# Patient Record
Sex: Female | Born: 1945
Health system: Southern US, Community
[De-identification: ages and names within clinical notes are randomized; demographics above are authoritative.]

## PROBLEM LIST (undated history)

## (undated) DIAGNOSIS — I447 Left bundle-branch block, unspecified: Secondary | ICD-10-CM

## (undated) DIAGNOSIS — I10 Essential (primary) hypertension: Secondary | ICD-10-CM

## (undated) DIAGNOSIS — K219 Gastro-esophageal reflux disease without esophagitis: Secondary | ICD-10-CM

## (undated) DIAGNOSIS — M1990 Unspecified osteoarthritis, unspecified site: Secondary | ICD-10-CM

## (undated) DIAGNOSIS — F419 Anxiety disorder, unspecified: Secondary | ICD-10-CM

## (undated) DIAGNOSIS — E119 Type 2 diabetes mellitus without complications: Secondary | ICD-10-CM

## (undated) DIAGNOSIS — M419 Scoliosis, unspecified: Secondary | ICD-10-CM

## (undated) DIAGNOSIS — G2581 Restless legs syndrome: Secondary | ICD-10-CM

## (undated) DIAGNOSIS — F329 Major depressive disorder, single episode, unspecified: Secondary | ICD-10-CM

## (undated) DIAGNOSIS — Z9889 Other specified postprocedural states: Secondary | ICD-10-CM

## (undated) DIAGNOSIS — Z780 Asymptomatic menopausal state: Secondary | ICD-10-CM

## (undated) DIAGNOSIS — E039 Hypothyroidism, unspecified: Secondary | ICD-10-CM

## (undated) DIAGNOSIS — I509 Heart failure, unspecified: Secondary | ICD-10-CM

## (undated) DIAGNOSIS — R112 Nausea with vomiting, unspecified: Secondary | ICD-10-CM

## (undated) DIAGNOSIS — R0600 Dyspnea, unspecified: Secondary | ICD-10-CM

## (undated) DIAGNOSIS — J189 Pneumonia, unspecified organism: Secondary | ICD-10-CM

## (undated) DIAGNOSIS — N6019 Diffuse cystic mastopathy of unspecified breast: Secondary | ICD-10-CM

## (undated) DIAGNOSIS — C801 Malignant (primary) neoplasm, unspecified: Secondary | ICD-10-CM

## (undated) DIAGNOSIS — F32A Depression, unspecified: Secondary | ICD-10-CM

## (undated) HISTORY — DX: Major depressive disorder, single episode, unspecified: F32.9

## (undated) HISTORY — DX: Gastro-esophageal reflux disease without esophagitis: K21.9

## (undated) HISTORY — DX: Essential (primary) hypertension: I10

## (undated) HISTORY — DX: Anxiety disorder, unspecified: F41.9

## (undated) HISTORY — DX: Type 2 diabetes mellitus without complications: E11.9

## (undated) HISTORY — DX: Left bundle-branch block, unspecified: I44.7

## (undated) HISTORY — PX: OTHER SURGICAL HISTORY: SHX169

## (undated) HISTORY — DX: Asymptomatic menopausal state: Z78.0

## (undated) HISTORY — DX: Depression, unspecified: F32.A

## (undated) HISTORY — DX: Unspecified osteoarthritis, unspecified site: M19.90

## (undated) HISTORY — DX: Scoliosis, unspecified: M41.9

## (undated) HISTORY — PX: CATARACT EXTRACTION: SUR2

## (undated) HISTORY — PX: EYE SURGERY: SHX253

## (undated) HISTORY — PX: BREAST BIOPSY: SHX20

## (undated) HISTORY — PX: TUBAL LIGATION: SHX77

## (undated) HISTORY — DX: Hypothyroidism, unspecified: E03.9

## (undated) HISTORY — PX: TONSILLECTOMY: SUR1361

## (undated) HISTORY — DX: Diffuse cystic mastopathy of unspecified breast: N60.19

## (undated) HISTORY — PX: ROTATOR CUFF REPAIR: SHX139

## (undated) HISTORY — PX: CHOLECYSTECTOMY: SHX55

## (undated) SURGERY — COLONOSCOPY
Anesthesia: Monitor Anesthesia Care

---

## 1978-10-21 HISTORY — PX: THYROIDECTOMY: SHX17

## 1999-10-22 HISTORY — PX: KNEE ARTHROPLASTY: SHX992

## 1999-11-01 ENCOUNTER — Encounter: Payer: Self-pay | Admitting: Specialist

## 1999-11-03 ENCOUNTER — Encounter: Payer: Self-pay | Admitting: Specialist

## 1999-11-03 ENCOUNTER — Ambulatory Visit (HOSPITAL_COMMUNITY): Admission: RE | Admit: 1999-11-03 | Discharge: 1999-11-03 | Payer: Self-pay | Admitting: Specialist

## 1999-11-09 ENCOUNTER — Inpatient Hospital Stay (HOSPITAL_COMMUNITY): Admission: RE | Admit: 1999-11-09 | Discharge: 1999-11-13 | Payer: Self-pay | Admitting: Specialist

## 2000-10-21 HISTORY — PX: KNEE ARTHROPLASTY: SHX992

## 2000-11-24 ENCOUNTER — Encounter: Payer: Self-pay | Admitting: Specialist

## 2000-11-28 ENCOUNTER — Inpatient Hospital Stay (HOSPITAL_COMMUNITY): Admission: RE | Admit: 2000-11-28 | Discharge: 2000-12-02 | Payer: Self-pay | Admitting: Specialist

## 2002-07-20 ENCOUNTER — Ambulatory Visit (HOSPITAL_BASED_OUTPATIENT_CLINIC_OR_DEPARTMENT_OTHER): Admission: RE | Admit: 2002-07-20 | Discharge: 2002-07-20 | Payer: Self-pay | Admitting: Family Medicine

## 2002-09-13 ENCOUNTER — Ambulatory Visit (HOSPITAL_BASED_OUTPATIENT_CLINIC_OR_DEPARTMENT_OTHER): Admission: RE | Admit: 2002-09-13 | Discharge: 2002-09-13 | Payer: Self-pay | Admitting: Family Medicine

## 2003-08-08 ENCOUNTER — Encounter: Payer: Self-pay | Admitting: Surgery

## 2003-08-08 ENCOUNTER — Ambulatory Visit (HOSPITAL_COMMUNITY): Admission: RE | Admit: 2003-08-08 | Discharge: 2003-08-08 | Payer: Self-pay | Admitting: Surgery

## 2007-03-11 ENCOUNTER — Ambulatory Visit: Payer: Self-pay | Admitting: Internal Medicine

## 2007-03-26 ENCOUNTER — Ambulatory Visit (HOSPITAL_COMMUNITY): Admission: RE | Admit: 2007-03-26 | Discharge: 2007-03-26 | Payer: Self-pay | Admitting: Internal Medicine

## 2007-03-26 ENCOUNTER — Encounter: Payer: Self-pay | Admitting: Internal Medicine

## 2007-03-26 ENCOUNTER — Ambulatory Visit: Payer: Self-pay | Admitting: Internal Medicine

## 2007-03-26 HISTORY — PX: COLONOSCOPY: SHX174

## 2007-04-29 ENCOUNTER — Ambulatory Visit: Payer: Self-pay | Admitting: Internal Medicine

## 2007-08-04 ENCOUNTER — Ambulatory Visit: Payer: Self-pay | Admitting: Internal Medicine

## 2009-10-17 ENCOUNTER — Ambulatory Visit: Payer: Self-pay | Admitting: Cardiology

## 2011-03-05 NOTE — Assessment & Plan Note (Signed)
NAME:  Grace Delacruz, SUPAN                CHART#:  [id number]   DATE:  04/29/2007                       DOB:  September 25, 1946   Follow up pan proctocolitis on recent colonoscopy.  A 19-month history of  diarrhea with some blood per rectum.  On March 26, 2007 she underwent  colonoscopy, she had pan proctocolitis with a normal appearing terminal  ileum.  She had several biopsies revealing a chronic nonspecific  colitis.  No granulomas were seen.  She did have a couple of polyps  removed.  They turned out to be adenomatous.  We started her on 2.4 gm  of Lialda daily on April 02, 2007.  She tells me the rectal bleeding has  subsided, but she continues to have diarrhea.  As she describes, she may  have as little as 1 bowel movement on a given day, which is somewhat  soft and poorly formed, and on a bad day she may have 5 stools after  lunch.  She has gained 4 pounds since she was originally seen here.  She  is not having any nausea or vomiting.  She has vague right-sided  abdominal pain intermittently in association with bowel dysfunction.  She has not had fever or chills.  MET 7 since she was last seen  indicated normal renal function.  Blood sugar was 115 on April 06, 2007.   CURRENT MEDICATIONS:  See updated listed.   ALLERGIES:  KEFLEX.   EXAMINATION:  She appears in no acute distress.  Weight 274.5.  Height 5 feet 6 inches.  Temperature 98.  BP 130/80.  Pulse 64.  SKIN:  Warm and dry.  ABDOMEN:  Obese.  Positive bowel sounds.  Soft.  Very minimal diffuse  tenderness to palpation.  No appreciable mass or organomegaly.   ASSESSMENT:  Pan proctocolitis seen on recent colonoscopy.  She has had  meager response to low-dose Lialda.  It is notable that this lady  apparently has not any stool studies along the way although 7 months in  to this process, and given endoscopic findings, I suspect she has  idiopathic inflammatory bowel disease, i.e., ulcerative colitis.  I  think we need to go ahead and do a  set of stool studies for completion  at this time.  Not mentioned above, she was taking Advil intermittently  when I saw her initially in consultation.  I have asked her to stop all  NSAIDS, and she has complied.  We will go ahead an ask her to bump her  Lialda up to full dose therapy, i.e., four 1.2-gm capsules daily, and  ask her to take an Imodium AD 2 mg just before she eats lunch to deal  with the symptoms of diarrhea.  We will follow up on her stool studies,  and make further recommendations in the very near future.  It certainly  is possible that underlying irritable bowel  syndrome or an element of diabetic visceral enteropathy could be  complicating the picture.  Further recommendations in the very near  future.       Jonathon Bellows, M.D.  Electronically Signed     RMR/MEDQ  D:  04/29/2007  T:  04/29/2007  Job:  [id number]   cc:   [name]

## 2011-03-05 NOTE — Op Note (Signed)
NAME:  Grace Delacruz, Grace Delacruz               ACCOUNT NO.:  [id number]   MEDICAL RECORD NO.:  [id number]          PATIENT TYPE:  AMB   LOCATION:  DAY                           FACILITY:  [facility]   PHYSICIAN:  R. Roetta Sessions, M.D. DATE OF BIRTH:  [birth year]   DATE OF PROCEDURE:  03/26/2007  DATE OF DISCHARGE:                               OPERATIVE REPORT   PROCEDURE:  Colonoscopy and segmental biopsy, snare polypectomy.   INDICATIONS FOR PROCEDURE:  65 year old Caucasian female with a 34-month  history of intermittent blood per rectum.  She states her stools have  flattened out become smaller in caliber and she has some vague bilateral  lower quadrant abdominal pain.  Last colonoscopy was reportedly negative  by Dr. Marcell Anger some 6 to 7 years ago.  There is no family history  colorectal neoplasia.  Colonoscopy is now being done.  This approach has  discussed the patient at length.  Potential risks, benefits and  alternatives have been reviewed, questions answered.  She is agreeable.  Please see the medical record.  O2 saturation, blood pressure, pulse and  respirations monitored through entirety  procedure.  Conscious sedation  of Versed 4 mg IV Demerol 100 mg IV in divided doses.   INSTRUMENT:  Pentax video chip system.   FINDINGS:  Digital rectal exam revealed no abnormalities.   ENDOSCOPIC FINDINGS:  The prep was marginal, thin coating of stool  throughout the colon made the exam more difficult.  Colon:  Colonic mucosa was surveyed from the rectosigmoid junction  through the left, transverse, right colon to area of appendiceal  orifice, ileocecal valve, cecum. These structures well seen photographed  for the record.  Terminal ileum was intubated to 10 cm.  From this level  scope was slowly withdrawn.  All previously mentioned  mucosal surfaces  were again seen.  The patient has scattered left-sided diverticula.  There was 7 mL polyp at splenic flexure.  It was removed with hot snare  cautery recovered through the scope.  There was also the elliptical  ulceration on the ileocecal valve which was biopsied separately.  The  terminal ileum mucosa appeared normal.  The entire colonic mucosa  appeared abnormal with patchy loss of the normal vascular pattern,  hyperemia and mucosal edema.  These changes extended all the way down to  the rectum.  Segmental biopsies of the transverse and descending  segments and the rectal mucosa were taken for histologic study.  There  was 2 mm diminutive polyp in the rectum which was cold biopsy/removed.  Thorough examination  rectal mucosa including retroflex view anal verge  demonstrated diffusely abnormal appearing rectal mucosa similar that  seen up in the colon.  Biopsies the rectal mucosa taken for histologic  study.  The patient tolerated the procedure well was reacted in  endoscopy.   IMPRESSION:  Diffuse changes of proctocolitis as described above,  diminutive rectal polyp, cold biopsy/removed, pedunculated polyp of the  splenic flexure hot snare removed.  Segmental biopsies the colon taken  of the ileocecal valve ulcer biopsied normal terminal ileum.   The patient has proctocolitis etiology  not well-defined at this time.  All this may be the onset inflammatory bowel disease.   RECOMMENDATIONS:  1. Will follow up on path and will then make further recommendations      in the very near future.  2. No aspirin or arthritis medication for the next 10 days.      Jonathon Bellows, M.D.  Electronically Signed     RMR/MEDQ  D:  03/26/2007  T:  03/26/2007  Job:  [id number]   cc:   [name]  Fax: [phone]

## 2011-03-05 NOTE — Consult Note (Signed)
NAME:  Grace Delacruz, Grace Delacruz               ACCOUNT NO.:  [id number]   MEDICAL RECORD NO.:  [id number]           PATIENT TYPE:  AMB   LOCATION:                                FACILITY:  [facility]   PHYSICIAN:  R. Roetta Sessions, M.D. DATE OF BIRTH:  Mar 15, 1946   DATE OF CONSULTATION:  03/11/2007  DATE OF DISCHARGE:                                 CONSULTATION   REFERRING PHYSICIAN:  [name], [place], [place].   REASON FOR CONSULTATION:  Hematochezia, change in bowel function.  Ms.  Grace Delacruz is a pleasant 65 year old Caucasian female followed  primarily by Dr. Dimas Aguas who comes over to see me complaining of a six  month history of intermittent blood per rectum.  She states she has gone  from what she would call a regular bowel movement to more flatter stools  intermixed with blood periodically.  She denies constipation or  diarrhea.  She has had some vague bilateral lower quadrant abdominal  pain not associated with stooling or eating.  It is good to know she had  a colonoscopy by Dr. Cleotis Nipper 6-7 years ago without significant  findings.  No family history of colorectal neoplasia.   PAST MEDICAL HISTORY:  1. Osteoarthritis.  2. Type 2 diabetes mellitus.  3. Hypertension.  4. Hyperlipidemia.  5. History of thyroid disease.  6. Hypothyroidism on supplementation.   PAST SURGICAL HISTORY:  1. Bilateral knee replacements, last 7 years ago.  2. Three breast biopsies for benign disease.  3. Cholecystectomy.  4. C-section.  5. Left leg vein removal.  6. Joint fusion foot.  7. Rotator cuff repair.   MEDICATIONS:  1. Metformin 500 mg daily.  2. Cozaar 100 mg daily.  3. Lasix 40 mg 2 tablets daily.  4. Effexor 75 mg daily.  5. Simvastatin 80 mg daily.  6. Metoprolol 100 mg daily.  7. Etodolac 400 mg daily.  8. Levoxyl 0.125 mg daily.  9. Multivitamin daily.  10.Calcium.  11.Vitamin D daily.  12.Osteomyelitis daily.  13.Fish oil daily.  14.Vitamin E daily.   ALLERGIES:  NO  KNOWN DRUG ALLERGIES   FAMILY HISTORY:  Mother is alive with hypertension.  Father died with  congestive heart failure and pneumonia at age 31.  There is no history  of chronic GI or liver illness.  No history GI neoplasia.   SOCIAL HISTORY:  Patient married, has one 16 year old son in good  health.  She works for [employer] in [place].  [place].  No tobacco, no  alcohol.  No illicit drugs.   REVIEW OF SYSTEMS:  No recent chest pain, dyspnea on exertion.  No  change in weight.  No fever or chills.  Occasional reflux symptoms but  no odynophagia, dysphagia, early satiety, nausea or vomiting.   PHYSICAL EXAMINATION:  GENERAL APPEARANCE:  A pleasant 65 year old lady  resting comfortably.  VITAL SIGNS:  Weight 270, height 5 feet 6, temperature 97.6, BP 126/64.  SKIN:  Warm and dry.  There is no jaundice or cutaneous stigmata of  chronic liver disease.  HEENT:  No scleral icterus.  NECK:  JVD is not prominent.  CHEST:  Lungs are clear to auscultation.  CARDIAC:  Regular rate and rhythm without murmur, gallop or rub.  BREASTS:  Examination is deferred.  ABDOMEN:  Obese, positive bowel sounds, soft, entirely nontender without  obvious mass or organomegaly.  EXTREMITIES:  She was 1 to 2+ ankle edema.  RECTAL:  Exam deferred to time of colonoscopy.   IMPRESSION:  Ms. Grace Delacruz is a pleasant 65 year old lady with a six  month history of change in bowel habits, change in stool shape and  hematochezia.  It is good to know she had a colonoscopy 7 years ago but  some time has elapsed since the last examination.   RECOMMENDATIONS:  I agree with Dr. Dimas Aguas that a colonoscopy needs to be  done now.  Potential risks, benefits and alternatives have been  reviewed, questions answered.  She is agreeable.   PLAN:  Perform colonoscopy at [facility] in the very near  future.  Further recommendations to follow.   I would  like to thank Dr. [name] for letting me see this nice lady  today.      Jonathon Bellows, M.D.  Electronically Signed     RMR/MEDQ  D:  03/11/2007  T:  03/11/2007  Job:  [id number]   cc:   [name]  Fax: [phone]

## 2011-03-05 NOTE — Assessment & Plan Note (Signed)
NAME:  Grace Delacruz, Grace Delacruz                CHART#:  [id number]   DATE:  08/04/2007                       DOB:  August 09, 1946   Followup pan proctocolitis.  Since last being seen, stool studies came  back all negative for infection.  Lactoferrin was positive.  We bumped  her Lialda up to 4.8 grams daily.  Overall, her GI symptoms have waned.  She has 1 to 2 nearly formed bowel movements daily and blood per rectum  has tapered off, tenesmus has improved.  When she does get the urge to  have a bowel movement though she has to get to the toilet quickly.  She  is really not using Imodium as she thinks she does not really need it  anymore.  She remains morbidly obese.  She tells me she has discussed  with Dr. [name] bariatric surgery and has asked my opinion about  that approach as she feels she has failed to achieve significant weight  loss by all other modalities.   She is not taking any further nonsteroidals, at my request.  She did  have a couple adenomas in her colon for which surveillance colonoscopy  has been recommended 5 years from now.   CURRENT MEDICATIONS:  See updated list.   ALLERGIES:  KEFLEX.   EXAM:  She looks well.  Weight 277, up 2.5 pounds, height 5 feet 6  inches, temp 98.1, BP 120/94, pulse 60.  ABDOMEN:  Obese, positive bowel sounds, soft, nontender, no obvious mass  or organomegaly.  She has 1+ pitting lower extremity edema.   ASSESSMENT:  Pan proctocolitis, gaining remission on full-dose Lialda.  Renal function was normal based on BMET four months ago.   RECOMMENDATIONS:  1. Will check a CBC, sed. rate and BMET today.  2. Continue Lialda 4.8 grams daily.  3. Avoid nonsteroidal agents.  Hopefully will continue to see clinical      additional improvement with chronic Lialda therapy.  We talked      about the slightly increased risk of colorectal neoplasia after 12      to 15 years of UC and the need for relatively close interval      colonoscopies.  4. As far as  bariatric surgery is concerned, I told Ms. Spargo that      if she is interested in pursuing this further (which I think is not      really a bad idea overall), I would favor a referral      to Dr. Lily Peer at [facility].  She would      like to think about this approach and we will discuss further when      she returns to see [place] in 3 months.       Jonathon Bellows, M.D.  Electronically Signed     RMR/MEDQ  D:  08/04/2007  T:  08/04/2007  Job:  [id number]   cc:   [name]

## 2011-03-08 NOTE — Op Note (Signed)
[facility]  Patient:    Grace Delacruz, Grace Delacruz                        MRN: [id number] Proc. Date: 11/28/00 Adm. Date:  [id number] Attending:  Erasmo Leventhal                           Operative Report  PREOPERATIVE DIAGNOSIS:  Left knee end-stage osteoarthritis.  POSTOPERATIVE DIAGNOSIS:  Left knee end-stage osteoarthritis.  OPERATION:  Left total knee arthroplasty.  SURGEON:  R. Valma Cava, M.D.  ASSISTANT:   Ralene Bathe, P.A.  ANESTHESIA:  Spinal followed by epidural.  ESTIMATED BLOOD LOSS:  Less than 100 cc.  DRAINS:  2 Hemovacs.  COMPLICATIONS:  None.  TOURNIQUET TIME:  83 minutes at 375 mmHg.  DISPOSITION:  PACU, stable.  OPERATIVE IMPLANTS:  Osteonics components.  Posterior stabilized.  Size 7 femur, size 7 tibia, 15 mm tibial insert, and a 26 mm polyethylene patella. All components cemented.  OPERATIVE DETAILS:   The patient was counselled in the holding area, correctly identified, IV then started and antibiotics given.  Chart was reviewed. The patient was taken to OR where spinal epidural anesthesia was administered. Foley catheter was placed utilizing sterile technique by OR circulating nurse. Left knee examined, 7 degree flexion contracture, flexed to 110 degrees.  The left lower extremity was elevated.  Prepped with DuraPrep, draped in sterile fashion, exsanguinated with Esmarch, tourniquet inflated to 375 mmHg.  Anterior midline incision was made through the skin and subcutaneous tissue. The patient has a history of varicose veins, and several large veins were identified and coagulated.  Medial and lateral soft tissue flaps were developed.  Skin flaps were sutured posteriorly.  Medial parapatellar arthrotomy was performed.  Medial soft tissue released on the proximal tibia. The knee was then flexed.  End-stage arthritic change, tricompartmental, bone-on-bone contact, and large osteophytes.  Cruciate ligaments  were resected.  Starting hole made to distal femur.  Intramedullary rod was gently placed. Based upon preoperative x-rays and calculations, we chose a 5 degree valgus cut and took a a 10 mm cut off the distal femur.  The distal femur was found to be a size #7.  Rotational marks were made.  Distal femur was cut to fit a size #7.  Proximal tibial was inspected.   Medial and lateral meniscal remnants were removed.  Osteophytes were removed.  Tibial eminence was resected.   Proximal tibia was found to be a size #7.  Intramedullary starting hole was made.  Stab wound was utilized.  Canal was irrigated until effluent was clear.  Intramedullary rod was gently placed.  We chose a 5 degree posterior slope and took a 10 mm cut based upon the lateral side off the proximal tibia.  Posteromedial and posterolateral femoral osteophytes were removed.  Geniculates were coagulated on the medial and lateral aspects of the tibia.   Femoral trochlea was prepared in standard fashion.  At this point in time with a size #7 femur and size #7 tibia, with a 12 mm polyethylene insert, we had excellent range of motion and soft tissue balance.  Tracking was excellent as was alignment.  Rotational marks were made on proximal tibia.  A delta keel was performed in standard fashion.  Patella was found to be over 25 mm in thickness.  It was a size 26.  It was reamed to a depth of 10 mm.  Locking  holes were made, and excess bone was removed.  At this point in time, all components were cemented into place using modern cement technique, size #7 tibia, size #7 femur, with a 26 mm patella. We then did tibial trials with 12 and 15 mm, and with a 15 mm tibial trial, we had excellent range of motion and soft tissue balance on flexion and extension.  Trial was removed.  Knee was irrigated, and the 15 mm insert was then placed.  She had excellent flexion and extension and varus valgus stability.  There was some lateral tracking of  the patella.  A lateral release was performed with electrocautery, giving anatomic patellofemoral tracking.  All joint areas irrigated during closure several times.  Two Hemovac drains were placed intra-articularly.  At this time, sequential layered closure was done with Vicryl, synovium Vicryl, arthrotomy Vicryl.  Sponge was then placed. Ace wrap was wrapped around the knee, and the tourniquet was deflated.  We wanted to make sure we got meticulous hemostasis in the subcutaneous area secondary to her varicose vein history.  After hemostasis obtained, subcutaneous tissue closed with Vicryl, skin closed with staples.  Sterile dressing was applied to the knee.  Ace wrap, ice pack, knee immobilizer applied to lower extremity.   There were no complications.  Sponge and needle counts were correct.  She was awakened and taken from the operating room to PACU in stable condition.  Also note that another gram of Ancef was given intravenously as the tourniquet deflated.  The decreased surgical time helped with refraction and etc.  Mrs. Shuford assisted throughout this case. DD:  11/28/00 TD:  11/29/00 Job: [id number]

## 2011-03-08 NOTE — H&P (Signed)
[facility]  Patient:    Grace Delacruz, Grace Delacruz                        MRN: [id number] Adm. Date:  11/28/00 Attending:  R. Valma Cava, M.D. Dictator:   Alexzandrew L. Julien Girt, P.A.-C. CC:         [name], M.D., [facility],  [address], [place], [place]. [address]   History and Physical  DATE OF BIRTH:  19-Aug-1946  CHIEF COMPLAINT:  Left knee pain.  HISTORY OF PRESENT ILLNESS:  The patient is a 65 year old female well known to the [facility] and Dr. Elvera Lennox. Valma Cava having previously undergone a right total knee replacement arthroplasty approximately a year ago. She has been doing quite well with her right total knee; however, she has had continued follow-up concerning her left knee pain. She is seen to have a left knee varus malalignment with severe end-stage bone on bone tricompartmental arthritis. She states the pain is unbearable especially on weightbearing and walking. She is very uncomfortable and also has night pain. It is felt she has reached a point that she would like to have something done. Due to the fact that she has benefited from undergoing the previous knee, it is felt she would be an excellent candidate for the left total knee replacement arthroplasty. The risks and benefits of the procedure have been discussed with the patient and she has elected to proceed with surgery.  ALLERGIES:  No known drug allergies.  CURRENT MEDICATIONS:  Synthroid 0.125 mg daily, prempro 0.625 mg daily, Prozac 10 mg daily, Prilosec 20 mg daily, atenolol 50 mg daily, Hyzaar 25 mg daily, Vioxx 25 mg daily, glucosamine, chondroitin, vitamin E 1000 IU daily. Caltrate 600 mg plus D daily and vitamin D.  PAST MEDICAL HISTORY:  Hypertension, hypothyroidism, reflux disease, affective disorder with depression and anxiety. Fibrocystic breast disease, scoliosis, degenerative joint disease, menopausal.  PAST SURGICAL HISTORY:   Right total knee replacement arthroplasty, January 2001. Cholecystectomy. Recent colonoscopy, December 2001. Varicose vein stripping. Foot fusion x 2. Cesarean section. Right knee arthroscopy, breast biopsy, thyroidectomy, tonsillectomy.  SOCIAL HISTORY:  The patient is married, currently employed at [employer]. She lives in a [place] home with eight steps entering but denies the use of tobacco products or alcohol products.  FAMILY HISTORY:  Mother living age 53 in good health with a recent hospitalization for a shoulder hemiarthroplasty. Father deceased age 62 with a history of heart failure, hypertension, arthritis, and diabetes.  REVIEW OF SYSTEMS:  GENERAL:  No fever, chills, night sweats. NEURO:  No seizure, syncope, paralysis. RESPIRATORY:  No shortness of breath, productive cough, hemoptysis.  CARDIOVASCULAR:  No chest pain, angina, or orthopnea. GI: No nausea, vomiting, diarrhea or constipation. GU: No dysuria, hematuria, or discharge. MUSCULOSKELETAL:  Pertinent to the left knee found in the history of present illness.  PHYSICAL EXAMINATION:  VITAL SIGNS:  Pulse 68, respirations 16, blood pressure 148/78.  GENERAL:  The patient is a 65 year old white female, well-nourished, well-developed, who appears to be slightly overweight in no acute distress. She is a large framed female. She is alert, oriented, cooperative, and appears to be her stated age in no acute distress.  HEENT:  Normocephalic, atraumatic. Pupils are round and reactive. Oropharynx is clear. EOMs are intact.  NECK:  Supple, no carotid bruits.  CHEST:  Clear to auscultation and percussion. No rhonchi or rales.  HEART:  Regular rate and rhythm  without murmur. S1, S2 noted. No rubs, thrills, or palpitations.  ABDOMEN:  Soft, round, slightly protuberant abdomen. Bowel sounds are present. No rebound or guarding. Nontender.  RECTAL/BREASTS/GENITALIA:  Not done not pertinent to present  illness.  EXTREMITIES:  Significant to that of the left lower extremity. She has passive range of motion of the left knee with positive crepitus, tenderness, palpation of the medial and lateral joint line. Motor function is intact throughout the left lower extremity.  IMPRESSION:  1. Left knee end-stage tricompartmental osteoarthritis.  2. Status post right total knee replacement arthroplasty.  3. Hypertension.  4. Hypothyroidism.  5. Reflux disease.  6. Effective disorder with depression and anxiety.  7. Fibrocystic breast disease.  8. History of scoliosis.  9. Degenerative joint disease. 10. Menopausal.  PLAN:  The patient will be admitted to [facility] to undergo a left total knee replacement arthroplasty. The surgery will be performed by Dr. Elvera Lennox. Valma Cava. The patient has been seen by Dr. [name] preoperatively and cleared for the stated procedure. Dr. Dimas Aguas will be notified of the admission. He will be consulted if we need any medical information or medical assistance with this patient throughout the hospital course. DD:  11/25/00 TD:  11/26/00 Job: [id number]

## 2011-03-08 NOTE — Discharge Summary (Signed)
[facility]  Patient:    Grace Delacruz, Grace Delacruz                        MRN: [id number] Adm. Date:  [id number] Disc. Date: [id number] Attending:  Erasmo Leventhal Dictator:   Druscilla Brownie. Shela Nevin, P.A. CC:         [name], M.D., [facility],  [place], [place]   Discharge Summary  ADMISSION DIAGNOSES:  1. Left knee end-stage tricompartmental osteoarthritis.  2. Status post right total knee replacement arthroplasty.  3. Hypertension.  4. Hypothyroidism.  5. Reflux disease.  6. Affective disorder with depression and anxiety.  7. Fibrocystic breast disease.  8. History of scoliosis.  9. Degenerative joint disease. 10. Menopausal.  DISCHARGE DIAGNOSES:  1. Left knee end-stage tricompartmental osteoarthritis.  2. Status post right total knee replacement arthroplasty.  3. Hypertension.  4. Hypothyroidism.  5. Reflux disease.  6. Affective disorder with depression and anxiety.  7. Fibrocystic breast disease.  8. History of scoliosis.  9. Degenerative joint disease. 10. Menopausal. 11. Mild postoperative anemia.  CONSULTATIONS:  None.  OPERATIONS:  On November 28, 2000, the patient underwent left total knee replacement arthroplasty, all three components cemented.  Ralene Bathe, P.A.C., assisted.  HISTORY OF PRESENT ILLNESS:  This 65 year old who successfully underwent right total knee replacement arthroplasty has had progressive deteriorating problems concerning her left knee.  She has done very well with the right and highly desires correction of the left knee.  She has developed a various malalignment and severe end-stage bone-on-bone disease with tricompartmental arthritis, both by examination and x-ray.  It was felt the patient would benefit with surgical intervention and was admitted for the above procedure.  HOSPITAL COURSE:  The patient tolerated the surgical procedure quite well. Hemovac drain was pulled on the first  postoperative day without any difficulty.  She was placed on Coumadin protocol postoperatively for prevention of DVT.  The patient had an epidural postoperatively.  This was removed on the second postoperative day, and she began using p.o. analgesics. Physical therapy worked with her in ambulating, full weightbearing.  It was felt the patient could be maintained in her home environment, and arrangements were made for discharge.  Throughout her hospitalization and on the day of discharge, the patient was afebrile, vital signs were stable.  Neurovascular was intact to the operative extremity.  The patient was anxious to get home and pick up on her total knee therapy, so we discharged her.  LABORATORY DATA:  In the hospital hematologically showed a CBC with differential on admission completely within normal limits.  Final hemoglobin was 10.4, hematocrit was 30.5.  No transfusions.  Blood chemistries essentially within normal limits.  Urinalysis was negative for urinary tract infection.  No electrocardiogram seen on this chart.  No chest x-ray seen on this chart.  CONDITION ON DISCHARGE:  Improved and stable.  PLAN:  The patient is discharged to her home.  Her Coumadin protocol will be handled by her family physician, Dr. [name].  He is at the [facility] in [place], [place].  She will have outpatient physical therapy at [facility] in [place], [place].  FOLLOW-UP:  She will return to the center two weeks after the date of surgery.  DISCHARGE MEDICATIONS: 1. She is to continue with home medications and diet. 2. Coumadin per Dr. Dimas Aguas. 3. Percocet 5 mg #50 1-2 q.4-6h. p.r.n. pain. 4. Tylenol p.r.n. 5. Robaxin #30 with a refill  1 q.6h. p.r.n. muscle spasm.  DISCHARGE INSTRUCTIONS:  Call if any problems. DD:  12/10/00 TD:  12/11/00 Job: [id number]

## 2011-04-10 ENCOUNTER — Encounter: Payer: Self-pay | Admitting: Physical Therapy

## 2012-03-18 ENCOUNTER — Encounter: Payer: Self-pay | Admitting: Internal Medicine

## 2012-06-02 DIAGNOSIS — M25579 Pain in unspecified ankle and joints of unspecified foot: Secondary | ICD-10-CM | POA: Diagnosis not present

## 2012-06-03 ENCOUNTER — Encounter: Payer: Self-pay | Admitting: Cardiology

## 2012-06-03 DIAGNOSIS — I10 Essential (primary) hypertension: Secondary | ICD-10-CM | POA: Diagnosis not present

## 2012-06-03 DIAGNOSIS — Z01818 Encounter for other preprocedural examination: Secondary | ICD-10-CM | POA: Diagnosis not present

## 2012-06-03 DIAGNOSIS — E119 Type 2 diabetes mellitus without complications: Secondary | ICD-10-CM | POA: Diagnosis not present

## 2012-06-03 DIAGNOSIS — M2040 Other hammer toe(s) (acquired), unspecified foot: Secondary | ICD-10-CM | POA: Diagnosis not present

## 2012-06-08 SURGERY — Surgical Case
Anesthesia: *Unknown

## 2012-06-10 ENCOUNTER — Other Ambulatory Visit: Payer: Self-pay | Admitting: *Deleted

## 2012-06-10 DIAGNOSIS — M629 Disorder of muscle, unspecified: Secondary | ICD-10-CM | POA: Diagnosis not present

## 2012-06-10 DIAGNOSIS — M6240 Contracture of muscle, unspecified site: Secondary | ICD-10-CM | POA: Diagnosis not present

## 2012-06-10 DIAGNOSIS — I447 Left bundle-branch block, unspecified: Secondary | ICD-10-CM

## 2012-06-12 ENCOUNTER — Ambulatory Visit (HOSPITAL_COMMUNITY): Payer: Medicare Other | Attending: Cardiology

## 2012-06-12 DIAGNOSIS — I447 Left bundle-branch block, unspecified: Secondary | ICD-10-CM | POA: Diagnosis not present

## 2012-06-12 DIAGNOSIS — I079 Rheumatic tricuspid valve disease, unspecified: Secondary | ICD-10-CM | POA: Insufficient documentation

## 2012-06-12 DIAGNOSIS — R9431 Abnormal electrocardiogram [ECG] [EKG]: Secondary | ICD-10-CM

## 2012-06-12 DIAGNOSIS — I059 Rheumatic mitral valve disease, unspecified: Secondary | ICD-10-CM | POA: Diagnosis not present

## 2012-06-12 NOTE — Progress Notes (Signed)
Echocardiogram performed.  

## 2012-06-15 ENCOUNTER — Ambulatory Visit (HOSPITAL_COMMUNITY): Payer: Medicare Other | Attending: Internal Medicine | Admitting: Radiology

## 2012-06-15 VITALS — BP 151/99 | Ht 66.0 in | Wt 235.0 lb

## 2012-06-15 DIAGNOSIS — R9431 Abnormal electrocardiogram [ECG] [EKG]: Secondary | ICD-10-CM

## 2012-06-15 DIAGNOSIS — R0989 Other specified symptoms and signs involving the circulatory and respiratory systems: Secondary | ICD-10-CM | POA: Diagnosis not present

## 2012-06-15 DIAGNOSIS — R0609 Other forms of dyspnea: Secondary | ICD-10-CM

## 2012-06-15 DIAGNOSIS — R002 Palpitations: Secondary | ICD-10-CM | POA: Diagnosis not present

## 2012-06-15 DIAGNOSIS — E119 Type 2 diabetes mellitus without complications: Secondary | ICD-10-CM | POA: Diagnosis not present

## 2012-06-15 DIAGNOSIS — I447 Left bundle-branch block, unspecified: Secondary | ICD-10-CM

## 2012-06-15 DIAGNOSIS — I10 Essential (primary) hypertension: Secondary | ICD-10-CM | POA: Diagnosis not present

## 2012-06-15 MED ORDER — TECHNETIUM TC 99M TETROFOSMIN IV KIT
10.0000 | PACK | Freq: Once | INTRAVENOUS | Status: AC | PRN
  Administered 2012-06-15: 10 via INTRAVENOUS

## 2012-06-15 MED ORDER — REGADENOSON 0.4 MG/5ML IV SOLN
0.4000 mg | Freq: Once | INTRAVENOUS | Status: AC
  Administered 2012-06-15: 0.4 mg via INTRAVENOUS

## 2012-06-15 MED ORDER — TECHNETIUM TC 99M TETROFOSMIN IV KIT
30.0000 | PACK | Freq: Once | INTRAVENOUS | Status: AC | PRN
  Administered 2012-06-15: 30 via INTRAVENOUS

## 2012-06-15 NOTE — Progress Notes (Signed)
[facility] SITE 3 NUCLEAR MED [address] [place] [address] [phone]  Cardiology Nuclear Med Study  Grace Delacruz is a 66 y.o. female     MRN : [id number]     DOB: [birth year]  Procedure Date: 06/15/2012  Nuclear Med Background Indication for Stress Test:  Evaluation for Ischemia and Pending Surgical Clearance: Left foot surgery- Dr. Leonides Grills History:  06/12/12 ECHO Cardiac Risk Factors: Hypertension and NIDDM  Symptoms:  DOE and Palpitations   Nuclear Pre-Procedure Caffeine/Decaff Intake:  None NPO After: 7:30am   Lungs:  clear O2 Sat: 97% on room air. IV 0.9% NS with Angio Cath:  20g  IV Site: R Antecubital  IV Started by:  Stanton Kidney, EMT-P  Chest Size (in):  4 Cup Size: C  Height: 5\' 6"  (1.676 m)  Weight:  235 lb (106.595 kg)  BMI:  Body mass index is 37.93 kg/(m^2). Tech Comments:  Metoprolol held > 24 hours, per patient.    Nuclear Med Study 1 or 2 day study: 1 day  Stress Test Type:  Eugenie Birks  Reading MD: Dietrich Pates, MD  Order Authorizing Provider:  S.McDowell  Resting Radionuclide: Technetium 3m Tetrofosmin  Resting Radionuclide Dose: 11.0 mCi   Stress Radionuclide:  Technetium 86m Tetrofosmin  Stress Radionuclide Dose: 33.0 mCi           Stress Protocol Rest HR: 62 Stress HR: 82  Rest BP: 151/99 Stress BP: 191/100  Exercise Time (min): n/a METS: n/a   Predicted Max HR: 155 bpm % Max HR: 52.9 bpm Rate Pressure Product: 21308   Dose of Adenosine (mg):  n/a Dose of Lexiscan: 0.4 mg  Dose of Atropine (mg): n/a Dose of Dobutamine: n/a mcg/kg/min (at max HR)  Stress Test Technologist: Milana Na, EMT-P  Nuclear Technologist:  Domenic Polite, CNMT     Rest Procedure:  Myocardial perfusion imaging was performed at rest 45 minutes following the intravenous administration of Technetium 81m Tetrofosmin. Rest ECG: NSR - Normal EKG  Stress Procedure:  The patient received IV Lexiscan 0.4 mg over 15-seconds.  Technetium 37m  Tetrofosmin injected at 30-seconds.  There were no significant changes, sob, chest tightness, headache, and lt. headed with Lexiscan.  Quantitative spect images were obtained after a 45 minute delay. Stress ECG: No significant change from baseline ECG  QPS Raw Data Images:  Soft tissue (diaphragm, breast) surround heart. Stress Images:  Normal homogeneous uptake in all areas of the myocardium. Rest Images:  Normal homogeneous uptake in all areas of the myocardium. Subtraction (SDS):  No evidence of ischemia. Transient Ischemic Dilatation (Normal <1.22):  1.03 Lung/Heart Ratio (Normal <0.45):  0.40  Quantitative Gated Spect Images QGS EDV:  112 ml QGS ESV:  41 ml  Impression Exercise Capacity:  Lexiscan with no exercise. BP Response:  Normal blood pressure response. Clinical Symptoms:  Mild chest pain/dyspnea. ECG Impression:  No significant ST segment change suggestive of ischemia. Comparison with Prior Nuclear Study: No prior study.  Overall Impression:  Normal stress nuclear study.  LV Ejection Fraction: 63%.  LV Wall Motion:  NL LV Function; NL Wall Motion

## 2012-06-16 ENCOUNTER — Telehealth: Payer: Self-pay | Admitting: *Deleted

## 2012-06-16 NOTE — Telephone Encounter (Signed)
Message copied by Tarri Fuller on Tue Jun 16, 2012 11:35 AM ------      Message from: Jonelle Sidle      Created: Tue Jun 16, 2012 10:24 AM       Stress test result was located elsewhere, results normal indicating no ischemia with normal LV function. Does not appear that I have seen this patient in the office. She has a visit scheduled with me in [place] later this week for preoperative clearance.

## 2012-06-16 NOTE — Telephone Encounter (Signed)
pt notified of stress test results and gave me verbal understanding and aslo aware of echo results as well

## 2012-06-19 ENCOUNTER — Encounter: Payer: Self-pay | Admitting: Cardiology

## 2012-06-19 ENCOUNTER — Ambulatory Visit (INDEPENDENT_AMBULATORY_CARE_PROVIDER_SITE_OTHER): Payer: Medicare Other | Admitting: Cardiology

## 2012-06-19 VITALS — BP 133/69 | HR 65 | Ht 66.0 in | Wt 242.0 lb

## 2012-06-19 DIAGNOSIS — E119 Type 2 diabetes mellitus without complications: Secondary | ICD-10-CM | POA: Insufficient documentation

## 2012-06-19 DIAGNOSIS — I10 Essential (primary) hypertension: Secondary | ICD-10-CM | POA: Diagnosis not present

## 2012-06-19 DIAGNOSIS — Z01818 Encounter for other preprocedural examination: Secondary | ICD-10-CM | POA: Diagnosis not present

## 2012-06-19 NOTE — Assessment & Plan Note (Signed)
Followed by Dr. Howard. 

## 2012-06-19 NOTE — Assessment & Plan Note (Signed)
Her blood pressure is reasonable today. No changes made. Keep followup with Dr. Dimas Aguas.

## 2012-06-19 NOTE — Patient Instructions (Addendum)
Your physician recommends that you schedule a follow-up appointment in: As needed  

## 2012-06-19 NOTE — Progress Notes (Signed)
Clinical Summary Grace Delacruz is a 66 y.o.female referred for preoperative evaluation by Dr. Lestine Box. Her history was reviewed. She underwent foot surgery under anesthesia following ankle block back on August 21 at the [facility]. I reviewed the hand written operative note indicating presence of a left bundle branch block pattern by telemetry and reportedly ECG, neither available for review. Surgery was canceled due to this, although it sounds as if the patient was otherwise stable. The case was discussed with Dr. Elease Hashimoto by phone who recommended subsequent outpatient evaluation.  Ms. Obie denies any personal history of CAD or myocardial nfarction. I do have an ECG from 8/14 done at her primary care physician's office showing sinus rhythm with leftward axis, decreased R wave progression, normal QRS. She reports no history of arrhythmia or syncope. No history of exertional chest pain or unusual shortness of breath.  She was referred for an echocardiogram done on 8/23 indicating moderate LVH with LVEF of 55-65%, grade 1 diastolic dysfunction, paradoxical septal motion, mild left atrial enlargement. No major abnormalities. Lexiscan Myoview done on 8/26 reports no significant ST segment changes and overall normal perfusion, no clear evidence of ischemia. I reviewed these results with the patient her husband today.   No Known Allergies  Current Outpatient Prescriptions  Medication Sig Dispense Refill  . Ascorbic Acid (VITAMIN C PO) Take by mouth daily.      Marland Kitchen aspirin 325 MG tablet Take 650 mg by mouth 2 (two) times daily.      Marland Kitchen estrogen, conjugated,-medroxyprogesterone (PREMPRO) 0.625-2.5 MG per tablet Take 1 tablet by mouth daily.      Marland Kitchen FLUoxetine (PROZAC) 20 MG capsule Take 20 mg by mouth daily.      . furosemide (LASIX) 40 MG tablet Take 40 mg by mouth daily.      Marland Kitchen levothyroxine (SYNTHROID, LEVOTHROID) 112 MCG tablet Take 112 mcg by mouth daily.      Marland Kitchen losartan (COZAAR) 100  MG tablet Take 100 mg by mouth daily.      . metFORMIN (GLUCOPHAGE) 500 MG tablet Take 500 mg by mouth 2 (two) times daily with a meal.      . metoprolol succinate (TOPROL-XL) 100 MG 24 hr tablet Take 100 mg by mouth daily. Take with or immediately following a meal.      . simvastatin (ZOCOR) 80 MG tablet Take 80 mg by mouth at bedtime.        Past Medical History  Diagnosis Date  . Osteoarthritis   . Essential hypertension, benign   . Type 2 diabetes mellitus   . Hypothyroidism   . Esophageal reflux   . Depression   . Anxiety   . Fibrocystic breast disease   . Scoliosis   . DJD (degenerative joint disease)   . Menopause     Past Surgical History  Procedure Date  . Knee arthroplasty 2001    right  . Knee arthroplasty 2002    left  . Cesarean section   . Cholecystectomy   . Left leg vein resection   . Joint fusion-foot     x2  . Rotator cuff repair   . Breast biopsy   . Thyroidectomy   . Tonsillectomy     Family History  Problem Relation Age of Onset  . Hypertension Mother   . Heart failure Father     Died with pneumonia  . Hypertension Father   . Arthritis Father   . Diabetes Father     Social History  Ms. Dado reports that she has never smoked. She does not have any smokeless tobacco history on file. Ms. Winkel reports that she does not drink alcohol.  Review of Systems Ambulation limited, nonweightbearing on the left. At baseline she reports no angina, has NYHA class 1-2 dyspnea, no palpitations or syncope. No history of cardiac arrhythmia. No reported bleeding episodes. Reports compliance with her medications. Otherwise negative.  Physical Examination Filed Vitals:   06/19/12 0928  BP: 133/69  Pulse: 65   Overweight woman in no acute distress. HEENT: Conjunctiva and lids normal, oropharynx clear. Neck: Supple, no elevated JVP or carotid bruits, no thyromegaly. Lungs: Clear to auscultation, nonlabored breathing at rest. Cardiac: Regular rate and  rhythm, no S3 or significant systolic murmur, no pericardial rub. Abdomen: Soft, nontender, bowel sounds present, no guarding or rebound. Extremities: No pitting edema, distal pulses 2+. Left foot dressed. Skin: Warm and dry. Musculoskeletal: No kyphosis. Neuropsychiatric: Alert and oriented x3, affect grossly appropriate.   Problem List and Plan   Preoperative evaluation to rule out surgical contraindication No personal history of CAD, myocardial infarction, or CHF. No angina or unusual breathlessness at baseline. Recent reported left bundle branch block seen during anesthesia is of uncertain significance, could have been heart rate-related. She is on high-dose beta blocker at baseline and has tolerated this well. Subsequent ischemic evaluation is reassuring including documentation of normal LV function and no frank ischemia. No further cardiac testing is planned at this point, and she should be able to proceed with completion of her left foot surgery at an acceptable perioperative cardiac risk. May be worth considering doing this in the inpatient setting for closer monitoring and followup if needed. Our service is available to see her in consultation if the need arises.  Essential hypertension, benign Her blood pressure is reasonable today. No changes made. Keep followup with Dr. Dimas Aguas.  Type 2 diabetes mellitus Followed by Dr. Dimas Aguas.    Jonelle Sidle, M.D., F.A.C.C.

## 2012-06-19 NOTE — Assessment & Plan Note (Signed)
No personal history of CAD, myocardial infarction, or CHF. No angina or unusual breathlessness at baseline. Recent reported left bundle branch block seen during anesthesia is of uncertain significance, could have been heart rate-related. She is on high-dose beta blocker at baseline and has tolerated this well. Subsequent ischemic evaluation is reassuring including documentation of normal LV function and no frank ischemia. No further cardiac testing is planned at this point, and she should be able to proceed with completion of her left foot surgery at an acceptable perioperative cardiac risk. May be worth considering doing this in the inpatient setting for closer monitoring and followup if needed. Our service is available to see her in consultation if the need arises.

## 2012-06-29 ENCOUNTER — Ambulatory Visit (HOSPITAL_COMMUNITY): Admission: RE | Admit: 2012-06-29 | Payer: Medicare Other | Source: Ambulatory Visit

## 2012-06-29 ENCOUNTER — Encounter (HOSPITAL_COMMUNITY): Payer: Self-pay

## 2012-06-29 ENCOUNTER — Encounter (HOSPITAL_COMMUNITY)
Admission: RE | Admit: 2012-06-29 | Discharge: 2012-06-29 | Disposition: A | Discharge status: Home / Self Care | Payer: Medicare Other | Source: Ambulatory Visit | Attending: Orthopedic Surgery | Admitting: Orthopedic Surgery

## 2012-06-29 DIAGNOSIS — Z01812 Encounter for preprocedural laboratory examination: Secondary | ICD-10-CM | POA: Diagnosis not present

## 2012-06-29 DIAGNOSIS — G4730 Sleep apnea, unspecified: Secondary | ICD-10-CM | POA: Diagnosis not present

## 2012-06-29 DIAGNOSIS — M2010 Hallux valgus (acquired), unspecified foot: Secondary | ICD-10-CM | POA: Diagnosis not present

## 2012-06-29 DIAGNOSIS — Z79899 Other long term (current) drug therapy: Secondary | ICD-10-CM | POA: Diagnosis not present

## 2012-06-29 DIAGNOSIS — M2040 Other hammer toe(s) (acquired), unspecified foot: Secondary | ICD-10-CM | POA: Diagnosis not present

## 2012-06-29 DIAGNOSIS — I10 Essential (primary) hypertension: Secondary | ICD-10-CM | POA: Diagnosis not present

## 2012-06-29 DIAGNOSIS — Q66219 Congenital metatarsus primus varus, unspecified foot: Secondary | ICD-10-CM | POA: Diagnosis not present

## 2012-06-29 DIAGNOSIS — M7750 Other enthesopathy of unspecified foot: Secondary | ICD-10-CM | POA: Diagnosis not present

## 2012-06-29 DIAGNOSIS — K219 Gastro-esophageal reflux disease without esophagitis: Secondary | ICD-10-CM | POA: Diagnosis not present

## 2012-06-29 DIAGNOSIS — E119 Type 2 diabetes mellitus without complications: Secondary | ICD-10-CM | POA: Diagnosis not present

## 2012-06-29 LAB — BASIC METABOLIC PANEL
Chloride: 105 mEq/L (ref 96–112)
Creatinine, Ser: 0.81 mg/dL (ref 0.50–1.10)
GFR calc Af Amer: 86 mL/min — ABNORMAL LOW (ref >90)
Potassium: 3.5 mEq/L (ref 3.5–5.1)
Sodium: 141 mEq/L (ref 135–145)

## 2012-06-29 LAB — SURGICAL PCR SCREEN
MRSA, PCR: NEGATIVE
Staphylococcus aureus: POSITIVE — AB

## 2012-06-29 LAB — CBC
Platelets: 263 10*3/uL (ref 150–400)
RDW: 13 % (ref 11.5–15.5)
WBC: 8.5 10*3/uL (ref 4.0–10.5)

## 2012-06-29 NOTE — Pre-Procedure Instructions (Addendum)
PT HAS EKG REPORT IN EPIC FROM DR. HOWARD HER PCP-AND REVIEWED BY DR. MCDOWELL-[facility].  PT HAS CARDIOLOGY OFFICE NOTE 06/19/12 WITH CLEARANCE FOR FOOT SURGERY,  NUCLEAR STRESS TEST REPORT 06/17/12 AND ECHO REPORT 06/12/12--ALL REPORTS IN EPIC.   PT HAS CXR REPORT 06/03/12 FROM [facility] ON HER CHART. CBC, BMET WERE DONE TODAY AT [facility]. PREOP INSTRUCTIONS WERE DISCUSSED WITH PT USING TEACH BACK METHOD. DR. ROSE - ANESTHESIOLOGIST-TALKED WITH PT PREOP--AND COPIES OF PROGRSS NOTE, ANESTHESIA RECORD FROM [facility] ON PT'S CHART FROM HER SURGERY THERE ON 06/10/12. PT IS ALLERGIC TO HIBICLENS--SO SHE WAS NOT GIVEN HIBICLENS TO SHOWER WITH PREOP.

## 2012-06-29 NOTE — Patient Instructions (Addendum)
YOUR SURGERY IS SCHEDULED ON:  WED  9/11  AT 1:00 PM  REPORT TO [facility] AT: 11:00 AM      PHONE # FOR SHORT STAY IS [phone]  DO NOT EAT ANYTHING AFTER MIDNIGHT THE NIGHT BEFORE YOUR SURGERY.    NO FOOD, NO CHEWING GUM, NO MINTS, NO CANDIES, NO CHEWING TOBACCO. YOU MAY HAVE CLEAR LIQUIDS TO DRINK FROM MIDNIGHT UNTIL 7:00 AM DAY OF SURGERY -- LIKE WATER, SODA.       NOTHING TO DRINK AFTER 7:00 AM DAY OF SURGERY.  PLEASE TAKE THE FOLLOWING MEDICATIONS THE AM OF YOUR SURGERY WITH A FEW SIPS OF WATER:   PREMPRO, FLUOXETINE, LEVOTHYROXINE, METOPROLOL      IF YOU ARE DIABETIC:  DO NOT TAKE ANY DIABETIC MEDICATIONS THE AM OF YOUR SURGERY.  IF YOU TAKE INSULIN IN THE EVENINGS--PLEASE ONLY TAKE 1/2 NORMAL EVENING DOSE THE NIGHT BEFORE YOUR SURGERY.  NO INSULIN THE AM OF YOUR SURGERY.   DO NOT BRING VALUABLES, MONEY, CREDIT CARDS.  CONTACT LENS, DENTURES / PARTIALS, GLASSES SHOULD NOT BE WORN TO SURGERY AND IN MOST CASES-HEARING AIDS WILL NEED TO BE REMOVED.  BRING YOUR GLASSES CASE, ANY EQUIPMENT NEEDED FOR YOUR CONTACT LENS. FOR PATIENTS ADMITTED TO THE HOSPITAL--CHECK OUT TIME THE DAY OF DISCHARGE IS 11:00 AM.  ALL INPATIENT ROOMS ARE PRIVATE - WITH BATHROOM, TELEPHONE, TELEVISION AND WIFI INTERNET. IF YOU ARE BEING DISCHARGED THE SAME DAY OF YOUR SURGERY--YOU CAN NOT DRIVE YOURSELF HOME--AND SHOULD NOT GO HOME ALONE BY TAXI OR BUS.  NO DRIVING OR OPERATING MACHINERY FOR 24 HOURS FOLLOWING ANESTHESIA / PAIN MEDICATIONS.                              PLEASE READ OVER ANY  FACT SHEETS THAT YOU WERE GIVEN: MRSA INFORMATION

## 2012-07-01 ENCOUNTER — Encounter (HOSPITAL_COMMUNITY): Payer: Self-pay | Admitting: *Deleted

## 2012-07-01 ENCOUNTER — Encounter (HOSPITAL_COMMUNITY): Payer: Self-pay | Admitting: Anesthesiology

## 2012-07-01 ENCOUNTER — Ambulatory Visit (HOSPITAL_BASED_OUTPATIENT_CLINIC_OR_DEPARTMENT_OTHER): Admission: RE | Admit: 2012-07-01 | Payer: Medicare Other | Source: Ambulatory Visit | Admitting: Orthopedic Surgery

## 2012-07-01 ENCOUNTER — Ambulatory Visit: Admit: 2012-07-01 | Payer: Self-pay | Admitting: Orthopedic Surgery

## 2012-07-01 ENCOUNTER — Ambulatory Visit (HOSPITAL_COMMUNITY)
Admission: RE | Admit: 2012-07-01 | Discharge: 2012-07-01 | Disposition: A | Discharge status: Home / Self Care | Payer: Medicare Other | Source: Ambulatory Visit | Attending: Orthopedic Surgery | Admitting: Orthopedic Surgery

## 2012-07-01 ENCOUNTER — Encounter (HOSPITAL_COMMUNITY)
Admission: RE | Disposition: A | Discharge status: Home / Self Care | Payer: Self-pay | Source: Ambulatory Visit | Attending: Orthopedic Surgery

## 2012-07-01 ENCOUNTER — Ambulatory Visit (HOSPITAL_COMMUNITY): Payer: Medicare Other | Admitting: Anesthesiology

## 2012-07-01 ENCOUNTER — Encounter (HOSPITAL_BASED_OUTPATIENT_CLINIC_OR_DEPARTMENT_OTHER): Admission: RE | Payer: Self-pay | Source: Ambulatory Visit

## 2012-07-01 DIAGNOSIS — M25579 Pain in unspecified ankle and joints of unspecified foot: Secondary | ICD-10-CM

## 2012-07-01 DIAGNOSIS — Q66219 Congenital metatarsus primus varus, unspecified foot: Secondary | ICD-10-CM | POA: Insufficient documentation

## 2012-07-01 DIAGNOSIS — M2010 Hallux valgus (acquired), unspecified foot: Secondary | ICD-10-CM | POA: Diagnosis not present

## 2012-07-01 DIAGNOSIS — G4730 Sleep apnea, unspecified: Secondary | ICD-10-CM | POA: Insufficient documentation

## 2012-07-01 DIAGNOSIS — M7750 Other enthesopathy of unspecified foot: Secondary | ICD-10-CM | POA: Diagnosis not present

## 2012-07-01 DIAGNOSIS — M2140 Flat foot [pes planus] (acquired), unspecified foot: Secondary | ICD-10-CM | POA: Diagnosis not present

## 2012-07-01 DIAGNOSIS — M2020 Hallux rigidus, unspecified foot: Secondary | ICD-10-CM | POA: Diagnosis not present

## 2012-07-01 DIAGNOSIS — M19079 Primary osteoarthritis, unspecified ankle and foot: Secondary | ICD-10-CM | POA: Diagnosis not present

## 2012-07-01 DIAGNOSIS — M24573 Contracture, unspecified ankle: Secondary | ICD-10-CM | POA: Diagnosis not present

## 2012-07-01 DIAGNOSIS — K219 Gastro-esophageal reflux disease without esophagitis: Secondary | ICD-10-CM | POA: Insufficient documentation

## 2012-07-01 DIAGNOSIS — Z79899 Other long term (current) drug therapy: Secondary | ICD-10-CM | POA: Insufficient documentation

## 2012-07-01 DIAGNOSIS — M24576 Contracture, unspecified foot: Secondary | ICD-10-CM | POA: Diagnosis not present

## 2012-07-01 DIAGNOSIS — E119 Type 2 diabetes mellitus without complications: Secondary | ICD-10-CM | POA: Insufficient documentation

## 2012-07-01 DIAGNOSIS — M6240 Contracture of muscle, unspecified site: Secondary | ICD-10-CM | POA: Diagnosis not present

## 2012-07-01 DIAGNOSIS — Z01812 Encounter for preprocedural laboratory examination: Secondary | ICD-10-CM | POA: Insufficient documentation

## 2012-07-01 DIAGNOSIS — M12279 Villonodular synovitis (pigmented), unspecified ankle and foot: Secondary | ICD-10-CM | POA: Diagnosis not present

## 2012-07-01 DIAGNOSIS — M2040 Other hammer toe(s) (acquired), unspecified foot: Secondary | ICD-10-CM | POA: Diagnosis not present

## 2012-07-01 DIAGNOSIS — I10 Essential (primary) hypertension: Secondary | ICD-10-CM | POA: Insufficient documentation

## 2012-07-01 LAB — GLUCOSE, CAPILLARY

## 2012-07-01 SURGERY — TARSAL METATARSAL FUSION WITH WEIL OSTEOTOMY
Anesthesia: General | Site: Foot | Laterality: Left | Wound class: Clean

## 2012-07-01 SURGERY — RECONSTRUCTION, ANKLE
Anesthesia: Choice | Laterality: Left

## 2012-07-01 SURGERY — RECONSTRUCTION, ANKLE
Anesthesia: Choice | Site: Eye | Laterality: Left

## 2012-07-01 SURGERY — Surgical Case
Anesthesia: *Unknown

## 2012-07-01 MED ORDER — BUPIVACAINE HCL (PF) 0.5 % IJ SOLN
INTRAMUSCULAR | Status: AC
  Filled 2012-07-01: qty 30

## 2012-07-01 MED ORDER — OXYCODONE HCL 5 MG/5ML PO SOLN
5.0000 mg | Freq: Once | ORAL | Status: AC | PRN
  Filled 2012-07-01: qty 5

## 2012-07-01 MED ORDER — SODIUM CHLORIDE 0.9 % IV SOLN: INTRAVENOUS | Status: DC

## 2012-07-01 MED ORDER — ACETAMINOPHEN 10 MG/ML IV SOLN: 1000.0000 mg | Freq: Once | INTRAVENOUS | Status: DC | PRN

## 2012-07-01 MED ORDER — PROPOFOL 10 MG/ML IV EMUL
INTRAVENOUS | Status: DC | PRN
  Administered 2012-07-01: 25 mg via INTRAVENOUS
  Administered 2012-07-01: 150 mg via INTRAVENOUS

## 2012-07-01 MED ORDER — METHOCARBAMOL 500 MG PO TABS: 500.0000 mg | ORAL_TABLET | Freq: Three times a day (TID) | ORAL | Status: DC | PRN

## 2012-07-01 MED ORDER — METHOCARBAMOL 500 MG PO TABS: 500.0000 mg | ORAL_TABLET | Freq: Three times a day (TID) | ORAL | Status: AC

## 2012-07-01 MED ORDER — VANCOMYCIN HCL 1000 MG IV SOLR
1500.0000 mg | INTRAVENOUS | Status: AC
  Administered 2012-07-01: 1500 mg via INTRAVENOUS
  Filled 2012-07-01: qty 1500

## 2012-07-01 MED ORDER — HYDROMORPHONE HCL PF 1 MG/ML IJ SOLN: 0.2500 mg | INTRAMUSCULAR | Status: DC | PRN

## 2012-07-01 MED ORDER — GLYCOPYRROLATE 0.2 MG/ML IJ SOLN
INTRAMUSCULAR | Status: DC | PRN
  Administered 2012-07-01 (×2): 0.2 mg via INTRAVENOUS

## 2012-07-01 MED ORDER — PROMETHAZINE HCL 25 MG/ML IJ SOLN: 6.2500 mg | INTRAMUSCULAR | Status: DC | PRN

## 2012-07-01 MED ORDER — FENTANYL CITRATE 0.05 MG/ML IJ SOLN
INTRAMUSCULAR | Status: DC | PRN
  Administered 2012-07-01: 100 ug via INTRAVENOUS
  Administered 2012-07-01 (×3): 50 ug via INTRAVENOUS
  Administered 2012-07-01: 100 ug via INTRAVENOUS

## 2012-07-01 MED ORDER — LACTATED RINGERS IV SOLN
INTRAVENOUS | Status: DC | PRN
  Administered 2012-07-01: 14:00:00 via INTRAVENOUS

## 2012-07-01 MED ORDER — BUPIVACAINE HCL (PF) 0.5 % IJ SOLN
INTRAMUSCULAR | Status: DC | PRN
  Administered 2012-07-01: 10 mL

## 2012-07-01 MED ORDER — HYDROMORPHONE HCL PF 1 MG/ML IJ SOLN
INTRAMUSCULAR | Status: DC | PRN
  Administered 2012-07-01 (×2): 0.5 mg via INTRAVENOUS
  Administered 2012-07-01: 1 mg via INTRAVENOUS

## 2012-07-01 MED ORDER — MEPERIDINE HCL 50 MG/ML IJ SOLN: 6.2500 mg | INTRAMUSCULAR | Status: DC | PRN

## 2012-07-01 MED ORDER — EPHEDRINE SULFATE 50 MG/ML IJ SOLN
INTRAMUSCULAR | Status: DC | PRN
  Administered 2012-07-01 (×2): 5 mg via INTRAVENOUS

## 2012-07-01 MED ORDER — SCOPOLAMINE 1 MG/3DAYS TD PT72
MEDICATED_PATCH | TRANSDERMAL | Status: AC
  Filled 2012-07-01: qty 1

## 2012-07-01 MED ORDER — OXYCODONE-ACETAMINOPHEN 5-325 MG PO TABS: 1.0000 | ORAL_TABLET | ORAL | Status: AC | PRN

## 2012-07-01 MED ORDER — OXYCODONE HCL 5 MG PO TABS
5.0000 mg | ORAL_TABLET | Freq: Once | ORAL | Status: AC | PRN
  Administered 2012-07-01: 5 mg via ORAL
  Filled 2012-07-01: qty 1

## 2012-07-01 MED ORDER — POVIDONE-IODINE 7.5 % EX SOLN: Freq: Once | CUTANEOUS | Status: DC

## 2012-07-01 MED ORDER — ONDANSETRON HCL 4 MG/2ML IJ SOLN
INTRAMUSCULAR | Status: DC | PRN
  Administered 2012-07-01: 4 mg via INTRAVENOUS

## 2012-07-01 MED ORDER — LIDOCAINE HCL (CARDIAC) 20 MG/ML IV SOLN
INTRAVENOUS | Status: DC | PRN
  Administered 2012-07-01: 100 mg via INTRAVENOUS

## 2012-07-01 MED ORDER — SUCCINYLCHOLINE CHLORIDE 20 MG/ML IJ SOLN
INTRAMUSCULAR | Status: DC | PRN
  Administered 2012-07-01: 100 mg via INTRAVENOUS

## 2012-07-01 MED ORDER — MIDAZOLAM HCL 5 MG/5ML IJ SOLN
INTRAMUSCULAR | Status: DC | PRN
  Administered 2012-07-01: 2 mg via INTRAVENOUS

## 2012-07-01 SURGICAL SUPPLY — 73 items
BANDAGE ELASTIC 4 VELCRO ST LF (GAUZE/BANDAGES/DRESSINGS) ×1 IMPLANT
BANDAGE ELASTIC 6 VELCRO ST LF (GAUZE/BANDAGES/DRESSINGS) ×2 IMPLANT
BIT DRILL PROS QC 1.5 (BIT) ×2 IMPLANT
BLADE OSC/SAG .038X5.5 CUT EDG (BLADE) IMPLANT
BLADE OSCILLATING/SAGITTAL (BLADE) ×4
BLADE SURG 15 STRL LF DISP TIS (BLADE) ×4 IMPLANT
BLADE SURG 15 STRL SS (BLADE) ×8
BLADE SURG SZ11 CARB STEEL (BLADE) IMPLANT
BLADE SW THK.38XMED LNG THN (BLADE) IMPLANT
BNDG COHESIVE 4X5 TAN STRL (GAUZE/BANDAGES/DRESSINGS) ×1 IMPLANT
BRUSH SCRUB EZ PLAIN DRY (MISCELLANEOUS) ×2 IMPLANT
BUR EGG DIAMOND 4.0 (BURR) ×1 IMPLANT
BUR EGG ELITE 4.0 (BURR) ×2 IMPLANT
CANISTER SUCTION 1200CC (MISCELLANEOUS) ×2 IMPLANT
CLOTH BEACON ORANGE TIMEOUT ST (SAFETY) ×2 IMPLANT
COTTON STERILE ROLL (GAUZE/BANDAGES/DRESSINGS) ×2 IMPLANT
CUFF TOURN SGL QUICK 34 (TOURNIQUET CUFF) ×2
CUFF TRNQT CYL 34X4X40X1 (TOURNIQUET CUFF) ×1 IMPLANT
DRAPE LG THREE QUARTER DISP (DRAPES) ×4 IMPLANT
DRAPE OEC MINIVIEW 54X84 (DRAPES) ×2 IMPLANT
DRAPE SURG 17X11 SM STRL (DRAPES) ×2 IMPLANT
DRSG ADAPTIC 3X8 NADH LF (GAUZE/BANDAGES/DRESSINGS) ×1 IMPLANT
DRSG PAD ABDOMINAL 8X10 ST (GAUZE/BANDAGES/DRESSINGS) ×2 IMPLANT
DURA STEPPER LG (CAST SUPPLIES) IMPLANT
DURA STEPPER MED (CAST SUPPLIES) IMPLANT
DURA STEPPER SML (CAST SUPPLIES) IMPLANT
ELECT REM PT RETURN 9FT ADLT (ELECTROSURGICAL) ×2
ELECTRODE REM PT RTRN 9FT ADLT (ELECTROSURGICAL) ×1 IMPLANT
GAUZE SPONGE 4X4 16PLY XRAY LF (GAUZE/BANDAGES/DRESSINGS) IMPLANT
GAUZE XEROFORM 1X8 LF (GAUZE/BANDAGES/DRESSINGS) IMPLANT
GAUZE XEROFORM 5X9 LF (GAUZE/BANDAGES/DRESSINGS) ×1 IMPLANT
GLOVE BIO SURGEON STRL SZ8 (GLOVE) ×2 IMPLANT
GLOVE BIOGEL PI IND STRL 8 (GLOVE) ×2 IMPLANT
GLOVE BIOGEL PI INDICATOR 8 (GLOVE) ×2
GLOVE SURG SS PI 8.0 STRL IVOR (GLOVE) ×2 IMPLANT
GOWN BRE IMP PREV XXLGXLNG (GOWN DISPOSABLE) ×2 IMPLANT
GOWN PREVENTION PLUS XLARGE (GOWN DISPOSABLE) ×2 IMPLANT
K-WIRE 2.0X150M (WIRE) ×2
K-WIRE CAPS STERILE WHITE .045 (WIRE) ×1 IMPLANT
KIT BASIN OR (CUSTOM PROCEDURE TRAY) ×2 IMPLANT
KWIRE 2.0X150M (WIRE) IMPLANT
KWIRE 4.0 X .045IN (WIRE) ×2 IMPLANT
NDL MA TROC 1/2 (NEEDLE) IMPLANT
NEEDLE HYPO 22GX1.5 SAFETY (NEEDLE) IMPLANT
NEEDLE MA TROC 1/2 (NEEDLE) IMPLANT
NS IRRIG 1000ML POUR BTL (IV SOLUTION) ×2 IMPLANT
PACK LOWER EXTREMITY WL (CUSTOM PROCEDURE TRAY) ×2 IMPLANT
PAD CAST 4YDX4 CTTN HI CHSV (CAST SUPPLIES) IMPLANT
PADDING CAST ABS 4INX4YD NS (CAST SUPPLIES) ×1
PADDING CAST ABS COTTON 4X4 ST (CAST SUPPLIES) ×1 IMPLANT
PADDING CAST COTTON 4X4 STRL (CAST SUPPLIES)
SCREW CORTEX 1.5X12 (Screw) ×2 IMPLANT
SCREW CORTEX 3.5 36MM (Screw) ×1 IMPLANT
SCREW CORTEX 3.5 38MM (Screw) ×1 IMPLANT
SCREW CORTEX 3.5 45MM (Screw) ×1 IMPLANT
SCREW LOCK CORT ST 3.5X36 (Screw) IMPLANT
SCREW LOCK CORT ST 3.5X38 (Screw) IMPLANT
SPLINT PLASTER CAST XFAST 5X30 (CAST SUPPLIES) IMPLANT
SPLINT PLASTER XFAST SET 5X30 (CAST SUPPLIES) ×1
SPONGE GAUZE 4X4 12PLY (GAUZE/BANDAGES/DRESSINGS) ×2 IMPLANT
STOCKINETTE 6  STRL (DRAPES) ×1
STOCKINETTE 6 STRL (DRAPES) ×1 IMPLANT
SUCTION FRAZIER TIP 10 FR DISP (SUCTIONS) ×2 IMPLANT
SUT ETHILON 4 0 PS 2 18 (SUTURE) ×6 IMPLANT
SUT PDS AB 4-0 PS2 18 (SUTURE) ×4 IMPLANT
SUT VIC AB 2-0 PS2 27 (SUTURE) ×2 IMPLANT
SYR BULB 3OZ (MISCELLANEOUS) ×2 IMPLANT
SYR CONTROL 10ML LL (SYRINGE) IMPLANT
TOWEL OR 17X26 10 PK STRL BLUE (TOWEL DISPOSABLE) ×4 IMPLANT
TOWEL OR NON WOVEN STRL DISP B (DISPOSABLE) ×2 IMPLANT
TRAY PREP A LATEX SAFE STRL (SET/KITS/TRAYS/PACK) ×1 IMPLANT
UNDERPAD 30X30 INCONTINENT (UNDERPADS AND DIAPERS) ×2 IMPLANT
WATER STERILE IRR 1500ML POUR (IV SOLUTION) ×2 IMPLANT

## 2012-07-01 NOTE — Anesthesia Preprocedure Evaluation (Addendum)
Anesthesia Evaluation  Patient identified by MRN, date of birth, ID band Patient awake    Reviewed: Allergy & Precautions, H&P , NPO status , Patient's Chart, lab work & pertinent test results  Airway Mallampati: II TM Distance: >3 FB Neck ROM: Full    Dental  (+) Dental Advisory Given and Teeth Intact   Pulmonary sleep apnea ,  breath sounds clear to auscultation  Pulmonary exam normal       Cardiovascular hypertension, Pt. on medications and Pt. on home beta blockers Rhythm:Regular Rate:Normal     Neuro/Psych PSYCHIATRIC DISORDERS Anxiety Depression    GI/Hepatic negative GI ROS, Neg liver ROS, GERD-  Medicated and Controlled,  Endo/Other  diabetes, Type 2, Oral Hypoglycemic AgentsHypothyroidism Morbid obesity  Renal/GU negative Renal ROS     Musculoskeletal negative musculoskeletal ROS (+)   Abdominal (+) + obese,   Peds  Hematology negative hematology ROS (+)   Anesthesia Other Findings   Reproductive/Obstetrics                         Anesthesia Physical Anesthesia Plan  ASA: III  Anesthesia Plan: General   Post-op Pain Management:    Induction: Intravenous  Airway Management Planned: Oral ETT  Additional Equipment:   Intra-op Plan:   Post-operative Plan: Extubation in OR  Informed Consent: I have reviewed the patients History and Physical, chart, labs and discussed the procedure including the risks, benefits and alternatives for the proposed anesthesia with the patient or authorized representative who has indicated his/her understanding and acceptance.   Dental advisory given  Plan Discussed with: CRNA and Surgeon  Anesthesia Plan Comments:        Anesthesia Quick Evaluation

## 2012-07-01 NOTE — Brief Op Note (Signed)
07/01/2012  5:18 PM  PATIENT:  Grace Delacruz  66 y.o. female  PRE-OPERATIVE DIAGNOSIS:  left hallux vallgus, first metatarcal primus varus, 2nd 3 metatarsalgia, hammer toes, sinus tarsi  POST-OPERATIVE DIAGNOSIS:  left hallux vallgus, first metatarcalprimus varus, 2nd 3  PROCEDURE:  Procedure(s) (LRB) with comments: TARSAL METATARSAL FUSION WITH WEIL OSTEOTOMY (Left) - Left first Tarsal Metarsal fusion osteotomy, Modified McBride Bunionectomy, 2nd 3rd Weil Metatarsal shortening osteotomy,Subtalar arthrotomy, Tenosynovectomy 2nd and 3rd toes MTP, Joint dorsal Capuslotomy, Hammer toe reconstruction, FDL,to Proximal Tendon Transfer BUNIONECTOMY WITH HAMMERTOE RECONSTRUCTION (Left) TENOSYNOVECTOMY (Left)  SURGEON:  Surgeon(s) and Role:    * Sherri Rad, MD - Primary  PHYSICIAN ASSISTANT: Rexene Edison, PAC   ASSISTANTS: Rexene Edison, [facility]    ANESTHESIA:   general  EBL:  Total I/O In: -  Out: 25 [Blood:25]  BLOOD ADMINISTERED:none  DRAINS: none   LOCAL MEDICATIONS USED:  MARCAINE     SPECIMEN:  No Specimen  DISPOSITION OF SPECIMEN:  N/A  COUNTS:  YES  TOURNIQUET:  * Missing tourniquet times found for documented tourniquets in log:  [id number] *  DICTATION: .Other Dictation: Dictation Number [id number]  PLAN OF CARE: Discharge to home after PACU  PATIENT DISPOSITION:  PACU - hemodynamically stable.   Delay start of Pharmacological VTE agent (>24hrs) due to surgical blood loss or risk of bleeding: no

## 2012-07-01 NOTE — Transfer of Care (Signed)
Immediate Anesthesia Transfer of Care Note  Patient: Grace Delacruz  Procedure(s) Performed: Procedure(s) (LRB) with comments: TARSAL METATARSAL FUSION WITH WEIL OSTEOTOMY (Left) - Left first Tarsal Metarsal fusion osteotomy, Modified McBride Bunionectomy, 2nd 3rd Weil Metatarsal shortening osteotomy,Subtalar arthrotomy, Tenosynovectomy 2nd and 3rd toes MTP, Joint dorsal Capuslotomy, Hammer toe reconstruction, FDL,to Proximal Tendon Transfer BUNIONECTOMY WITH HAMMERTOE RECONSTRUCTION (Left) TENOSYNOVECTOMY (Left)  Patient Location: PACU  Anesthesia Type: General  Level of Consciousness: awake, alert  and oriented  Airway & Oxygen Therapy: Patient Spontanous Breathing and Patient connected to face mask oxygen  Post-op Assessment: Report given to PACU RN and Post -op Vital signs reviewed and stable  Post vital signs: Reviewed and stable  Complications: No apparent anesthesia complications

## 2012-07-01 NOTE — H&P (Signed)
  H&P documentation: Placed to be scanned history and physical exam in chart.  -History and Physical Reviewed  -Patient has been re-examined  -No change in the plan of care  Tiki Tucciarone A  

## 2012-07-01 NOTE — Progress Notes (Signed)
Husband instructed to remove scopolamine patch behind right ear on 07/02/12

## 2012-07-01 NOTE — Anesthesia Postprocedure Evaluation (Signed)
Anesthesia Post Note  Patient: Grace Delacruz  Procedure(s) Performed: Procedure(s) (LRB): TARSAL METATARSAL FUSION WITH WEIL OSTEOTOMY (Left) BUNIONECTOMY WITH HAMMERTOE RECONSTRUCTION (Left) TENOSYNOVECTOMY (Left)  Anesthesia type: General  Patient location: PACU  Post pain: Pain level controlled  Post assessment: Post-op Vital signs reviewed  Last Vitals:  Filed Vitals:   07/01/12 1730  BP: 157/68  Pulse: 80  Temp: 37 C  Resp: 12    Post vital signs: Reviewed  Level of consciousness: sedated  Complications: No apparent anesthesia complications

## 2012-07-01 NOTE — Preoperative (Signed)
Beta Blockers   Reason not to administer Beta Blockers:pt took this am

## 2012-07-02 NOTE — Op Note (Signed)
Grace Delacruz, Grace Delacruz               ACCOUNT NO.:  [id number]  MEDICAL RECORD NO.:  [id number]  LOCATION:  [facility]                         FACILITY:  [facility]  PHYSICIAN:  Leonides Grills, M.D.     DATE OF BIRTH:  09/12/1946  DATE OF PROCEDURE:  07/01/2012 DATE OF DISCHARGE:  07/01/2012                              OPERATIVE REPORT   PREOPERATIVE DIAGNOSES: 1. Left hallux valgus. 2. Left metatarsus primus varus. 3. Left second and third metatarsalgia. 4. Left second and third hammertoes. 5. Left sinus tarsi impingement.  POSTOPERATIVE DIAGNOSES: 1. Left hallux valgus. 2. Left metatarsus primus varus. 3. Left second and third metatarsalgia. 4. Left second and third hammertoes. 5. Left sinus tarsi impingement.  OPERATION: 1. Left first TMT joint fusion with osteotomy. 2. Left modified McBride bunionectomy. 3. Local bone graft. 4. Left second and third Weil metatarsal-shortening osteotomies. 5. Left subtalar arthrotomy with tenosynovectomy. 6. Left second and third toes MTP joint dorsal capsulotomy, collateral     release. 7. Left second and third toe hammertoe reconstruction. 8. Left second toe FDL to proximal phalanx tendon transfer.  ANESTHESIA:  General.  SURGEON:  Leonides Grills, MD  ASSISTANT:  Richardean Canal, PA-C  ESTIMATED BLOOD LOSS:  Minimal.  TOURNIQUET TIME:  Approximately 2 hours.  COMPLICATIONS:  None.  DISPOSITION:  Stable to PR.  INDICATION:  This is a 66 year old female who has had longstanding left forefoot pain and anterolateral ankle pain due to the above pathology that was interfering with her life to the point where she cannot do what she wants to do despite conservative management.  She was consented for the above procedure.  All risks, infection, nerve or vessel injury, nonunion, malunion, hardware irritation, hardware failure, persistent pain, worsening pain, prolonged recovery, stiffness, arthritis, wound healing problems, DVT, PE, recurrence of  hallux valgus, development of hallux varus, cock-up toe deformity and the fact that her toes would be stiff and the fact that her fourth and fifth toes may become more of an issue down the road were all explained.  Questions were encouraged and answered.  OPERATION:  The patient was brought to the operating room and placed in the supine position after adequate general anesthesia was administered as well as Ancef 1 g IV piggyback.  The left lower extremity was then prepped and draped in a sterile manner over proximally placed thigh tourniquet.  Limb was gravity exsanguinated and tourniquet was elevated to 290 mmHg.  A curvilinear incision over the left sinus tarsi area was then made.  Dissection was carried down through the skin.  Hemostasis was obtained.  Careful dissection was carried down to the anterior portion of the subtalar joint.  Subtalar arthrotomy was then made. There was a large amount of synovitis in this area and this was meticulously debrided.  We then ranged the subtalar joint.  There was no impingement.  The area was copiously irrigated with normal saline.  We then made a longitudinal incision over the dorsomedial aspect of the left great toe MTP joint.  Dissection was carried down through the skin. Hemostasis was obtained.  Careful dissection was carried down to the lateral capsule.  Lateral capsule was then released with a scalpel  as well as the adductor hallucis.  This allowed the sesamoids to reduced beautifully.  We then made a longitudinal incision midline over the medial aspect of the left great toe MTP joint.  Dissection was carried down through the skin.  Hemostasis was obtained.  Dorsomedial digital nerve was carefully dissected out and retracted out of harm's way throughout the case.  L-shaped capsulotomy was then made.  We then made a longitudinal incision midline between EHL and EHB.  Dissection was carried down through the skin.  Hemostasis was obtained.   Both tendons were protected within their tenosynovial sheaths.  Soft tissue was then elevated off the dorsal aspect base of first metatarsal and medial cuneiform.  We then entered the first TMT joint.  There was a sagittal saw, a closing wedge osteotomy was then made to reduce the metatarsus primus varus and plantar flexed to first ray adequately.  Once this was meticulously removed and we had perfectly two congruent surfaces, we then reduced the TMT joint and provisionally fixed this with a K-wire. We then obtained x-rays to verify that this was in the proper position. The sesamoids were almost completely located with this maneuver.  We then made a notch at the base of the first metatarsal with a bur and then placed a 3.5-mm fully-threaded cortical lag screw using a 3.5 and 2.5-mm drill hole respectively.  This had excellent purchase and maintenance of the desired position.  This was countersunk.  This was a stainless steel [employer] 3.5-mm screw.  We then removed the K-wire and replaced this with a [employer] 3.5-mm fully-threaded cortical stainless screw using a 3.5 and 2.5-mm drill hole respectively.  This had excellent purchase and maintenance of the desired position.  We then obtained stress x-rays in AP and lateral planes that showed no gross motion, fixation, proposition and excellent alignment as well. Metatarsus primus varus was corrected.  Sesamoids were almost completely corrected, but we finished the modified McBride bunionectomy by reconstructing and advancing the medial capsule with 2-0 Vicryl stitch. This had outstanding repair.  We then obtained an x-ray to verify sesamoids were well located and the hallux valgus deformity was corrected.  Range of motion of the great toe MTP joint was excellent. There was no clicking, catching, or locking.  We then made a longitudinal incision on dorsal aspect of the left second toe. Dissection was carried down through the skin.  Hemostasis  was obtained. Careful dissection was carried down to identify the EDB and EDL tendons. The EDL was tenotomized proximal medial and brevis distal lateral and retracted out of harm's way throughout the case for later transfer.  We then performed an MTP joint dorsal capsulotomy and collateral release and this had an excellent release by capsule.  The distal aspect of proximal phalanx was then skeletonized and the head was then removed with a sagittal saw.  The same exact procedure was done for the third toe as well as described for the second through separate incision.  Back to the second toe.  We then plantar flexed at the MTP joint.  Then with a sagittal saw, we then performed a Weil metatarsal-shortening osteotomy at the plane of the plantar aspect of the left foot.  The head was then translated approximately 3 mm proximally and then fixed with a 1.5-mm fully-threaded cortical set screw using a 1.1-mm drill hole respectively.  This was a stainless steel screw from [employer].  This had excellent purchase and maintenance of the desired position, this was countersunk.  The  redundant bone dorsally was trimmed off with a rongeur.  Same exact procedure that was done for the second was done for the third metatarsal as well.  Bone wax was applied to expose the bony surfaces on the dorsal aspect from the second metatarsal and third metatarsal heads respectively.  Local bone graft obtained throughout the procedure.  From this procedure as well as from the drill bits were saved in the back table and this was placed at the first TMT joint as a stress strain relieving local bone, this had an outstanding repair.  We then made a longitudinal incision in the plantar plate of the left second toe PIP joint.  FDL tendon was identified.  This was tenotomized as distal as possible.  Then, we created a drill hole at the base of the proximal phalanx.  We then transferred the FDL through the drill holes as well.   We then reconstructed this with 4-0 PDS stitch.  This had an Conservation officer, historic buildings.  We then placed a K-wire antegrade through middle and distal phalanx, reduced the PIP joint, and then with tension on the FDL tendon through drill holes, fired this across the MTP joint.  This held the toe in the desired position as well.  We then transferred the EDB to EDL tendon dorsally using a 4-0 PDS stitch showing this to the stump of the FDL tendon, recreating the extension, expansion use.  This had an Conservation officer, historic buildings.  We did not do an FDL to proximal phalanx tendon transfer for the third toe, but we did place a K-wire antegrade through middle and distal phalanx, reduced the PIP joint, and fired this retrograde across the MTP joint.  Again, this held the toe in desired position.  We then transferred the EDB to EDL tendon using 4-0 PDS stitch.  This had an Conservation officer, historic buildings.  Tourniquet was deflated. Hemostasis was obtained.  There was no pulsatile bleeding.  Subcu was closed with 4-0 PDS.  Skin was closed with 4-0 nylon over all wounds. Sterile dressing was applied.  Roger-Mann dressing was applied. Modified Jones dressing was applied with the ankle in neutral dorsiflexion.  The patient was stable to PR.     Leonides Grills, M.D.     PB/MEDQ  D:  07/01/2012  T:  07/02/2012  Job:  [id number]

## 2012-07-06 DIAGNOSIS — Z5189 Encounter for other specified aftercare: Secondary | ICD-10-CM | POA: Diagnosis not present

## 2012-08-03 DIAGNOSIS — Z5189 Encounter for other specified aftercare: Secondary | ICD-10-CM | POA: Diagnosis not present

## 2012-08-04 DIAGNOSIS — Z23 Encounter for immunization: Secondary | ICD-10-CM | POA: Diagnosis not present

## 2012-08-11 DIAGNOSIS — Z5189 Encounter for other specified aftercare: Secondary | ICD-10-CM | POA: Diagnosis not present

## 2012-08-11 DIAGNOSIS — M25519 Pain in unspecified shoulder: Secondary | ICD-10-CM | POA: Diagnosis not present

## 2012-08-24 DIAGNOSIS — Z5189 Encounter for other specified aftercare: Secondary | ICD-10-CM | POA: Diagnosis not present

## 2012-08-26 DIAGNOSIS — M25579 Pain in unspecified ankle and joints of unspecified foot: Secondary | ICD-10-CM | POA: Diagnosis not present

## 2012-08-26 DIAGNOSIS — M25473 Effusion, unspecified ankle: Secondary | ICD-10-CM | POA: Diagnosis not present

## 2012-08-28 DIAGNOSIS — M25579 Pain in unspecified ankle and joints of unspecified foot: Secondary | ICD-10-CM | POA: Diagnosis not present

## 2012-08-28 DIAGNOSIS — M25473 Effusion, unspecified ankle: Secondary | ICD-10-CM | POA: Diagnosis not present

## 2012-09-02 DIAGNOSIS — M25579 Pain in unspecified ankle and joints of unspecified foot: Secondary | ICD-10-CM | POA: Diagnosis not present

## 2012-09-02 DIAGNOSIS — M25473 Effusion, unspecified ankle: Secondary | ICD-10-CM | POA: Diagnosis not present

## 2012-09-02 DIAGNOSIS — M25476 Effusion, unspecified foot: Secondary | ICD-10-CM | POA: Diagnosis not present

## 2012-09-03 DIAGNOSIS — M25579 Pain in unspecified ankle and joints of unspecified foot: Secondary | ICD-10-CM | POA: Diagnosis not present

## 2012-09-03 DIAGNOSIS — M25476 Effusion, unspecified foot: Secondary | ICD-10-CM | POA: Diagnosis not present

## 2012-09-03 DIAGNOSIS — M25473 Effusion, unspecified ankle: Secondary | ICD-10-CM | POA: Diagnosis not present

## 2012-09-08 DIAGNOSIS — M25579 Pain in unspecified ankle and joints of unspecified foot: Secondary | ICD-10-CM | POA: Diagnosis not present

## 2012-09-08 DIAGNOSIS — M25476 Effusion, unspecified foot: Secondary | ICD-10-CM | POA: Diagnosis not present

## 2012-09-09 DIAGNOSIS — M25473 Effusion, unspecified ankle: Secondary | ICD-10-CM | POA: Diagnosis not present

## 2012-09-09 DIAGNOSIS — M25476 Effusion, unspecified foot: Secondary | ICD-10-CM | POA: Diagnosis not present

## 2012-09-09 DIAGNOSIS — M25579 Pain in unspecified ankle and joints of unspecified foot: Secondary | ICD-10-CM | POA: Diagnosis not present

## 2012-09-11 DIAGNOSIS — M25476 Effusion, unspecified foot: Secondary | ICD-10-CM | POA: Diagnosis not present

## 2012-09-11 DIAGNOSIS — M25579 Pain in unspecified ankle and joints of unspecified foot: Secondary | ICD-10-CM | POA: Diagnosis not present

## 2012-09-14 DIAGNOSIS — M25579 Pain in unspecified ankle and joints of unspecified foot: Secondary | ICD-10-CM | POA: Diagnosis not present

## 2012-09-14 DIAGNOSIS — M25473 Effusion, unspecified ankle: Secondary | ICD-10-CM | POA: Diagnosis not present

## 2012-09-15 DIAGNOSIS — M25473 Effusion, unspecified ankle: Secondary | ICD-10-CM | POA: Diagnosis not present

## 2012-09-15 DIAGNOSIS — M25579 Pain in unspecified ankle and joints of unspecified foot: Secondary | ICD-10-CM | POA: Diagnosis not present

## 2012-09-21 DIAGNOSIS — Z5189 Encounter for other specified aftercare: Secondary | ICD-10-CM | POA: Diagnosis not present

## 2012-09-22 DIAGNOSIS — M25579 Pain in unspecified ankle and joints of unspecified foot: Secondary | ICD-10-CM | POA: Diagnosis not present

## 2012-09-22 DIAGNOSIS — M25476 Effusion, unspecified foot: Secondary | ICD-10-CM | POA: Diagnosis not present

## 2012-09-22 DIAGNOSIS — M25473 Effusion, unspecified ankle: Secondary | ICD-10-CM | POA: Diagnosis not present

## 2012-09-25 DIAGNOSIS — M25473 Effusion, unspecified ankle: Secondary | ICD-10-CM | POA: Diagnosis not present

## 2012-09-25 DIAGNOSIS — M25579 Pain in unspecified ankle and joints of unspecified foot: Secondary | ICD-10-CM | POA: Diagnosis not present

## 2012-09-25 DIAGNOSIS — M25476 Effusion, unspecified foot: Secondary | ICD-10-CM | POA: Diagnosis not present

## 2012-09-29 DIAGNOSIS — M25473 Effusion, unspecified ankle: Secondary | ICD-10-CM | POA: Diagnosis not present

## 2012-09-29 DIAGNOSIS — M25579 Pain in unspecified ankle and joints of unspecified foot: Secondary | ICD-10-CM | POA: Diagnosis not present

## 2012-09-29 DIAGNOSIS — M25476 Effusion, unspecified foot: Secondary | ICD-10-CM | POA: Diagnosis not present

## 2012-09-30 DIAGNOSIS — M25579 Pain in unspecified ankle and joints of unspecified foot: Secondary | ICD-10-CM | POA: Diagnosis not present

## 2012-09-30 DIAGNOSIS — M25473 Effusion, unspecified ankle: Secondary | ICD-10-CM | POA: Diagnosis not present

## 2012-10-01 DIAGNOSIS — M25579 Pain in unspecified ankle and joints of unspecified foot: Secondary | ICD-10-CM | POA: Diagnosis not present

## 2012-10-01 DIAGNOSIS — I10 Essential (primary) hypertension: Secondary | ICD-10-CM | POA: Diagnosis not present

## 2012-10-01 DIAGNOSIS — E119 Type 2 diabetes mellitus without complications: Secondary | ICD-10-CM | POA: Diagnosis not present

## 2012-10-01 DIAGNOSIS — E039 Hypothyroidism, unspecified: Secondary | ICD-10-CM | POA: Diagnosis not present

## 2012-10-01 DIAGNOSIS — M25476 Effusion, unspecified foot: Secondary | ICD-10-CM | POA: Diagnosis not present

## 2012-10-01 DIAGNOSIS — E7800 Pure hypercholesterolemia, unspecified: Secondary | ICD-10-CM | POA: Diagnosis not present

## 2012-10-07 DIAGNOSIS — E7800 Pure hypercholesterolemia, unspecified: Secondary | ICD-10-CM | POA: Diagnosis not present

## 2012-10-07 DIAGNOSIS — F329 Major depressive disorder, single episode, unspecified: Secondary | ICD-10-CM | POA: Diagnosis not present

## 2012-10-07 DIAGNOSIS — E119 Type 2 diabetes mellitus without complications: Secondary | ICD-10-CM | POA: Diagnosis not present

## 2012-10-07 DIAGNOSIS — N959 Unspecified menopausal and perimenopausal disorder: Secondary | ICD-10-CM | POA: Diagnosis not present

## 2012-10-07 DIAGNOSIS — E039 Hypothyroidism, unspecified: Secondary | ICD-10-CM | POA: Diagnosis not present

## 2012-10-07 DIAGNOSIS — R609 Edema, unspecified: Secondary | ICD-10-CM | POA: Diagnosis not present

## 2012-10-07 DIAGNOSIS — I10 Essential (primary) hypertension: Secondary | ICD-10-CM | POA: Diagnosis not present

## 2012-10-07 DIAGNOSIS — Z0000 Encounter for general adult medical examination without abnormal findings: Secondary | ICD-10-CM | POA: Diagnosis not present

## 2012-10-08 DIAGNOSIS — M25579 Pain in unspecified ankle and joints of unspecified foot: Secondary | ICD-10-CM | POA: Diagnosis not present

## 2012-10-08 DIAGNOSIS — M25473 Effusion, unspecified ankle: Secondary | ICD-10-CM | POA: Diagnosis not present

## 2012-10-08 DIAGNOSIS — M25476 Effusion, unspecified foot: Secondary | ICD-10-CM | POA: Diagnosis not present

## 2012-10-15 DIAGNOSIS — M25579 Pain in unspecified ankle and joints of unspecified foot: Secondary | ICD-10-CM | POA: Diagnosis not present

## 2012-10-15 DIAGNOSIS — M25476 Effusion, unspecified foot: Secondary | ICD-10-CM | POA: Diagnosis not present

## 2012-10-20 DIAGNOSIS — M25579 Pain in unspecified ankle and joints of unspecified foot: Secondary | ICD-10-CM | POA: Diagnosis not present

## 2012-10-22 DIAGNOSIS — M25476 Effusion, unspecified foot: Secondary | ICD-10-CM | POA: Diagnosis not present

## 2012-10-22 DIAGNOSIS — M25579 Pain in unspecified ankle and joints of unspecified foot: Secondary | ICD-10-CM | POA: Diagnosis not present

## 2012-12-17 DIAGNOSIS — M25579 Pain in unspecified ankle and joints of unspecified foot: Secondary | ICD-10-CM | POA: Diagnosis not present

## 2012-12-17 DIAGNOSIS — Z4789 Encounter for other orthopedic aftercare: Secondary | ICD-10-CM | POA: Diagnosis not present

## 2013-01-23 DIAGNOSIS — Z880 Allergy status to penicillin: Secondary | ICD-10-CM | POA: Diagnosis not present

## 2013-01-23 DIAGNOSIS — Z882 Allergy status to sulfonamides status: Secondary | ICD-10-CM | POA: Diagnosis not present

## 2013-01-23 DIAGNOSIS — Z23 Encounter for immunization: Secondary | ICD-10-CM | POA: Diagnosis not present

## 2013-01-23 DIAGNOSIS — Z79899 Other long term (current) drug therapy: Secondary | ICD-10-CM | POA: Diagnosis not present

## 2013-01-23 DIAGNOSIS — S0120XA Unspecified open wound of nose, initial encounter: Secondary | ICD-10-CM | POA: Diagnosis not present

## 2013-01-26 DIAGNOSIS — N63 Unspecified lump in unspecified breast: Secondary | ICD-10-CM | POA: Diagnosis not present

## 2013-01-26 DIAGNOSIS — D485 Neoplasm of uncertain behavior of skin: Secondary | ICD-10-CM | POA: Diagnosis not present

## 2013-04-06 DIAGNOSIS — E119 Type 2 diabetes mellitus without complications: Secondary | ICD-10-CM | POA: Diagnosis not present

## 2013-04-06 DIAGNOSIS — I10 Essential (primary) hypertension: Secondary | ICD-10-CM | POA: Diagnosis not present

## 2013-04-06 DIAGNOSIS — E039 Hypothyroidism, unspecified: Secondary | ICD-10-CM | POA: Diagnosis not present

## 2013-04-06 DIAGNOSIS — M129 Arthropathy, unspecified: Secondary | ICD-10-CM | POA: Diagnosis not present

## 2013-07-23 DIAGNOSIS — M25559 Pain in unspecified hip: Secondary | ICD-10-CM | POA: Diagnosis not present

## 2013-07-28 DIAGNOSIS — M4150 Other secondary scoliosis, site unspecified: Secondary | ICD-10-CM | POA: Diagnosis not present

## 2013-07-28 DIAGNOSIS — M47812 Spondylosis without myelopathy or radiculopathy, cervical region: Secondary | ICD-10-CM | POA: Diagnosis not present

## 2013-07-28 DIAGNOSIS — M5030 Other cervical disc degeneration, unspecified cervical region: Secondary | ICD-10-CM | POA: Diagnosis not present

## 2013-07-28 DIAGNOSIS — M4120 Other idiopathic scoliosis, site unspecified: Secondary | ICD-10-CM | POA: Diagnosis not present

## 2013-07-28 DIAGNOSIS — M47817 Spondylosis without myelopathy or radiculopathy, lumbosacral region: Secondary | ICD-10-CM | POA: Diagnosis not present

## 2013-07-28 DIAGNOSIS — M419 Scoliosis, unspecified: Secondary | ICD-10-CM | POA: Insufficient documentation

## 2013-07-29 DIAGNOSIS — I10 Essential (primary) hypertension: Secondary | ICD-10-CM | POA: Diagnosis not present

## 2013-07-30 DIAGNOSIS — Z23 Encounter for immunization: Secondary | ICD-10-CM | POA: Diagnosis not present

## 2013-08-23 DIAGNOSIS — M19049 Primary osteoarthritis, unspecified hand: Secondary | ICD-10-CM | POA: Diagnosis not present

## 2013-08-24 ENCOUNTER — Ambulatory Visit (INDEPENDENT_AMBULATORY_CARE_PROVIDER_SITE_OTHER): Payer: Medicare Other | Admitting: Cardiology

## 2013-08-24 ENCOUNTER — Encounter: Payer: Self-pay | Admitting: Cardiology

## 2013-08-24 VITALS — BP 148/87 | HR 66 | Ht 65.0 in | Wt 252.0 lb

## 2013-08-24 DIAGNOSIS — I447 Left bundle-branch block, unspecified: Secondary | ICD-10-CM | POA: Diagnosis not present

## 2013-08-24 DIAGNOSIS — E119 Type 2 diabetes mellitus without complications: Secondary | ICD-10-CM | POA: Diagnosis not present

## 2013-08-24 DIAGNOSIS — I10 Essential (primary) hypertension: Secondary | ICD-10-CM

## 2013-08-24 DIAGNOSIS — G4733 Obstructive sleep apnea (adult) (pediatric): Secondary | ICD-10-CM

## 2013-08-24 MED ORDER — CLONIDINE HCL 0.1 MG PO TABS: 0.2000 mg | ORAL_TABLET | Freq: Two times a day (BID) | ORAL | Status: DC

## 2013-08-24 MED ORDER — LOSARTAN POTASSIUM 100 MG PO TABS: 100.0000 mg | ORAL_TABLET | Freq: Every evening | ORAL | Status: AC

## 2013-08-24 NOTE — Progress Notes (Signed)
Clinical Summary Grace Delacruz is a 67 y.o.female last seen in the office in August 2013. She is referred back to the office by Dr. Dimas Aguas with concerns about difficult to control hypertension. Medical therapy has escalated, most recent addition was clonidine. She tells me that during an outpatient evaluation at [facility] for scoliosis, she was noted to have elevated blood pressure of "220/110." Blood pressure in this range was replicated at a subsequent visit with Dr. Dimas Aguas as well. She states that her blood pressure is starting to come down with medicine adjustments, although she still has systolics in the 170-180 range in the morning sometimes. Occasionally feels lightheaded, no palpitations or chest pain. No syncope. She was not found to be orthostatic in the office today.  Echocardiogram done in August of last year indicated moderate LVH with LVEF of 55-65%, grade 1 diastolic dysfunction, paradoxical septal motion, mild left atrial enlargement. No major abnormalities. Lexiscan Myoview done in August of last year reports no significant ST segment changes and overall normal perfusion, no clear evidence of ischemia.   She does report a history of sleep apnea, apparently mild in the past. She tells me that she was not able to tolerate CPAP years ago. She has not had followup assessment for this.  ECG today shows normal sinus rhythm at 61 beats per minute.   Allergies  Allergen Reactions  . Hibiclens [Chlorhexidine Gluconate]     SKIN REDNESS AND BURNING SENSATION  . Keflex [Cephalexin]     RASH, ITCHING    Current Outpatient Prescriptions  Medication Sig Dispense Refill  . aspirin 81 MG tablet Take 81 mg by mouth daily.      . cloNIDine (CATAPRES) 0.1 MG tablet Take 2 tablets (0.2 mg total) by mouth 2 (two) times daily.      Marland Kitchen estrogen, conjugated,-medroxyprogesterone (PREMPRO) 0.625-2.5 MG per tablet Take 1 tablet by mouth daily. Patient does not take this dose      . FLUoxetine (PROZAC) 20  MG capsule Take 20 mg by mouth every morning.       . furosemide (LASIX) 40 MG tablet Take 40 mg by mouth daily.      Marland Kitchen ibuprofen (ADVIL,MOTRIN) 200 MG tablet Take 200 mg by mouth at bedtime.      Marland Kitchen levothyroxine (SYNTHROID, LEVOTHROID) 112 MCG tablet Take 112 mcg by mouth once. IN AM      . losartan (COZAAR) 100 MG tablet Take 1 tablet (100 mg total) by mouth every evening.      . meclizine (ANTIVERT) 25 MG tablet Take 25 mg by mouth 3 (three) times daily as needed.      . metFORMIN (GLUCOPHAGE) 500 MG tablet Take 500 mg by mouth daily with breakfast.       . metoprolol succinate (TOPROL-XL) 100 MG 24 hr tablet Take 100 mg by mouth every morning. Take with or immediately following a meal.      . simvastatin (ZOCOR) 40 MG tablet Take 40 mg by mouth at bedtime.       No current facility-administered medications for this visit.    Past Medical History  Diagnosis Date  . Osteoarthritis   . Essential hypertension, benign   . Type 2 diabetes mellitus   . Hypothyroidism   . Depression   . Anxiety   . Fibrocystic breast disease   . Scoliosis   . DJD (degenerative joint disease)   . Menopause   . Esophageal reflux   . Sleep apnea  Reportedly mild - no CPAP    Past Surgical History  Procedure Laterality Date  . Knee arthroplasty  2001    Right  . Knee arthroplasty  2002    Left  . Cesarean section    . Cholecystectomy    . Left leg vein resection    . Joint fusion-foot      x2 LEFT FOOT  . Rotator cuff repair      LEFT  . Breast biopsy    . Thyroidectomy    . Tonsillectomy      Family History  Problem Relation Age of Onset  . Hypertension Mother   . Heart failure Father     Died with pneumonia  . Hypertension Father   . Arthritis Father   . Diabetes Father     Social History Grace Delacruz reports that she has never smoked. She does not have any smokeless tobacco history on file. Grace Delacruz reports that she drinks alcohol.  Review of Systems No chest pain or  progressive breathlessness. No speech deficits or focal motor weakness. No progressive headaches or visual changes. Limited by arthritis, bilateral knee replacements. Her regular exercise regimen. Stable appetite. Otherwise negative.  Physical Examination Filed Vitals:   08/24/13 0845  BP: 148/87  Pulse: 66   Filed Weights   08/24/13 0829  Weight: 252 lb (114.306 kg)   Overweight woman in no acute distress.  HEENT: Conjunctiva and lids normal, oropharynx clear.  Neck: Supple, no elevated JVP or carotid bruits, no thyromegaly.  Lungs: Clear to auscultation, nonlabored breathing at rest.  Cardiac: Regular rate and rhythm, no S3 or significant systolic murmur, no pericardial rub.  Abdomen: Soft, nontender, bowel sounds present, no guarding or rebound.  Extremities: No pitting edema, distal pulses 2+. Skin: Warm and dry.  Musculoskeletal: No kyphosis.  Neuropsychiatric: Alert and oriented x3, affect grossly appropriate.   Problem List and Plan   Essential hypertension, benign Blood pressure has increased, more difficult to control requiring titration of medications. Clonidine was just advanced to 0.2 mg twice daily by Dr. Dimas Aguas. I asked her to take Toprol-XL in the morning and Cozaar in the evening for perhaps better overlap of long-acting medications. I think it would also be reasonable to be reevaluated for sleep apnea to see if this has progressed. She will be referred to our pulmonary division for diagnostic testing and to assess treatment options. Otherwise we discussed lifestyle modification including sodium restriction, basic exercise regimen. Followup in the next 4-6 weeks.  Type 2 diabetes mellitus Followed by Dr. Dimas Aguas. She continues on Glucophage.  Left bundle branch block Transiently noted last year, possibly rate related. Previous cardiac structural and ischemic workup in August 2013 was reassuring. ECG today is normal.    Jonelle Sidle, M.D., F.A.C.C.

## 2013-08-24 NOTE — Patient Instructions (Signed)
You have been referred to Pulmonology for evaluation of sleep apnea Change Cozaar to take in the evening  Continue all other medications.   Follow up in  4-6 weeks

## 2013-08-24 NOTE — Assessment & Plan Note (Signed)
Transiently noted last year, possibly rate related. Previous cardiac structural and ischemic workup in August 2013 was reassuring. ECG today is normal.

## 2013-08-24 NOTE — Assessment & Plan Note (Signed)
Followed by Dr. Dimas Aguas. She continues on Glucophage.

## 2013-08-24 NOTE — Assessment & Plan Note (Signed)
Blood pressure has increased, more difficult to control requiring titration of medications. Clonidine was just advanced to 0.2 mg twice daily by Dr. Dimas Aguas. I asked her to take Toprol-XL in the morning and Cozaar in the evening for perhaps better overlap of long-acting medications. I think it would also be reasonable to be reevaluated for sleep apnea to see if this has progressed. She will be referred to our pulmonary division for diagnostic testing and to assess treatment options. Otherwise we discussed lifestyle modification including sodium restriction, basic exercise regimen. Followup in the next 4-6 weeks.

## 2013-09-20 DIAGNOSIS — E119 Type 2 diabetes mellitus without complications: Secondary | ICD-10-CM | POA: Diagnosis not present

## 2013-09-20 DIAGNOSIS — I10 Essential (primary) hypertension: Secondary | ICD-10-CM | POA: Diagnosis not present

## 2013-09-20 DIAGNOSIS — E7800 Pure hypercholesterolemia, unspecified: Secondary | ICD-10-CM | POA: Diagnosis not present

## 2013-09-27 DIAGNOSIS — Z1331 Encounter for screening for depression: Secondary | ICD-10-CM | POA: Diagnosis not present

## 2013-09-27 DIAGNOSIS — Z0000 Encounter for general adult medical examination without abnormal findings: Secondary | ICD-10-CM | POA: Diagnosis not present

## 2013-09-27 DIAGNOSIS — M129 Arthropathy, unspecified: Secondary | ICD-10-CM | POA: Diagnosis not present

## 2013-09-27 DIAGNOSIS — I10 Essential (primary) hypertension: Secondary | ICD-10-CM | POA: Diagnosis not present

## 2013-09-27 DIAGNOSIS — E119 Type 2 diabetes mellitus without complications: Secondary | ICD-10-CM | POA: Diagnosis not present

## 2013-09-28 ENCOUNTER — Encounter: Payer: Self-pay | Admitting: Internal Medicine

## 2013-10-04 ENCOUNTER — Encounter (INDEPENDENT_AMBULATORY_CARE_PROVIDER_SITE_OTHER): Payer: Self-pay

## 2013-10-04 ENCOUNTER — Encounter: Payer: Self-pay | Admitting: Pulmonary Disease

## 2013-10-04 ENCOUNTER — Ambulatory Visit (INDEPENDENT_AMBULATORY_CARE_PROVIDER_SITE_OTHER): Payer: Medicare Other | Admitting: Pulmonary Disease

## 2013-10-04 VITALS — BP 132/70 | HR 72 | Temp 97.9°F | Ht 65.5 in | Wt 259.8 lb

## 2013-10-04 DIAGNOSIS — R0683 Snoring: Secondary | ICD-10-CM | POA: Insufficient documentation

## 2013-10-04 DIAGNOSIS — G4733 Obstructive sleep apnea (adult) (pediatric): Secondary | ICD-10-CM | POA: Diagnosis not present

## 2013-10-04 NOTE — Progress Notes (Signed)
Subjective:    Patient ID: Grace Delacruz, female    DOB: Sep 18, 1946, 67 y.o.   MRN: [id number]  HPI The patient is a 67 year old female who I've been asked to see for possible obstructive sleep apnea. She tells me that she had significant hypertension, and it has been difficult to control. She was evaluated for sleep apnea approximately 15 years ago, and a sleep study showed mild sleep apnea. She had difficulties adjusting to the CPAP and therefore discontinued. She currently has been noted to have loud snoring, as well as choking arousals. She has frequent awakenings at night, and is unrested at least 50% of the mornings. She has definite inappropriate daytime sleepiness with inactivity during the day, especially if she gets overly warm. She will also get sleep pressure in the evenings watching television if it is not interested. She has rare sleepiness with driving. Patient tells me that her weight has been neutral over the last few years, and her Epworth score today is only 8.   Sleep Questionnaire What time do you typically go to bed?( Between what hours) 10-12 10-12 at 1458 on 10/04/13 by Maisie Fus, CMA How long does it take you to fall asleep?  at 1458 on 10/04/13 by Maisie Fus, CMA How many times during the night do you wake up? 3 3 at 1458 on 10/04/13 by Maisie Fus, CMA What time do you get out of bed to start your day? No Value 8-9am at 1458 on 10/04/13 by Maisie Fus, CMA Do you drive or operate heavy machinery in your occupation? No No at 1458 on 10/04/13 by Maisie Fus, CMA How much has your weight changed (up or down) over the past two years? (In pounds) 0 oz (0 kg) 0 oz (0 kg) at 1458 on 10/04/13 by Maisie Fus, CMA Have you ever had a sleep study before? Yes Yes at 1458 on 10/04/13 by Maisie Fus, CMA If yes, location of study? [facility] at 1458 on 10/04/13 by Maisie Fus, CMA If yes, date of study? x 97yrs ago x 8yrs ago at  1458 on 10/04/13 by Maisie Fus, CMA Do you currently use CPAP? NoNo h/o CPAP use at 1458 on 10/04/13 by Maisie Fus, CMA Do you wear oxygen at any time? No No at 1458 on 10/04/13 by Maisie Fus, CMA   Review of Systems  Constitutional: Negative for fever and unexpected weight change.  HENT: Negative for congestion, dental problem, ear pain, nosebleeds, postnasal drip, rhinorrhea, sinus pressure, sneezing, sore throat and trouble swallowing.   Eyes: Negative for redness and itching.  Respiratory: Negative for cough, chest tightness, shortness of breath and wheezing.   Cardiovascular: Negative for palpitations and leg swelling.  Gastrointestinal: Negative for nausea and vomiting.       Acid heartburn  Genitourinary: Negative for dysuria.  Musculoskeletal: Negative for joint swelling.  Skin: Negative for rash.  Neurological: Negative for headaches.  Hematological: Does not bruise/bleed easily.  Psychiatric/Behavioral: Negative for dysphoric mood. The patient is not nervous/anxious.        Objective:   Physical Exam Constitutional:  Obese female, no acute distress  HENT:  Nares patent without discharge, but narrowed bilat.   Oropharynx without exudate, palate and uvula are thick and elongated, small OP  Eyes:  Perrla, eomi, no scleral icterus  Neck:  No JVD, no TMG  Cardiovascular:  Normal rate, regular rhythm, no rubs or gallops.  No murmurs  Intact distal pulses  Pulmonary :  Normal breath sounds, no stridor or respiratory distress   No rales, rhonchi, or wheezing  Abdominal:  Soft, nondistended, bowel sounds present.  No tenderness noted.   Musculoskeletal:  1+ lower extremity edema noted.  Lymph Nodes:  No cervical lymphadenopathy noted  Skin:  No cyanosis noted  Neurologic:  Alert, appropriate, moves all 4 extremities without obvious deficit.         Assessment & Plan:

## 2013-10-04 NOTE — Patient Instructions (Signed)
Will schedule for sleep study, and arrange followup once the results are available.   

## 2013-10-04 NOTE — Assessment & Plan Note (Signed)
The patient has a history of questionable mild sleep apnea, and now is having difficulties with hypertension control.  Her history is definitely suggestive of clinically significant sleep apnea, and we'll therefore schedule her for a repeat sleep study. I have had a long discussion with her about sleep apnea, including its impact to her quality of life and cardiovascular health. I have encouraged her to work aggressively on weight loss.

## 2013-10-11 DIAGNOSIS — J329 Chronic sinusitis, unspecified: Secondary | ICD-10-CM | POA: Diagnosis not present

## 2013-10-26 ENCOUNTER — Encounter: Payer: Self-pay | Admitting: Cardiology

## 2013-10-26 ENCOUNTER — Encounter (INDEPENDENT_AMBULATORY_CARE_PROVIDER_SITE_OTHER): Payer: Self-pay

## 2013-10-26 ENCOUNTER — Ambulatory Visit (INDEPENDENT_AMBULATORY_CARE_PROVIDER_SITE_OTHER): Payer: Medicare Other | Admitting: Cardiology

## 2013-10-26 VITALS — BP 114/75 | HR 67 | Ht 70.0 in | Wt 253.0 lb

## 2013-10-26 DIAGNOSIS — I10 Essential (primary) hypertension: Secondary | ICD-10-CM | POA: Diagnosis not present

## 2013-10-26 NOTE — Patient Instructions (Signed)
Your physician recommends that you schedule a follow-up appointment in: 1 month. Your physician recommends that you continue on your current medications as directed. Please refer to the Current Medication list given to you today. 

## 2013-10-26 NOTE — Assessment & Plan Note (Signed)
For now will keep her medical regimen stable, although there are certainly other options to consider that might address her feeling of fatigue. Would not further advance beta blocker, might consider trying to wean her off clonidine. She will be having a sleep study, and if significant OSA is found and treated, this will likely also positively impact her blood pressure control. She just made a New Year's resolution to start working on diet and exercise to lose weight, this would also be beneficial. I will plan to see her back next month after she has gone through the above mentioned studies, and we can determine if any further medication adjustments are needed.

## 2013-10-26 NOTE — Progress Notes (Signed)
Clinical Summary Ms. Kendle is a 68 y.o.female last seen in November 2014. She has had interval assessment by Dr. Gwenette Greet for sleep apnea evaluation. Medications are reviewed below. Her blood pressure looks very good today. She tells her that the trend has also been better, but she is much more fatigued. Norvasc is new, Catapres is twice a day.  She does have a sleep study coming up, then will be having GI workup with colonoscopy.  Echocardiogram done in August 2013 indicated moderate LVH with LVEF of 35-00%, grade 1 diastolic dysfunction, paradoxical septal motion, mild left atrial enlargement. No major abnormalities. Lexiscan Myoview done in 2013 reports no significant ST segment changes and overall normal perfusion, no clear evidence of ischemia.    Allergies  Allergen Reactions  . Hibiclens [Chlorhexidine Gluconate]     SKIN REDNESS AND BURNING SENSATION  . Keflex [Cephalexin]     RASH, ITCHING    Current Outpatient Prescriptions  Medication Sig Dispense Refill  . amLODipine (NORVASC) 10 MG tablet Take 10 mg by mouth daily.      Marland Kitchen aspirin 81 MG tablet Take 81 mg by mouth daily.      . cloNIDine (CATAPRES) 0.1 MG tablet Take 2 tablets (0.2 mg total) by mouth 2 (two) times daily.      Marland Kitchen estrogen, conjugated,-medroxyprogesterone (PREMPRO) 0.625-2.5 MG per tablet Take 1 tablet by mouth daily. Patient does not take this dose      . FLUoxetine (PROZAC) 20 MG capsule Take 20 mg by mouth every morning.       . furosemide (LASIX) 40 MG tablet Take 40 mg by mouth daily.      Marland Kitchen ibuprofen (ADVIL,MOTRIN) 200 MG tablet Take 200 mg by mouth at bedtime.      Marland Kitchen levothyroxine (SYNTHROID, LEVOTHROID) 112 MCG tablet Take 112 mcg by mouth once. IN AM      . losartan (COZAAR) 100 MG tablet Take 1 tablet (100 mg total) by mouth every evening.      . meclizine (ANTIVERT) 25 MG tablet Take 25 mg by mouth 3 (three) times daily as needed.      . metFORMIN (GLUCOPHAGE) 500 MG tablet Take 500 mg by mouth  daily with breakfast.       . metoprolol succinate (TOPROL-XL) 100 MG 24 hr tablet Take 100 mg by mouth every morning. Take with or immediately following a meal.      . simvastatin (ZOCOR) 40 MG tablet Take 40 mg by mouth at bedtime.       No current facility-administered medications for this visit.    Past Medical History  Diagnosis Date  . Osteoarthritis   . Essential hypertension, benign   . Type 2 diabetes mellitus   . Hypothyroidism   . Depression   . Anxiety   . Fibrocystic breast disease   . Scoliosis   . DJD (degenerative joint disease)   . Menopause   . Esophageal reflux   . Sleep apnea     Reportedly mild - no CPAP    Social History Ms. Fetherolf reports that she has never smoked. She does not have any smokeless tobacco history on file. Ms. Plitt reports that she drinks alcohol.  Review of Systems No palpitations or syncope. His leg edema she does not take her Lasix. No orthopnea or PND. Otherwise negative.  Physical Examination Filed Vitals:   10/26/13 1035  BP: 114/75  Pulse: 67   Filed Weights   10/26/13 1035  Weight: 253 lb (114.76  kg)    Overweight woman in no acute distress.  HEENT: Conjunctiva and lids normal, oropharynx clear.  Neck: Supple, no elevated JVP or carotid bruits, no thyromegaly.  Lungs: Clear to auscultation, nonlabored breathing at rest.  Cardiac: Regular rate and rhythm, no S3 or significant systolic murmur, no pericardial rub.  Abdomen: Soft, nontender, bowel sounds present, no guarding or rebound.  Extremities: No pitting edema, distal pulses 2+.    Problem List and Plan   Essential hypertension, benign For now will keep her medical regimen stable, although there are certainly other options to consider that might address her feeling of fatigue. Would not further advance beta blocker, might consider trying to wean her off clonidine. She will be having a sleep study, and if significant OSA is found and treated, this will likely  also positively impact her blood pressure control. She just made a New Year's resolution to start working on diet and exercise to lose weight, this would also be beneficial. I will plan to see her back next month after she has gone through the above mentioned studies, and we can determine if any further medication adjustments are needed.    Satira Sark, M.D., F.A.C.C.

## 2013-11-01 ENCOUNTER — Encounter (INDEPENDENT_AMBULATORY_CARE_PROVIDER_SITE_OTHER): Payer: Self-pay

## 2013-11-01 ENCOUNTER — Ambulatory Visit: Payer: Medicare Other | Admitting: Gastroenterology

## 2013-11-01 ENCOUNTER — Other Ambulatory Visit (INDEPENDENT_AMBULATORY_CARE_PROVIDER_SITE_OTHER): Payer: Self-pay | Admitting: *Deleted

## 2013-11-01 DIAGNOSIS — K5190 Ulcerative colitis, unspecified, without complications: Secondary | ICD-10-CM

## 2013-11-01 DIAGNOSIS — Z86010 Personal history of colonic polyps: Secondary | ICD-10-CM

## 2013-11-02 ENCOUNTER — Telehealth (INDEPENDENT_AMBULATORY_CARE_PROVIDER_SITE_OTHER): Payer: Self-pay | Admitting: *Deleted

## 2013-11-02 ENCOUNTER — Encounter (INDEPENDENT_AMBULATORY_CARE_PROVIDER_SITE_OTHER): Payer: Self-pay | Admitting: *Deleted

## 2013-11-02 DIAGNOSIS — Z1211 Encounter for screening for malignant neoplasm of colon: Secondary | ICD-10-CM

## 2013-11-02 MED ORDER — PEG 3350-KCL-NA BICARB-NACL 420 G PO SOLR: 4000.0000 mL | Freq: Once | ORAL | Status: DC

## 2013-11-02 NOTE — Telephone Encounter (Signed)
Patient needs trilyte 

## 2013-11-05 ENCOUNTER — Ambulatory Visit (HOSPITAL_BASED_OUTPATIENT_CLINIC_OR_DEPARTMENT_OTHER): Payer: Medicare Other | Attending: Pulmonary Disease

## 2013-11-05 VITALS — Ht 65.0 in | Wt 253.0 lb

## 2013-11-05 DIAGNOSIS — G4761 Periodic limb movement disorder: Secondary | ICD-10-CM | POA: Diagnosis not present

## 2013-11-05 DIAGNOSIS — G4733 Obstructive sleep apnea (adult) (pediatric): Secondary | ICD-10-CM

## 2013-11-05 DIAGNOSIS — R0609 Other forms of dyspnea: Secondary | ICD-10-CM | POA: Insufficient documentation

## 2013-11-05 DIAGNOSIS — Z6841 Body Mass Index (BMI) 40.0 and over, adult: Secondary | ICD-10-CM | POA: Insufficient documentation

## 2013-11-05 DIAGNOSIS — R0989 Other specified symptoms and signs involving the circulatory and respiratory systems: Secondary | ICD-10-CM | POA: Diagnosis not present

## 2013-11-14 DIAGNOSIS — G4733 Obstructive sleep apnea (adult) (pediatric): Secondary | ICD-10-CM

## 2013-11-15 ENCOUNTER — Telehealth: Payer: Self-pay | Admitting: Pulmonary Disease

## 2013-11-15 NOTE — Telephone Encounter (Signed)
LMOM x 1 

## 2013-11-15 NOTE — Sleep Study (Signed)
   NAME: Grace Delacruz DATE OF BIRTH:  05-Jan-1946 MEDICAL RECORD NUMBER [id number]  LOCATION: [facility]  PHYSICIAN: [place] OF STUDY: 11/05/2013  SLEEP STUDY TYPE: Nocturnal Polysomnogram               REFERRING PHYSICIAN: Ailie Gage, Armando Reichert, MD  INDICATION FOR STUDY: Hypersomnia with sleep apnea  EPWORTH SLEEPINESS SCORE:  10 HEIGHT: 5\' 5"  (165.1 cm)  WEIGHT: 253 lb (114.76 kg)    Body mass index is 42.1 kg/(m^2).  NECK SIZE: 15 in.  SLEEP ARCHITECTURE: The patient had a total sleep time of 279 minutes with no slow-wave sleep and only 6 minutes of REM. Sleep onset latency was normal at 21 minutes, and REM onset was prolonged at 241 minutes. Sleep efficiency was moderately reduced at 75%.  RESPIRATORY DATA: The patient was found to have no apneas and only 11 obstructive hypopneas, giving her an AHI of 2.4 events per hour. The events were not positional, and there was moderate snoring noted throughout.  OXYGEN DATA: The patient had oxygen desaturation transiently as low as 85% with her obstructive events, but only spent less than 1 minute during the entire night less than 88%.  CARDIAC DATA: Occasional PVC noted, as well as intermittent wide-complex sinus rhythm during the night.  MOVEMENT/PARASOMNIA: The patient had 319 periodic limb movements noted, with 5 per hour resulting in arousal or awakening. It should be noted the majority of these were concentrated in the first half of the night.  IMPRESSION/ RECOMMENDATION:    1) small numbers of obstructive events which do not meet the AHI criteria for the obstructive sleep apnea syndrome. The patient did have moderate snoring and should be encouraged to work aggressively on weight loss.  2) large numbers of periodic limb movements with significant sleep disruption. However the majority of her leg movements occurred during the first half of the night, and clinical correlation is suggested to determine  whether this may be secondary to a primary movement disorder of sleep.  3)  Occasional PVC noted, as well as a wider complex sinus rhythm at times during the night.     [place], American Board of Sleep Medicine  ELECTRONICALLY SIGNED ON:  11/15/2013, 8:46 AM [place] PH: [phone]   FX: [phone] [place]

## 2013-11-15 NOTE — Telephone Encounter (Signed)
Pt schedule for appt with [facility] to review sleep study 11/24/13 at 11am

## 2013-11-15 NOTE — Telephone Encounter (Signed)
Needs ov to review sleep study results.  

## 2013-11-24 ENCOUNTER — Encounter: Payer: Self-pay | Admitting: Pulmonary Disease

## 2013-11-24 ENCOUNTER — Ambulatory Visit (INDEPENDENT_AMBULATORY_CARE_PROVIDER_SITE_OTHER): Payer: Medicare Other | Admitting: Pulmonary Disease

## 2013-11-24 VITALS — BP 122/82 | HR 66 | Temp 97.7°F | Ht 65.0 in | Wt 259.4 lb

## 2013-11-24 DIAGNOSIS — R0989 Other specified symptoms and signs involving the circulatory and respiratory systems: Secondary | ICD-10-CM | POA: Diagnosis not present

## 2013-11-24 DIAGNOSIS — R0609 Other forms of dyspnea: Secondary | ICD-10-CM | POA: Diagnosis not present

## 2013-11-24 DIAGNOSIS — R0683 Snoring: Secondary | ICD-10-CM

## 2013-11-24 NOTE — Progress Notes (Signed)
   Subjective:    Patient ID: Grace Delacruz, female    DOB: [birth year], 67 y.o.   MRN: [id number]  HPI Patient comes in today for followup after her recent sleep study. She was found to have only small numbers of obstructive events which did not meet the criteria for the obstructive sleep apnea syndrome. She had no significant oxygen desaturation as well. She was noted to have 319 periodic limb movements, with significant sleep disruption. However, these were concentrated primarily in the first half of the night. I have reviewed the study with her and her husband, and answered all of her questions.   Review of Systems  Constitutional: Negative for fever and unexpected weight change.  HENT: Positive for rhinorrhea and sneezing. Negative for congestion, dental problem, ear pain, nosebleeds, postnasal drip, sinus pressure, sore throat and trouble swallowing.   Eyes: Negative for redness and itching.  Respiratory: Positive for cough. Negative for chest tightness, shortness of breath and wheezing.   Cardiovascular: Negative for palpitations and leg swelling.  Gastrointestinal: Negative for nausea and vomiting.  Genitourinary: Negative for dysuria.  Musculoskeletal: Negative for joint swelling.  Skin: Negative for rash.  Neurological: Negative for headaches.  Hematological: Does not bruise/bleed easily.  Psychiatric/Behavioral: Negative for dysphoric mood. The patient is not nervous/anxious.        Objective:   Physical Exam Obese female in no acute distress Nose without purulence or discharge noted Neck without lymphadenopathy or thyromegaly Lower extremities with minimal edema, no cyanosis Alert and oriented, moves all 4 extremities.       Assessment & Plan:

## 2013-11-24 NOTE — Patient Instructions (Signed)
The good news is that you do not have sleep apnea.  Work on weight loss to help your snoring. You do have a lot of leg kicks on your sleep study.  This may be related to chronic pain, or could be related to the restless leg syndrome.  Let us know if you would like a short trial of medication to see if this will help your sleep.  followup with me as needed

## 2013-11-24 NOTE — Assessment & Plan Note (Signed)
The patient does not have sleep apnea by her recent sleep study, but was noted to have frequent leg kicks with sleep disruption. The patient really does not have a history consistent with the restless leg syndrome, and she is wondering if her limb movements are related to her chronic pain from her arthritis. She will monitor her symptoms, and consider a trial of a dopamine agonist if she continues to have issues. In the meantime, I have asked her to work aggressively on weight loss, and avoid sleeping supine for her snoring.

## 2013-12-07 ENCOUNTER — Ambulatory Visit: Payer: Medicare Other | Admitting: Cardiology

## 2013-12-08 ENCOUNTER — Encounter (HOSPITAL_COMMUNITY): Payer: Self-pay | Admitting: Pharmacy Technician

## 2013-12-15 ENCOUNTER — Telehealth (INDEPENDENT_AMBULATORY_CARE_PROVIDER_SITE_OTHER): Payer: Self-pay | Admitting: *Deleted

## 2013-12-15 DIAGNOSIS — R609 Edema, unspecified: Secondary | ICD-10-CM | POA: Diagnosis not present

## 2013-12-15 DIAGNOSIS — I10 Essential (primary) hypertension: Secondary | ICD-10-CM | POA: Diagnosis not present

## 2013-12-15 NOTE — Telephone Encounter (Signed)
  Procedure: tcs  Reason/Indication:  Hx polyps, UC  Has patient had this procedure before?  Yes -- TCS 2008 (epic) & flex sig 2009 (scanned)  If so, when, by whom and where?    Is there a family history of colon cancer?  no  Who?  What age when diagnosed?    Is patient diabetic?   yes      Does patient have prosthetic heart valve?  no  Do you have a pacemaker?  no  Has patient ever had endocarditis? no  Has patient had joint replacement within last 12 months?  no  Does patient tend to be constipated or take laxatives? yes  Is patient on Coumadin, Plavix and/or Aspirin? yes  Medications: asa 81 mg daily, prempro 0.625 mg daily, furosemide 40 mg daily, levothyroxine 112 mcg daily, losartan 100 mg daily, simvastatin 40 mg daily, metformin 500 mg daily (am), metoprolol 100 mg daily, fluoxetine 20 mg daily, clonidine 0.2 mg bid, amlodipine 10 mg daily, meclizine 25 mg 1-3 tab daily prn, advil bid  Allergies: keflex  Medication Adjustment: asa 2 days, hold metformin morning of procedure  Procedure date & time: 01/05/14 at 830

## 2013-12-15 NOTE — Telephone Encounter (Signed)
agree

## 2013-12-31 ENCOUNTER — Ambulatory Visit (INDEPENDENT_AMBULATORY_CARE_PROVIDER_SITE_OTHER): Payer: Medicare Other | Admitting: Cardiology

## 2013-12-31 ENCOUNTER — Encounter: Payer: Self-pay | Admitting: Cardiology

## 2013-12-31 VITALS — BP 110/75 | HR 80 | Ht 65.0 in | Wt 256.0 lb

## 2013-12-31 DIAGNOSIS — E119 Type 2 diabetes mellitus without complications: Secondary | ICD-10-CM | POA: Diagnosis not present

## 2013-12-31 DIAGNOSIS — I447 Left bundle-branch block, unspecified: Secondary | ICD-10-CM

## 2013-12-31 DIAGNOSIS — I10 Essential (primary) hypertension: Secondary | ICD-10-CM

## 2013-12-31 MED ORDER — CLONIDINE HCL 0.2 MG PO TABS: 0.1000 mg | ORAL_TABLET | Freq: Every day | ORAL | Status: DC

## 2013-12-31 NOTE — Progress Notes (Signed)
Clinical Summary Ms. Brendlinger is a 68 y.o.female last seen in January of this year. No medication changes were made at that time. She did undergo subsequent evaluation for sleep apnea which was unrevealing.  She is doing well, nearly off of clonidine completely, this is being weaned. Her blood pressure is very well controlled today. She is not reporting any angina. She has purchased an indoor recumbent bicycle for exercise.  Echocardiogram done in August 2013 indicated moderate LVH with LVEF of 17-61%, grade 1 diastolic dysfunction, paradoxical septal motion, mild left atrial enlargement. No major abnormalities. Lexiscan Myoview done in 2013 reports no significant ST segment changes and overall normal perfusion, no clear evidence of ischemia.    Allergies  Allergen Reactions  . Hibiclens [Chlorhexidine Gluconate]     SKIN REDNESS AND BURNING SENSATION  . Keflex [Cephalexin] Itching and Rash    Current Outpatient Prescriptions  Medication Sig Dispense Refill  . amLODipine (NORVASC) 10 MG tablet Take 10 mg by mouth every morning.       Marland Kitchen aspirin 81 MG tablet Take 81 mg by mouth at bedtime.       . bisacodyl (DULCOLAX) 5 MG EC tablet Take 5 mg by mouth daily as needed for moderate constipation.      . cloNIDine (CATAPRES) 0.2 MG tablet Take 0.5 tablets (0.1 mg total) by mouth at bedtime. WILL STOP AFTER FINISH CURRENT SUPPLY      . estrogen, conjugated,-medroxyprogesterone (PREMPRO) 0.625-2.5 MG per tablet Take 1 tablet by mouth daily.       Marland Kitchen FLUoxetine (PROZAC) 20 MG capsule Take 20 mg by mouth every morning.       . furosemide (LASIX) 40 MG tablet Take 40 mg by mouth daily.      Marland Kitchen ibuprofen (ADVIL,MOTRIN) 200 MG tablet Take 400 mg by mouth at bedtime.       Marland Kitchen levothyroxine (SYNTHROID, LEVOTHROID) 112 MCG tablet Take 112 mcg by mouth every morning. IN AM      . losartan (COZAAR) 100 MG tablet Take 1 tablet (100 mg total) by mouth every evening.      . meclizine (ANTIVERT) 25 MG tablet  Take 25 mg by mouth 3 (three) times daily as needed for dizziness.       . metFORMIN (GLUCOPHAGE) 500 MG tablet Take 500 mg by mouth daily with breakfast.       . metoprolol succinate (TOPROL-XL) 100 MG 24 hr tablet Take 100 mg by mouth every morning. Take with or immediately following a meal.      . simvastatin (ZOCOR) 40 MG tablet Take 40 mg by mouth at bedtime.      Marland Kitchen spironolactone (ALDACTONE) 25 MG tablet Take 25 mg by mouth daily.       No current facility-administered medications for this visit.    Past Medical History  Diagnosis Date  . Osteoarthritis   . Essential hypertension, benign   . Type 2 diabetes mellitus   . Hypothyroidism   . Depression   . Anxiety   . Fibrocystic breast disease   . Scoliosis   . DJD (degenerative joint disease)   . Menopause   . Esophageal reflux   . Sleep apnea     Reportedly mild - no CPAP    Social History Ms. Winger reports that she has never smoked. She does not have any smokeless tobacco history on file. Ms. Diegel reports that she drinks alcohol.  Review of Systems Still having left hand pain, will likely go  ahead with surgery. Stable appetite. No bleeding problems. Otherwise negative.  Physical Examination Filed Vitals:   12/31/13 1504  BP: 110/75  Pulse: 80   Filed Weights   12/31/13 1504  Weight: 256 lb (116.121 kg)    Overweight woman in no acute distress.  HEENT: Conjunctiva and lids normal, oropharynx clear.  Neck: Supple, no elevated JVP or carotid bruits, no thyromegaly.  Lungs: Clear to auscultation, nonlabored breathing at rest.  Cardiac: Regular rate and rhythm, no S3 or significant systolic murmur, no pericardial rub.  Abdomen: Soft, nontender, bowel sounds present, no guarding or rebound.  Extremities: No pitting edema, distal pulses 2+.    Problem List and Plan   Essential hypertension, benign Blood pressure control is very good today. Continue to wean completely off clonidine. No change to remaining  regimen. Keep follow with Dr. Nadara Mustard.  Left bundle branch block Transiently noted last year, possibly rate related. Previous cardiac structural and ischemic workup in August 2013 was reassuring. No further workup at this point.  Type 2 diabetes mellitus Keep followup with Dr. Nadara Mustard.    Satira Sark, M.D., F.A.C.C.

## 2013-12-31 NOTE — Assessment & Plan Note (Signed)
Blood pressure control is very good today. Continue to wean completely off clonidine. No change to remaining regimen. Keep follow with Dr. Nadara Mustard.

## 2013-12-31 NOTE — Assessment & Plan Note (Signed)
Keep followup with Dr. Howard. 

## 2013-12-31 NOTE — Assessment & Plan Note (Signed)
Transiently noted last year, possibly rate related. Previous cardiac structural and ischemic workup in August 2013 was reassuring. No further workup at this point.

## 2013-12-31 NOTE — Patient Instructions (Signed)
   Continue to wean off the Clonidine - will stop after finish current supply Continue all other medications.   Your physician wants you to follow up in: 6 months.  You will receive a reminder letter in the mail one-two months in advance.  If you don't receive a letter, please call our office to schedule the follow up appointment

## 2014-01-05 ENCOUNTER — Ambulatory Visit (HOSPITAL_COMMUNITY)
Admission: RE | Admit: 2014-01-05 | Discharge: 2014-01-05 | Disposition: A | Discharge status: Home / Self Care | Payer: Medicare Other | Source: Ambulatory Visit | Attending: Internal Medicine | Admitting: Internal Medicine

## 2014-01-05 ENCOUNTER — Encounter (HOSPITAL_COMMUNITY)
Admission: RE | Disposition: A | Discharge status: Home / Self Care | Payer: Self-pay | Source: Ambulatory Visit | Attending: Internal Medicine

## 2014-01-05 ENCOUNTER — Encounter (HOSPITAL_COMMUNITY): Payer: Self-pay | Admitting: *Deleted

## 2014-01-05 DIAGNOSIS — F329 Major depressive disorder, single episode, unspecified: Secondary | ICD-10-CM | POA: Diagnosis not present

## 2014-01-05 DIAGNOSIS — I10 Essential (primary) hypertension: Secondary | ICD-10-CM | POA: Diagnosis not present

## 2014-01-05 DIAGNOSIS — M4120 Other idiopathic scoliosis, site unspecified: Secondary | ICD-10-CM | POA: Diagnosis not present

## 2014-01-05 DIAGNOSIS — F3289 Other specified depressive episodes: Secondary | ICD-10-CM | POA: Insufficient documentation

## 2014-01-05 DIAGNOSIS — K644 Residual hemorrhoidal skin tags: Secondary | ICD-10-CM | POA: Diagnosis not present

## 2014-01-05 DIAGNOSIS — Z7982 Long term (current) use of aspirin: Secondary | ICD-10-CM | POA: Insufficient documentation

## 2014-01-05 DIAGNOSIS — F411 Generalized anxiety disorder: Secondary | ICD-10-CM | POA: Diagnosis not present

## 2014-01-05 DIAGNOSIS — K5730 Diverticulosis of large intestine without perforation or abscess without bleeding: Secondary | ICD-10-CM | POA: Insufficient documentation

## 2014-01-05 DIAGNOSIS — N6019 Diffuse cystic mastopathy of unspecified breast: Secondary | ICD-10-CM | POA: Insufficient documentation

## 2014-01-05 DIAGNOSIS — Z09 Encounter for follow-up examination after completed treatment for conditions other than malignant neoplasm: Secondary | ICD-10-CM | POA: Diagnosis not present

## 2014-01-05 DIAGNOSIS — K219 Gastro-esophageal reflux disease without esophagitis: Secondary | ICD-10-CM | POA: Insufficient documentation

## 2014-01-05 DIAGNOSIS — Z860100 Personal history of colon polyps, unspecified: Secondary | ICD-10-CM | POA: Insufficient documentation

## 2014-01-05 DIAGNOSIS — K5190 Ulcerative colitis, unspecified, without complications: Secondary | ICD-10-CM

## 2014-01-05 DIAGNOSIS — G4730 Sleep apnea, unspecified: Secondary | ICD-10-CM | POA: Diagnosis not present

## 2014-01-05 DIAGNOSIS — E039 Hypothyroidism, unspecified: Secondary | ICD-10-CM | POA: Insufficient documentation

## 2014-01-05 DIAGNOSIS — M1990 Unspecified osteoarthritis, unspecified site: Secondary | ICD-10-CM | POA: Insufficient documentation

## 2014-01-05 DIAGNOSIS — E119 Type 2 diabetes mellitus without complications: Secondary | ICD-10-CM | POA: Insufficient documentation

## 2014-01-05 DIAGNOSIS — Z780 Asymptomatic menopausal state: Secondary | ICD-10-CM | POA: Insufficient documentation

## 2014-01-05 DIAGNOSIS — Z8719 Personal history of other diseases of the digestive system: Secondary | ICD-10-CM | POA: Diagnosis not present

## 2014-01-05 HISTORY — PX: COLONOSCOPY: SHX5424

## 2014-01-05 LAB — GLUCOSE, CAPILLARY: Glucose-Capillary: 176 mg/dL — ABNORMAL HIGH (ref 70–99)

## 2014-01-05 SURGERY — COLONOSCOPY
Anesthesia: Moderate Sedation

## 2014-01-05 MED ORDER — MIDAZOLAM HCL 5 MG/5ML IJ SOLN
INTRAMUSCULAR | Status: DC | PRN
  Administered 2014-01-05 (×5): 2 mg via INTRAVENOUS

## 2014-01-05 MED ORDER — STERILE WATER FOR IRRIGATION IR SOLN
Status: DC | PRN
  Administered 2014-01-05: 09:00:00

## 2014-01-05 MED ORDER — MEPERIDINE HCL 50 MG/ML IJ SOLN
INTRAMUSCULAR | Status: AC
  Filled 2014-01-05: qty 1

## 2014-01-05 MED ORDER — MEPERIDINE HCL 50 MG/ML IJ SOLN
INTRAMUSCULAR | Status: DC | PRN
  Administered 2014-01-05 (×2): 25 mg via INTRAVENOUS

## 2014-01-05 MED ORDER — MIDAZOLAM HCL 5 MG/5ML IJ SOLN
INTRAMUSCULAR | Status: AC
  Filled 2014-01-05: qty 10

## 2014-01-05 MED ORDER — SODIUM CHLORIDE 0.9 % IV SOLN
INTRAVENOUS | Status: DC
  Administered 2014-01-05: 08:00:00 via INTRAVENOUS

## 2014-01-05 NOTE — Discharge Instructions (Signed)
Resume usual medications. Keep ibuprofen use to minimum. High fiber diet. No driving for 24 hours. Can wait 10 years before next colonoscopy unless you have diarrhea or rectal bleeding.    Colonoscopy, Care After Refer to this sheet in the next few weeks. These instructions provide you with information on caring for yourself after your procedure. Your health care provider may also give you more specific instructions. Your treatment has been planned according to current medical practices, but problems sometimes occur. Call your health care provider if you have any problems or questions after your procedure. WHAT TO EXPECT AFTER THE PROCEDURE  After your procedure, it is typical to have the following:  A small amount of blood in your stool.  Moderate amounts of gas and mild abdominal cramping or bloating. HOME CARE INSTRUCTIONS  Do not drive, operate machinery, or sign important documents for 24 hours.  You may shower and resume your regular physical activities, but move at a slower pace for the first 24 hours.  Take frequent rest periods for the first 24 hours.  Walk around or put a warm pack on your abdomen to help reduce abdominal cramping and bloating.  Drink enough fluids to keep your urine clear or pale yellow.  You may resume your normal diet as instructed by your health care provider. Avoid heavy or fried foods that are hard to digest.  Avoid drinking alcohol for 24 hours or as instructed by your health care provider.  Only take over-the-counter or prescription medicines as directed by your health care provider.  If a tissue sample (biopsy) was taken during your procedure:  Do not take aspirin or blood thinners for 7 days, or as instructed by your health care provider.  Do not drink alcohol for 7 days, or as instructed by your health care provider.  Eat soft foods for the first 24 hours. SEEK MEDICAL CARE IF: You have persistent spotting of blood in your stool 2 3 days  after the procedure. SEEK IMMEDIATE MEDICAL CARE IF:  You have more than a small spotting of blood in your stool.  You pass large blood clots in your stool.  Your abdomen is swollen (distended).  You have nausea or vomiting.  You have a fever.  You have increasing abdominal pain that is not relieved with medicine. Document Released: 05/21/2004 Document Revised: 07/28/2013 Document Reviewed: 06/14/2013 [facility] Patient Information 2014 [place], [place]. Hemorrhoids Hemorrhoids are swollen veins around the rectum or anus. There are two types of hemorrhoids:   Internal hemorrhoids. These occur in the veins just inside the rectum. They may poke through to the outside and become irritated and painful.  External hemorrhoids. These occur in the veins outside the anus and can be felt as a painful swelling or hard lump near the anus. CAUSES  Pregnancy.   Obesity.   Constipation or diarrhea.   Straining to have a bowel movement.   Sitting for long periods on the toilet.  Heavy lifting or other activity that caused you to strain.  Anal intercourse. SYMPTOMS   Pain.   Anal itching or irritation.   Rectal bleeding.   Fecal leakage.   Anal swelling.   One or more lumps around the anus.  DIAGNOSIS  Your caregiver may be able to diagnose hemorrhoids by visual examination. Other examinations or tests that may be performed include:   Examination of the rectal area with a gloved hand (digital rectal exam).   Examination of anal canal using a small tube (scope).  A blood test if you have lost a significant amount of blood.  A test to look inside the colon (sigmoidoscopy or colonoscopy). TREATMENT Most hemorrhoids can be treated at home. However, if symptoms do not seem to be getting better or if you have a lot of rectal bleeding, your caregiver may perform a procedure to help make the hemorrhoids get smaller or remove them completely. Possible treatments include:    Placing a rubber band at the base of the hemorrhoid to cut off the circulation (rubber band ligation).   Injecting a chemical to shrink the hemorrhoid (sclerotherapy).   Using a tool to burn the hemorrhoid (infrared light therapy).   Surgically removing the hemorrhoid (hemorrhoidectomy).   Stapling the hemorrhoid to block blood flow to the tissue (hemorrhoid stapling).  HOME CARE INSTRUCTIONS   Eat foods with fiber, such as whole grains, beans, nuts, fruits, and vegetables. Ask your doctor about taking products with added fiber in them (fibersupplements).  Increase fluid intake. Drink enough water and fluids to keep your urine clear or pale yellow.   Exercise regularly.   Go to the bathroom when you have the urge to have a bowel movement. Do not wait.   Avoid straining to have bowel movements.   Keep the anal area dry and clean. Use wet toilet paper or moist towelettes after a bowel movement.   Medicated creams and suppositories may be used or applied as directed.   Only take over-the-counter or prescription medicines as directed by your caregiver.   Take warm sitz baths for 15 20 minutes, 3 4 times a day to ease pain and discomfort.   Place ice packs on the hemorrhoids if they are tender and swollen. Using ice packs between sitz baths may be helpful.   Put ice in a plastic bag.   Place a towel between your skin and the bag.   Leave the ice on for 15 20 minutes, 3 4 times a day.   Do not use a donut-shaped pillow or sit on the toilet for long periods. This increases blood pooling and pain.  SEEK MEDICAL CARE IF:  You have increasing pain and swelling that is not controlled by treatment or medicine.  You have uncontrolled bleeding.  You have difficulty or you are unable to have a bowel movement.  You have pain or inflammation outside the area of the hemorrhoids. MAKE SURE YOU:  Understand these instructions.  Will watch your condition.  Will  get help right away if you are not doing well or get worse. Document Released: 10/04/2000 Document Revised: 09/23/2012 Document Reviewed: 08/11/2012 [facility] Patient Information 2014 [place]. High-Fiber Diet Fiber is found in fruits, vegetables, and grains. A high-fiber diet encourages the addition of more whole grains, legumes, fruits, and vegetables in your diet. The recommended amount of fiber for adult males is 38 g per day. For adult females, it is 25 g per day. Pregnant and lactating women should get 28 g of fiber per day. If you have a digestive or bowel problem, ask your caregiver for advice before adding high-fiber foods to your diet. Eat a variety of high-fiber foods instead of only a select few type of foods.  PURPOSE  To increase stool bulk.  To make bowel movements more regular to prevent constipation.  To lower cholesterol.  To prevent overeating. WHEN IS THIS DIET USED?  It may be used if you have constipation and hemorrhoids.  It may be used if you have uncomplicated diverticulosis (intestine condition)  and irritable bowel syndrome.  It may be used if you need help with weight management.  It may be used if you want to add it to your diet as a protective measure against atherosclerosis, diabetes, and cancer. SOURCES OF FIBER  Whole-grain breads and cereals.  Fruits, such as apples, oranges, bananas, berries, prunes, and pears.  Vegetables, such as green peas, carrots, sweet potatoes, beets, broccoli, cabbage, spinach, and artichokes.  Legumes, such split peas, soy, lentils.  Almonds. FIBER CONTENT IN FOODS Starches and Grains / Dietary Fiber (g)  Cheerios, 1 cup / 3 g  Corn Flakes cereal, 1 cup / 0.7 g  Rice crispy treat cereal, 1 cup / 0.3 g  Instant oatmeal (cooked),  cup / 2 g  Frosted wheat cereal, 1 cup / 5.1 g  Brown, long-grain rice (cooked), 1 cup / 3.5 g  White, long-grain rice (cooked), 1 cup / 0.6 g  Enriched macaroni (cooked), 1  cup / 2.5 g Legumes / Dietary Fiber (g)  Baked beans (canned, plain, or vegetarian),  cup / 5.2 g  Kidney beans (canned),  cup / 6.8 g  Pinto beans (cooked),  cup / 5.5 g Breads and Crackers / Dietary Fiber (g)  Plain or honey graham crackers, 2 squares / 0.7 g  Saltine crackers, 3 squares / 0.3 g  Plain, salted pretzels, 10 pieces / 1.8 g  Whole-wheat bread, 1 slice / 1.9 g  White bread, 1 slice / 0.7 g  Raisin bread, 1 slice / 1.2 g  Plain bagel, 3 oz / 2 g  Flour tortilla, 1 oz / 0.9 g  Corn tortilla, 1 small / 1.5 g  Hamburger or hotdog bun, 1 small / 0.9 g Fruits / Dietary Fiber (g)  Apple with skin, 1 medium / 4.4 g  Sweetened applesauce,  cup / 1.5 g  Banana,  medium / 1.5 g  Grapes, 10 grapes / 0.4 g  Orange, 1 small / 2.3 g  Raisin, 1.5 oz / 1.6 g  Melon, 1 cup / 1.4 g Vegetables / Dietary Fiber (g)  Green beans (canned),  cup / 1.3 g  Carrots (cooked),  cup / 2.3 g  Broccoli (cooked),  cup / 2.8 g  Peas (cooked),  cup / 4.4 g  Mashed potatoes,  cup / 1.6 g  Lettuce, 1 cup / 0.5 g  Corn (canned),  cup / 1.6 g  Tomato,  cup / 1.1 g Document Released: 10/07/2005 Document Revised: 04/07/2012 Document Reviewed: 01/09/2012 [facility] Patient Information 2014 [place], [place]. Diverticulosis Diverticulosis is a common condition that develops when small pouches (diverticula) form in the wall of the colon. The risk of diverticulosis increases with age. It happens more often in people who eat a low-fiber diet. Most individuals with diverticulosis have no symptoms. Those individuals with symptoms usually experience abdominal pain, constipation, or loose stools (diarrhea). HOME CARE INSTRUCTIONS   Increase the amount of fiber in your diet as directed by your caregiver or dietician. This may reduce symptoms of diverticulosis.  Your caregiver may recommend taking a dietary fiber supplement.  Drink at least 6 to 8 glasses of water each day to  prevent constipation.  Try not to strain when you have a bowel movement.  Your caregiver may recommend avoiding nuts and seeds to prevent complications, although this is still an uncertain benefit.  Only take over-the-counter or prescription medicines for pain, discomfort, or fever as directed by your caregiver. FOODS WITH HIGH FIBER CONTENT INCLUDE:  Fruits. Apple,  peach, pear, tangerine, raisins, prunes.  Vegetables. Brussels sprouts, asparagus, broccoli, cabbage, carrot, cauliflower, romaine lettuce, spinach, summer squash, tomato, winter squash, zucchini.  Starchy Vegetables. Baked beans, kidney beans, lima beans, split peas, lentils, potatoes (with skin).  Grains. Whole wheat bread, brown rice, bran flake cereal, plain oatmeal, white rice, shredded wheat, bran muffins. SEEK IMMEDIATE MEDICAL CARE IF:   You develop increasing pain or severe bloating.  You have an oral temperature above 102 F (38.9 C), not controlled by medicine.  You develop vomiting or bowel movements that are bloody or black. Document Released: 07/04/2004 Document Revised: 12/30/2011 Document Reviewed: 03/07/2010 [facility] Patient Information 2014 [place].

## 2014-01-05 NOTE — H&P (Signed)
Grace Delacruz is an 68 y.o. female.   Chief Complaint: Patient is here for colonoscopy. HPI: Patient is 89-year-old Caucasian female who has history of ulcerative colitis. She was diagnosed in June 2008. She was treated with mesalamine. She had flexible sigmoidoscopy in November 2009 revealing normal mucosa. She stopped mesalamine over 5 years ago and she has had no problems. She is not having constipation positive antihypertensives. She denies melena rectal bleeding or abdominal pain. She has good appetite her weight has been stable. Her initial colonoscopy of June 2008 she also had single small tubular adenoma removed. History is negative for CRC and IBD  Past Medical History  Diagnosis Date  . Osteoarthritis   . Essential hypertension, benign   . Type 2 diabetes mellitus   . Hypothyroidism   . Depression   . Anxiety   . Fibrocystic breast disease   . Scoliosis   . DJD (degenerative joint disease)   . Menopause   . Esophageal reflux   . Sleep apnea     Reportedly mild - no CPAP    Past Surgical History  Procedure Laterality Date  . Knee arthroplasty  2001    Right  . Knee arthroplasty  2002    Left  . Cesarean section    . Cholecystectomy    . Left leg vein resection    . Joint fusion-foot      x2 LEFT FOOT  . Rotator cuff repair      LEFT  . Breast biopsy    . Thyroidectomy    . Tonsillectomy    . Colonoscopy  03/26/2007    RMR:  Diffuse changes of proctocolitis as described above diminutive rectal polyp, cold biopsy/removed, pedunculated polyp of the splenic flexure hot snare removed.  Segmental biopsies the colon taken of the ileocecal valve ulcer biopsied normal terminal ileum  . Cataract extraction Bilateral     Family History  Problem Relation Age of Onset  . Hypertension Mother   . Heart failure Father     Died with pneumonia  . Hypertension Father   . Arthritis Father   . Diabetes Father    Social History:  reports that she has never smoked. She does  not have any smokeless tobacco history on file. She reports that she drinks alcohol. She reports that she does not use illicit drugs.  Allergies:  Allergies  Allergen Reactions  . Hibiclens [Chlorhexidine Gluconate]     SKIN REDNESS AND BURNING SENSATION  . Keflex [Cephalexin] Itching and Rash    Medications Prior to Admission  Medication Sig Dispense Refill  . amLODipine (NORVASC) 10 MG tablet Take 10 mg by mouth every morning.       Marland Kitchen aspirin 81 MG tablet Take 81 mg by mouth at bedtime.       . bisacodyl (DULCOLAX) 5 MG EC tablet Take 5 mg by mouth daily as needed for moderate constipation.      . cloNIDine (CATAPRES) 0.2 MG tablet Take 0.5 tablets (0.1 mg total) by mouth at bedtime. WILL STOP AFTER FINISH CURRENT SUPPLY      . estrogen, conjugated,-medroxyprogesterone (PREMPRO) 0.625-2.5 MG per tablet Take 1 tablet by mouth daily.       Marland Kitchen FLUoxetine (PROZAC) 20 MG capsule Take 20 mg by mouth every morning.       . furosemide (LASIX) 40 MG tablet Take 40 mg by mouth daily.      Marland Kitchen ibuprofen (ADVIL,MOTRIN) 200 MG tablet Take 400 mg by mouth at bedtime.       Marland Kitchen  levothyroxine (SYNTHROID, LEVOTHROID) 112 MCG tablet Take 112 mcg by mouth every morning. IN AM      . losartan (COZAAR) 100 MG tablet Take 1 tablet (100 mg total) by mouth every evening.      . metFORMIN (GLUCOPHAGE) 500 MG tablet Take 500 mg by mouth daily with breakfast.       . metoprolol succinate (TOPROL-XL) 100 MG 24 hr tablet Take 100 mg by mouth every morning. Take with or immediately following a meal.      . simvastatin (ZOCOR) 40 MG tablet Take 40 mg by mouth at bedtime.      Marland Kitchen spironolactone (ALDACTONE) 25 MG tablet Take 25 mg by mouth daily.      . meclizine (ANTIVERT) 25 MG tablet Take 25 mg by mouth 3 (three) times daily as needed for dizziness.         No results found for this or any previous visit (from the past 48 hour(s)). No results found.  ROS  Blood pressure 142/69, pulse 77, temperature 97.6 F (36.4  C), resp. rate 20, height 5\' 5"  (1.651 m), weight 256 lb (116.121 kg), SpO2 100.00%. Physical Exam  Constitutional: She appears well-developed and well-nourished.  HENT:  Mouth/Throat: Oropharynx is clear and moist.  Eyes: Conjunctivae are normal. No scleral icterus.  Neck: No thyromegaly present.  Cardiovascular: Normal rate, regular rhythm and normal heart sounds.   No murmur heard. Respiratory: Effort normal and breath sounds normal.  GI: Soft. She exhibits no distension and no mass. There is no tenderness.  Musculoskeletal: She exhibits no edema.  Lymphadenopathy:    She has no cervical adenopathy.  Neurological: She is alert.  Skin: Skin is warm and dry.     Assessment/Plan History of ulcerative colitis. History of colonic adenoma. Patient remains in remission off therapy. Surveillance colonoscopy.  [name] U 01/05/2014, 8:25 AM

## 2014-01-05 NOTE — Op Note (Signed)
COLONOSCOPY PROCEDURE REPORT  PATIENT:  Grace Delacruz  MR#:  [id number] Birthdate:  07-23-1946, 68 y.o., female Endoscopist:  Dr. Rogene Houston, MD Referred By:  Dr. Rory Percy, MD Procedure Date: 01/05/2014  Procedure:   Colonoscopy  Indications:  Patient is 68 year old Caucasian female who was diagnosed with ulcerative colitis back in June 2008 when she also had small tubular adenoma removed from her colon. However she's been off mesalamine for 5 years or longer and has been symptom free. She is here for surveillance colonoscopy. History is negative for CRC or IBD.  Informed Consent:  The procedure and risks were reviewed with the patient and informed consent was obtained.  Medications:  Demerol 50 mg IV Versed 10 mg IV  Description of procedure:  After a digital rectal exam was performed, that colonoscope was advanced from the anus through the rectum and colon to the area of the cecum, ileocecal valve and appendiceal orifice. The cecum was deeply intubated. These structures were well-seen and photographed for the record. From the level of the cecum and ileocecal valve, the scope was slowly and cautiously withdrawn. The mucosal surfaces were carefully surveyed utilizing scope tip to flexion to facilitate fold flattening as needed. The scope was pulled down into the rectum where a thorough exam including retroflexion was performed.  Findings:   Prep excellent. Normal mucosa of various segments of colon and rectum. Scattered diverticula noted at sigmoid colon. Small hemorrhoids below the dentate line and thickened anoderm.   Therapeutic/Diagnostic Maneuvers Performed:   None  Complications:  None  Cecal Withdrawal Time:  9 minutes  Impression:  Examination performed to cecum. No evidence of endoscopic colitis or scarring. Mild sigmoid colon diverticulosis. External hemorrhoids.  Comment; Patient does not have evidence of active colitis. She was diagnosed at [facility] in June  2008 she's been off therapy for several years and in retrospect it would appear she did not have UC.  Recommendations:  Standard instructions given. High fiber diet. Next colonoscopy in 10 years.  Letetia Romanello U  01/05/2014 9:01 AM  CC: Dr. Rory Percy, MD & Dr. Rayne Du ref. provider found

## 2014-01-06 ENCOUNTER — Encounter (HOSPITAL_COMMUNITY): Payer: Self-pay | Admitting: Internal Medicine

## 2014-01-07 DIAGNOSIS — R002 Palpitations: Secondary | ICD-10-CM | POA: Diagnosis not present

## 2014-01-27 DIAGNOSIS — N289 Disorder of kidney and ureter, unspecified: Secondary | ICD-10-CM | POA: Diagnosis not present

## 2014-01-27 DIAGNOSIS — K5289 Other specified noninfective gastroenteritis and colitis: Secondary | ICD-10-CM | POA: Diagnosis not present

## 2014-01-27 DIAGNOSIS — R1013 Epigastric pain: Secondary | ICD-10-CM | POA: Diagnosis not present

## 2014-01-27 DIAGNOSIS — M129 Arthropathy, unspecified: Secondary | ICD-10-CM | POA: Diagnosis not present

## 2014-01-27 DIAGNOSIS — Z79899 Other long term (current) drug therapy: Secondary | ICD-10-CM | POA: Diagnosis not present

## 2014-01-31 DIAGNOSIS — E860 Dehydration: Secondary | ICD-10-CM | POA: Diagnosis not present

## 2014-01-31 DIAGNOSIS — K5289 Other specified noninfective gastroenteritis and colitis: Secondary | ICD-10-CM | POA: Diagnosis not present

## 2014-02-18 DIAGNOSIS — Z1231 Encounter for screening mammogram for malignant neoplasm of breast: Secondary | ICD-10-CM | POA: Diagnosis not present

## 2014-02-28 DIAGNOSIS — M19049 Primary osteoarthritis, unspecified hand: Secondary | ICD-10-CM | POA: Diagnosis not present

## 2014-03-01 ENCOUNTER — Other Ambulatory Visit: Payer: Self-pay | Admitting: Orthopedic Surgery

## 2014-03-03 ENCOUNTER — Encounter (HOSPITAL_BASED_OUTPATIENT_CLINIC_OR_DEPARTMENT_OTHER): Payer: Self-pay | Admitting: *Deleted

## 2014-03-03 ENCOUNTER — Encounter (HOSPITAL_BASED_OUTPATIENT_CLINIC_OR_DEPARTMENT_OTHER)
Admission: RE | Admit: 2014-03-03 | Discharge: 2014-03-03 | Disposition: A | Discharge status: Home / Self Care | Payer: Medicare Other | Source: Ambulatory Visit | Attending: Orthopedic Surgery | Admitting: Orthopedic Surgery

## 2014-03-03 DIAGNOSIS — I10 Essential (primary) hypertension: Secondary | ICD-10-CM | POA: Diagnosis not present

## 2014-03-03 DIAGNOSIS — M24849 Other specific joint derangements of unspecified hand, not elsewhere classified: Secondary | ICD-10-CM | POA: Diagnosis not present

## 2014-03-03 DIAGNOSIS — F329 Major depressive disorder, single episode, unspecified: Secondary | ICD-10-CM | POA: Diagnosis not present

## 2014-03-03 DIAGNOSIS — E119 Type 2 diabetes mellitus without complications: Secondary | ICD-10-CM | POA: Diagnosis not present

## 2014-03-03 DIAGNOSIS — F3289 Other specified depressive episodes: Secondary | ICD-10-CM | POA: Diagnosis not present

## 2014-03-03 DIAGNOSIS — M19049 Primary osteoarthritis, unspecified hand: Secondary | ICD-10-CM | POA: Diagnosis not present

## 2014-03-03 DIAGNOSIS — K219 Gastro-esophageal reflux disease without esophagitis: Secondary | ICD-10-CM | POA: Diagnosis not present

## 2014-03-03 DIAGNOSIS — E890 Postprocedural hypothyroidism: Secondary | ICD-10-CM | POA: Diagnosis not present

## 2014-03-03 DIAGNOSIS — F411 Generalized anxiety disorder: Secondary | ICD-10-CM | POA: Diagnosis not present

## 2014-03-03 LAB — BASIC METABOLIC PANEL
BUN: 25 mg/dL — AB (ref 6–23)
CALCIUM: 9.9 mg/dL (ref 8.4–10.5)
CO2: 24 meq/L (ref 19–32)
CREATININE: 1.01 mg/dL (ref 0.50–1.10)
Chloride: 103 mEq/L (ref 96–112)
GFR calc Af Amer: 66 mL/min — ABNORMAL LOW (ref >90)
GFR calc non Af Amer: 57 mL/min — ABNORMAL LOW (ref >90)
GLUCOSE: 146 mg/dL — AB (ref 70–99)
Potassium: 4.9 mEq/L (ref 3.7–5.3)
SODIUM: 138 meq/L (ref 137–147)

## 2014-03-03 NOTE — Progress Notes (Signed)
Pt here for bmet-ekg done 11/14-had an episode LBBB post op -had to be sent to cardiology to eval-it only lasted short tome-none since

## 2014-03-07 NOTE — H&P (Signed)
Grace Delacruz is an 68 y.o. female.   Chief Complaint: c/o chronic and progressive pain left thumb CMC joint HPI: Patient is a well known long term patient with a history of progressive pain and loss of motion to the left thumb CMC joint. She has tried all of the known conservative measures without relief. She now wishes to proceed with surgery to the thumb.  Past Medical History  Diagnosis Date  . Osteoarthritis   . Essential hypertension, benign   . Type 2 diabetes mellitus   . Hypothyroidism   . Depression   . Anxiety   . Fibrocystic breast disease   . Scoliosis   . DJD (degenerative joint disease)   . Menopause   . Esophageal reflux   . Sleep apnea     Reportedly mild - no CPAP  . Dysrhythmia     went into lbbb post op 2013-resolved-none since  . Complication of anesthesia     hr sometimes drops    Past Surgical History  Procedure Laterality Date  . Knee arthroplasty  2001    Right  . Knee arthroplasty  2002    Left  . Cesarean section    . Cholecystectomy    . Left leg vein resection    . Joint fusion-foot      x2 LEFT FOOT  . Rotator cuff repair      LEFT  . Thyroidectomy  1980    3/4  . Tonsillectomy    . Colonoscopy  03/26/2007    RMR:  Diffuse changes of proctocolitis as described above diminutive rectal polyp, cold biopsy/removed, pedunculated polyp of the splenic flexure hot snare removed.  Segmental biopsies the colon taken of the ileocecal valve ulcer biopsied normal terminal ileum  . Cataract extraction Bilateral   . Colonoscopy N/A 01/05/2014    Procedure: COLONOSCOPY;  Surgeon: Rogene Houston, MD;  Location: [facility];  Service: Endoscopy;  Laterality: N/A;  830  . Breast biopsy      lt x3    Family History  Problem Relation Age of Onset  . Hypertension Mother   . Heart failure Father     Died with pneumonia  . Hypertension Father   . Arthritis Father   . Diabetes Father    Social History:  reports that she has never smoked. She does  not have any smokeless tobacco history on file. She reports that she drinks alcohol. She reports that she does not use illicit drugs.  Allergies:  Allergies  Allergen Reactions  . Ephedrine     Heart rate fast-  . Hibiclens [Chlorhexidine Gluconate]     SKIN REDNESS AND BURNING SENSATION  . Keflex [Cephalexin] Itching and Rash    No prescriptions prior to admission    No results found for this or any previous visit (from the past 48 hour(s)).  No results found.   Pertinent items are noted in HPI.  Height 5\' 5"  (1.651 m), weight 115.667 kg (255 lb).  General appearance: alert Head: Normocephalic, without obvious abnormality Neck: supple, symmetrical, trachea midline Resp: clear to auscultation bilaterally Cardio: regular rate and rhythm GI: normal findings: bowel sounds normal Extremities: . Inspection of her hands reveals shouldering of her thumb CMC joints bilaterally. She has mild hyperextension of her MP joints. She has pain with CMC translation and grind. She has full ROM of her fingers in flexion/extension. Her pulses and capillary refill are intact. Her motor and sensory exam is unremarkable X-rays of her left thumb  AP, lateral and tangential Grace Delacruz oblique demonstrates Eaton stage III CMC arthrosis with bone on bone arthropathy. She has large cyst formation and reactive bone formation in the subchondral bone of the thumb metacarpal and trapezium.   Pulses: 2+ and symmetric Skin: normal Neurologic: Grossly normal    Assessment/Plan Impression: Left thumb CMC end stage OA  Plan: To the OR for left trapezium excision with knotted tendon interposition arthroplasty. The procedure, risks,benefits and post-op course were discussed with the patient at length and they were in agreement with the plan.  [name] 03/07/2014, 2:42 PM  H&P documentation: 03/08/2014  -History and Physical Reviewed  -Patient has been re-examined  -No change in the plan of  care  [name], MD

## 2014-03-08 ENCOUNTER — Encounter (HOSPITAL_BASED_OUTPATIENT_CLINIC_OR_DEPARTMENT_OTHER): Payer: Medicare Other | Admitting: Anesthesiology

## 2014-03-08 ENCOUNTER — Encounter (HOSPITAL_BASED_OUTPATIENT_CLINIC_OR_DEPARTMENT_OTHER)
Admission: RE | Disposition: A | Discharge status: Home / Self Care | Payer: Self-pay | Source: Ambulatory Visit | Attending: Orthopedic Surgery

## 2014-03-08 ENCOUNTER — Ambulatory Visit (HOSPITAL_BASED_OUTPATIENT_CLINIC_OR_DEPARTMENT_OTHER): Payer: Medicare Other | Admitting: Anesthesiology

## 2014-03-08 ENCOUNTER — Encounter (HOSPITAL_BASED_OUTPATIENT_CLINIC_OR_DEPARTMENT_OTHER): Payer: Self-pay

## 2014-03-08 ENCOUNTER — Ambulatory Visit (HOSPITAL_BASED_OUTPATIENT_CLINIC_OR_DEPARTMENT_OTHER)
Admission: RE | Admit: 2014-03-08 | Discharge: 2014-03-08 | Disposition: A | Discharge status: Home / Self Care | Payer: Medicare Other | Source: Ambulatory Visit | Attending: Orthopedic Surgery | Admitting: Orthopedic Surgery

## 2014-03-08 DIAGNOSIS — G8918 Other acute postprocedural pain: Secondary | ICD-10-CM | POA: Diagnosis not present

## 2014-03-08 DIAGNOSIS — F3289 Other specified depressive episodes: Secondary | ICD-10-CM | POA: Insufficient documentation

## 2014-03-08 DIAGNOSIS — F411 Generalized anxiety disorder: Secondary | ICD-10-CM | POA: Insufficient documentation

## 2014-03-08 DIAGNOSIS — F329 Major depressive disorder, single episode, unspecified: Secondary | ICD-10-CM | POA: Diagnosis not present

## 2014-03-08 DIAGNOSIS — M24849 Other specific joint derangements of unspecified hand, not elsewhere classified: Secondary | ICD-10-CM | POA: Insufficient documentation

## 2014-03-08 DIAGNOSIS — I10 Essential (primary) hypertension: Secondary | ICD-10-CM | POA: Diagnosis not present

## 2014-03-08 DIAGNOSIS — E119 Type 2 diabetes mellitus without complications: Secondary | ICD-10-CM | POA: Insufficient documentation

## 2014-03-08 DIAGNOSIS — M19049 Primary osteoarthritis, unspecified hand: Secondary | ICD-10-CM | POA: Diagnosis not present

## 2014-03-08 DIAGNOSIS — M12139 Kaschin-Beck disease, unspecified wrist: Secondary | ICD-10-CM | POA: Diagnosis not present

## 2014-03-08 DIAGNOSIS — E890 Postprocedural hypothyroidism: Secondary | ICD-10-CM | POA: Insufficient documentation

## 2014-03-08 DIAGNOSIS — K219 Gastro-esophageal reflux disease without esophagitis: Secondary | ICD-10-CM | POA: Diagnosis not present

## 2014-03-08 DIAGNOSIS — M25549 Pain in joints of unspecified hand: Secondary | ICD-10-CM | POA: Diagnosis not present

## 2014-03-08 HISTORY — PX: CARPOMETACARPEL SUSPENSION PLASTY: SHX5005

## 2014-03-08 HISTORY — DX: Other specified postprocedural states: Z98.890

## 2014-03-08 HISTORY — DX: Other specified postprocedural states: R11.2

## 2014-03-08 LAB — POCT HEMOGLOBIN-HEMACUE: HEMOGLOBIN: 14.9 g/dL (ref 12.0–15.0)

## 2014-03-08 LAB — GLUCOSE, CAPILLARY
GLUCOSE-CAPILLARY: 126 mg/dL — AB (ref 70–99)
Glucose-Capillary: 169 mg/dL — ABNORMAL HIGH (ref 70–99)

## 2014-03-08 SURGERY — CARPOMETACARPEL (CMC) SUSPENSION PLASTY
Anesthesia: General | Site: Thumb | Laterality: Left

## 2014-03-08 MED ORDER — ONDANSETRON HCL 4 MG/2ML IJ SOLN: INTRAMUSCULAR | Status: DC | PRN

## 2014-03-08 MED ORDER — MIDAZOLAM HCL 2 MG/2ML IJ SOLN
INTRAMUSCULAR | Status: AC
  Filled 2014-03-08: qty 2

## 2014-03-08 MED ORDER — FENTANYL CITRATE 0.05 MG/ML IJ SOLN
INTRAMUSCULAR | Status: DC | PRN
  Administered 2014-03-08: 100 ug via INTRAVENOUS

## 2014-03-08 MED ORDER — OXYCODONE HCL 5 MG PO TABS: 5.0000 mg | ORAL_TABLET | Freq: Once | ORAL | Status: DC | PRN

## 2014-03-08 MED ORDER — ONDANSETRON HCL 4 MG/2ML IJ SOLN
INTRAMUSCULAR | Status: DC | PRN
  Administered 2014-03-08: 4 mg via INTRAVENOUS

## 2014-03-08 MED ORDER — PROPOFOL 10 MG/ML IV BOLUS
INTRAVENOUS | Status: DC | PRN
  Administered 2014-03-08: 100 mg via INTRAVENOUS
  Administered 2014-03-08: 200 mg via INTRAVENOUS

## 2014-03-08 MED ORDER — BUPIVACAINE-EPINEPHRINE (PF) 0.5% -1:200000 IJ SOLN
INTRAMUSCULAR | Status: DC | PRN
  Administered 2014-03-08: 30 mL

## 2014-03-08 MED ORDER — PROMETHAZINE HCL 25 MG/ML IJ SOLN: 6.2500 mg | INTRAMUSCULAR | Status: DC | PRN

## 2014-03-08 MED ORDER — SCOPOLAMINE 1 MG/3DAYS TD PT72
1.0000 | MEDICATED_PATCH | TRANSDERMAL | Status: DC
  Administered 2014-03-08: 1.5 mg via TRANSDERMAL

## 2014-03-08 MED ORDER — DOXYCYCLINE HYCLATE 100 MG PO TABS: 100.0000 mg | ORAL_TABLET | Freq: Two times a day (BID) | ORAL | Status: DC

## 2014-03-08 MED ORDER — MEPERIDINE HCL 25 MG/ML IJ SOLN: 6.2500 mg | INTRAMUSCULAR | Status: DC | PRN

## 2014-03-08 MED ORDER — VANCOMYCIN HCL 10 G IV SOLR
1500.0000 mg | INTRAVENOUS | Status: AC
  Administered 2014-03-08: 1500 mg via INTRAVENOUS

## 2014-03-08 MED ORDER — MIDAZOLAM HCL 2 MG/2ML IJ SOLN
1.0000 mg | INTRAMUSCULAR | Status: DC | PRN
  Administered 2014-03-08: 1 mg via INTRAVENOUS

## 2014-03-08 MED ORDER — EPHEDRINE SULFATE 50 MG/ML IJ SOLN
INTRAMUSCULAR | Status: DC | PRN
  Administered 2014-03-08 (×2): 10 mg via INTRAVENOUS

## 2014-03-08 MED ORDER — MIDAZOLAM HCL 2 MG/2ML IJ SOLN: 0.5000 mg | Freq: Once | INTRAMUSCULAR | Status: DC | PRN

## 2014-03-08 MED ORDER — FENTANYL CITRATE 0.05 MG/ML IJ SOLN
INTRAMUSCULAR | Status: AC
  Filled 2014-03-08: qty 2

## 2014-03-08 MED ORDER — HYDROMORPHONE HCL PF 1 MG/ML IJ SOLN: 0.2500 mg | INTRAMUSCULAR | Status: DC | PRN

## 2014-03-08 MED ORDER — VANCOMYCIN HCL IN DEXTROSE 1-5 GM/200ML-% IV SOLN
INTRAVENOUS | Status: AC
  Filled 2014-03-08: qty 400

## 2014-03-08 MED ORDER — POVIDONE-IODINE 7.5 % EX SOLN: Freq: Once | CUTANEOUS | Status: DC

## 2014-03-08 MED ORDER — LIDOCAINE HCL (CARDIAC) 20 MG/ML IV SOLN
INTRAVENOUS | Status: DC | PRN
  Administered 2014-03-08: 40 mg via INTRAVENOUS

## 2014-03-08 MED ORDER — FENTANYL CITRATE 0.05 MG/ML IJ SOLN
50.0000 ug | INTRAMUSCULAR | Status: DC | PRN
  Administered 2014-03-08: 50 ug via INTRAVENOUS

## 2014-03-08 MED ORDER — LIDOCAINE-EPINEPHRINE (PF) 1.5 %-1:200000 IJ SOLN
INTRAMUSCULAR | Status: DC | PRN
  Administered 2014-03-08: 25 mL via PERINEURAL

## 2014-03-08 MED ORDER — LACTATED RINGERS IV SOLN
INTRAVENOUS | Status: DC
Start: 2014-03-08 — End: 2014-03-08
  Administered 2014-03-08 (×2): via INTRAVENOUS

## 2014-03-08 MED ORDER — OXYCODONE-ACETAMINOPHEN 5-325 MG PO TABS: ORAL_TABLET | ORAL | Status: DC

## 2014-03-08 MED ORDER — SCOPOLAMINE 1 MG/3DAYS TD PT72
MEDICATED_PATCH | TRANSDERMAL | Status: AC
  Filled 2014-03-08: qty 1

## 2014-03-08 MED ORDER — DEXAMETHASONE SODIUM PHOSPHATE 10 MG/ML IJ SOLN
INTRAMUSCULAR | Status: DC | PRN
  Administered 2014-03-08: 5 mg via INTRAVENOUS

## 2014-03-08 MED ORDER — OXYCODONE HCL 5 MG/5ML PO SOLN: 5.0000 mg | Freq: Once | ORAL | Status: DC | PRN

## 2014-03-08 SURGICAL SUPPLY — 58 items
BAG DECANTER FOR FLEXI CONT (MISCELLANEOUS) IMPLANT
BANDAGE ADH SHEER 1  50/CT (GAUZE/BANDAGES/DRESSINGS) IMPLANT
BANDAGE COBAN STERILE 2 (GAUZE/BANDAGES/DRESSINGS) IMPLANT
BANDAGE ELASTIC 3 VELCRO ST LF (GAUZE/BANDAGES/DRESSINGS) ×2 IMPLANT
BLADE 15 SAFETY STRL DISP (BLADE) ×3 IMPLANT
BLADE MINI RND TIP GREEN BEAV (BLADE) ×3 IMPLANT
BNDG CMPR 9X4 STRL LF SNTH (GAUZE/BANDAGES/DRESSINGS) ×1
BNDG CMPR MD 5X2 ELC HKLP STRL (GAUZE/BANDAGES/DRESSINGS)
BNDG COHESIVE 3X5 TAN STRL LF (GAUZE/BANDAGES/DRESSINGS) IMPLANT
BNDG ELASTIC 2 VLCR STRL LF (GAUZE/BANDAGES/DRESSINGS) IMPLANT
BNDG ESMARK 4X9 LF (GAUZE/BANDAGES/DRESSINGS) ×3 IMPLANT
BNDG GAUZE ELAST 4 BULKY (GAUZE/BANDAGES/DRESSINGS) ×6 IMPLANT
BRUSH SCRUB EZ PLAIN DRY (MISCELLANEOUS) ×3 IMPLANT
CLOSURE WOUND 1/2 X4 (GAUZE/BANDAGES/DRESSINGS) ×1
CORDS BIPOLAR (ELECTRODE) ×3 IMPLANT
COVER MAYO STAND STRL (DRAPES) ×3 IMPLANT
COVER TABLE BACK 60X90 (DRAPES) ×3 IMPLANT
CUFF TOURNIQUET SINGLE 18IN (TOURNIQUET CUFF) ×2 IMPLANT
DECANTER SPIKE VIAL GLASS SM (MISCELLANEOUS) IMPLANT
DRAPE EXTREMITY T 121X128X90 (DRAPE) ×3 IMPLANT
DRAPE SURG 17X23 STRL (DRAPES) ×3 IMPLANT
GAUZE SPONGE 4X4 12PLY STRL (GAUZE/BANDAGES/DRESSINGS) ×3 IMPLANT
GLOVE BIO SURGEON STRL SZ 6.5 (GLOVE) ×1 IMPLANT
GLOVE BIO SURGEONS STRL SZ 6.5 (GLOVE) ×1
GLOVE BIOGEL M STRL SZ7.5 (GLOVE) ×3 IMPLANT
GLOVE BIOGEL PI IND STRL 7.0 (GLOVE) IMPLANT
GLOVE BIOGEL PI INDICATOR 7.0 (GLOVE) ×2
GLOVE ORTHO TXT STRL SZ7.5 (GLOVE) ×3 IMPLANT
GOWN STRL REUS W/ TWL LRG LVL3 (GOWN DISPOSABLE) ×1 IMPLANT
GOWN STRL REUS W/ TWL XL LVL3 (GOWN DISPOSABLE) ×2 IMPLANT
GOWN STRL REUS W/TWL LRG LVL3 (GOWN DISPOSABLE) ×3
GOWN STRL REUS W/TWL XL LVL3 (GOWN DISPOSABLE) ×6
NEEDLE 27GAX1X1/2 (NEEDLE) IMPLANT
NS IRRIG 1000ML POUR BTL (IV SOLUTION) ×3 IMPLANT
PACK BASIN DAY SURGERY FS (CUSTOM PROCEDURE TRAY) ×3 IMPLANT
PAD CAST 3X4 CTTN HI CHSV (CAST SUPPLIES) IMPLANT
PADDING CAST ABS 4INX4YD NS (CAST SUPPLIES) ×2
PADDING CAST ABS COTTON 4X4 ST (CAST SUPPLIES) ×1 IMPLANT
PADDING CAST COTTON 3X4 STRL (CAST SUPPLIES)
PADDING UNDERCAST 2 STRL (CAST SUPPLIES)
PADDING UNDERCAST 2X4 STRL (CAST SUPPLIES) IMPLANT
PASSER SUT SWANSON 36MM LOOP (INSTRUMENTS) ×3 IMPLANT
SLEEVE SCD COMPRESS KNEE MED (MISCELLANEOUS) IMPLANT
SPLINT PLASTER CAST XFAST 3X15 (CAST SUPPLIES) IMPLANT
SPLINT PLASTER XTRA FASTSET 3X (CAST SUPPLIES) ×28
STOCKINETTE 4X48 STRL (DRAPES) ×3 IMPLANT
STRIP CLOSURE SKIN 1/2X4 (GAUZE/BANDAGES/DRESSINGS) ×2 IMPLANT
SUT ETHIBOND 3-0 V-5 (SUTURE) ×2 IMPLANT
SUT PROLENE 3 0 PS 2 (SUTURE) ×3 IMPLANT
SUT SILK 4 0 PS 2 (SUTURE) ×2 IMPLANT
SUT VIC AB 3-0 FS2 27 (SUTURE) IMPLANT
SUT VIC AB 4-0 P-3 18XBRD (SUTURE) ×1 IMPLANT
SUT VIC AB 4-0 P3 18 (SUTURE) ×3
SYR 3ML 23GX1 SAFETY (SYRINGE) IMPLANT
SYR BULB 3OZ (MISCELLANEOUS) ×3 IMPLANT
SYR CONTROL 10ML LL (SYRINGE) IMPLANT
TRAY DSU PREP LF (CUSTOM PROCEDURE TRAY) ×3 IMPLANT
UNDERPAD 30X30 INCONTINENT (UNDERPADS AND DIAPERS) ×3 IMPLANT

## 2014-03-08 NOTE — Discharge Instructions (Addendum)
[facility] Instructions Hand Surgery  Wound Care: Keep your hand elevated above the level of your heart.  Do not allow it to dangle by your side.  Keep the dressing dry and do not remove it unless your doctor advises you to do so.  He will usually change it at the time of your post-op visit.  Moving your fingers is advised to stimulate circulation but will depend on the site of your surgery.  If you have a splint applied, your doctor will advise you regarding movement.  Activity: Do not drive or operate machinery today.  Rest today and then you may return to your normal activity and work as indicated by your physician.  Diet:  Drink liquids today or eat a light diet.  You may resume a regular diet tomorrow.    General expectations: Pain for two to three days. Fingers may become slightly swollen.  Call your doctor if any of the following occur: Severe pain not relieved by pain medication. Elevated temperature. Dressing soaked with blood. Inability to move fingers. White or bluish color to fingers.  Elevate left hand above you heart level; for 4 days Finger motion is encouraged.    Post Anesthesia Home Care Instructions  Activity: Get plenty of rest for the remainder of the day. A responsible adult should stay with you for 24 hours following the procedure.  For the next 24 hours, DO NOT: -Drive a car -Paediatric nurse -Drink alcoholic beverages -Take any medication unless instructed by your physician -Make any legal decisions or sign important papers.  Meals: Start with liquid foods such as gelatin or soup. Progress to regular foods as tolerated. Avoid greasy, spicy, heavy foods. If nausea and/or vomiting occur, drink only clear liquids until the nausea and/or vomiting subsides. Call your physician if vomiting continues.  Special Instructions/Symptoms: Your throat may feel dry or sore from the anesthesia or the breathing tube placed in your throat during surgery. If this  causes discomfort, gargle with warm salt water. The discomfort should disappear within 24 hours.   Regional Anesthesia Blocks  1. Numbness or the inability to move the "blocked" extremity may last from 3-48 hours after placement. The length of time depends on the medication injected and your individual response to the medication. If the numbness is not going away after 48 hours, call your surgeon.  2. The extremity that is blocked will need to be protected until the numbness is gone and the  Strength has returned. Because you cannot feel it, you will need to take extra care to avoid injury. Because it may be weak, you may have difficulty moving it or using it. You may not know what position it is in without looking at it while the block is in effect.  3. For blocks in the legs and feet, returning to weight bearing and walking needs to be done carefully. You will need to wait until the numbness is entirely gone and the strength has returned. You should be able to move your leg and foot normally before you try and bear weight or walk. You will need someone to be with you when you first try to ensure you do not fall and possibly risk injury.  4. Bruising and tenderness at the needle site are common side effects and will resolve in a few days.  5. Persistent numbness or new problems with movement should be communicated to the surgeon or the [facility] [phone] [place] [phone]).   Call  your surgeon if you experience:   1.  Fever over 101.0. 2.  Inability to urinate. 3.  Nausea and/or vomiting. 4.  Extreme swelling or bruising at the surgical site. 5.  Continued bleeding from the incision. 6.  Increased pain, redness or drainage from the incision. 7.  Problems related to your pain medication.

## 2014-03-08 NOTE — Anesthesia Preprocedure Evaluation (Addendum)
Anesthesia Evaluation  Patient identified by MRN, date of birth, ID band Patient awake    Reviewed: Allergy & Precautions, H&P , NPO status , Patient's Chart, lab work & pertinent test results, reviewed documented beta blocker date and time   History of Anesthesia Complications (+) PONV and history of anesthetic complications  Airway Mallampati: II TM Distance: >3 FB Neck ROM: Full    Dental  (+) Dental Advisory Given   Pulmonary sleep apnea (mild, no CPAP) ,  breath sounds clear to auscultation  Pulmonary exam normal       Cardiovascular hypertension, Pt. on medications and Pt. on home beta blockers Rhythm:Regular Rate:Normal  '13 stress test: normal, EF 63% '13 ECHO: mild LVH with grade 1 diastolic dysfunction, EF 64-33%, valves Ok   Neuro/Psych Anxiety Depression negative neurological ROS     GI/Hepatic Neg liver ROS, GERD-  Medicated and Controlled,  Endo/Other  diabetes, Oral Hypoglycemic AgentsMorbid obesity  Renal/GU negative Renal ROS     Musculoskeletal   Abdominal (+) + obese,   Peds  Hematology   Anesthesia Other Findings   Reproductive/Obstetrics                        Anesthesia Physical Anesthesia Plan  ASA: III  Anesthesia Plan: General   Post-op Pain Management:    Induction: Intravenous  Airway Management Planned: LMA  Additional Equipment:   Intra-op Plan:   Post-operative Plan:   Informed Consent: I have reviewed the patients History and Physical, chart, labs and discussed the procedure including the risks, benefits and alternatives for the proposed anesthesia with the patient or authorized representative who has indicated his/her understanding and acceptance.   Dental advisory given  Plan Discussed with: Surgeon and CRNA  Anesthesia Plan Comments: (Plan routine monitors, GA- LMA OK, axillary block for post op analgesia)        Anesthesia Quick  Evaluation

## 2014-03-08 NOTE — Anesthesia Postprocedure Evaluation (Signed)
  Anesthesia Post-op Note  Patient: Grace Delacruz  Procedure(s) Performed: Procedure(s): LEFT THUMB CARPOMETACARPEL ([place]) SUSPENSION PLASTY WITH TRAPEZIUM EXCISION (Left)  Patient Location: PACU  Anesthesia Type:GA combined with regional for post-op pain  Level of Consciousness: awake, alert , oriented and patient cooperative  Airway and Oxygen Therapy: Patient Spontanous Breathing  Post-op Pain: none  Post-op Assessment: Post-op Vital signs reviewed, Patient's Cardiovascular Status Stable, Respiratory Function Stable, Patent Airway, No signs of Nausea or vomiting and Pain level controlled  Post-op Vital Signs: Reviewed and stable  Last Vitals:  Filed Vitals:   03/08/14 1230  BP: 127/59  Pulse: 75  Temp: 36.6 C  Resp: 18    Complications: No apparent anesthesia complications

## 2014-03-08 NOTE — Anesthesia Procedure Notes (Addendum)
Anesthesia Regional Block:  Axillary brachial plexus block  Pre-Anesthetic Checklist: ,, timeout performed, Correct Patient, Correct Site, Correct Laterality, Correct Procedure, Correct Position, site marked, Risks and benefits discussed,  Surgical consent,  Pre-op evaluation,  At surgeon's request and post-op pain management  Laterality: Left and Upper  Prep: chloraprep       Needles:  Injection technique: Single-shot  Needle Type: Other   (#22 ga blunt "B" bevel needle)        Additional Needles:  Procedures: paresthesia technique Axillary brachial plexus block  Nerve Stimulator or Paresthesia:  Response: transient radial nerve paresthesia,  Response: transient ulnar nerve paresthesia,   Additional Responses:   Narrative:  Start time: 03/08/2014 8:13 AM End time: 03/08/2014 8:17 AM Injection made incrementally with aspirations every 5 mL.  Events: blood aspirated  Performed by: Personally  Anesthesiologist: Jenita Seashore, MD  Additional Notes: Pt identified in Holding room.  Monitors applied. Working IV access confirmed. Sterile prep L axilla.  #22ga blunt "B" bevel to transient median nerve paresthesia, then transient ulnar nerve paresthesia.  25cc 1.5% lidocaine with 1:200k epi and 30cc 0.5% Bupivacaine with 1:200k epi injected incrementally after negative test doses, distributed around each pareshtesia.  Patient asymptomatic, VSS, (heme aspirated initially, needle withdrawn, cleared, repositioned), tolerated well.  Jenita Seashore, MD   Performed by: Golden Hurter C    Procedure Name: LMA Insertion Date/Time: 03/08/2014 9:05 AM Performed by: Maryella Shivers Pre-anesthesia Checklist: Patient identified, Emergency Drugs available, Suction available and Patient being monitored Patient Re-evaluated:Patient Re-evaluated prior to inductionOxygen Delivery Method: Circle System Utilized Preoxygenation: Pre-oxygenation with 100% oxygen Intubation Type: IV induction Ventilation:  Mask ventilation without difficulty LMA: LMA inserted LMA Size: 4.0 Number of attempts: 1 Airway Equipment and Method: bite block Placement Confirmation: positive ETCO2 Tube secured with: Tape Dental Injury: Teeth and Oropharynx as per pre-operative assessment

## 2014-03-08 NOTE — Brief Op Note (Signed)
03/08/2014  10:28 AM  PATIENT:  Grace Delacruz  68 y.o. female  PRE-OPERATIVE DIAGNOSIS:  Left thumb carpometacarpal end stage osteoarthritis  POST-OPERATIVE DIAGNOSIS:  Left thumb carpometacarpal end stage osteoarthritis  PROCEDURE:  LEFT TRAPEZIUM EXCISION WITH FCR/APL KNOTTED TENDON INTERPOSITION ARTHROPLASTY, APL TENDON TRANSFER TO THENAR MUSCLES  SURGEON:  Surgeon(s) and Role:    * [name]., MD - Primary  PHYSICIAN ASSISTANT:   ASSISTANTS: Kathyrn Sheriff.A-C    ANESTHESIA:   general  EBL:  Total I/O In: 1000 [I.V.:1000] Out: -   BLOOD ADMINISTERED:none  DRAINS: none   LOCAL MEDICATIONS USED:  Bupivacaine and lidocaine  SPECIMEN:  No Specimen  DISPOSITION OF SPECIMEN:  N/A  COUNTS:  YES  TOURNIQUET:  * Missing tourniquet times found for documented tourniquets in log:  [id number] *  DICTATION: .Other Dictation: Dictation Number [id number]  PLAN OF CARE: Discharge to home after PACU  PATIENT DISPOSITION:  PACU - hemodynamically stable.   Delay start of Pharmacological VTE agent (>24hrs) due to surgical blood loss or risk of bleeding: not applicable

## 2014-03-08 NOTE — Progress Notes (Signed)
Assisted Dr. Glennon Mac with left, axillary block. Side rails up, monitors on throughout procedure. See vital signs in flow sheet. Tolerated Procedure well.

## 2014-03-08 NOTE — Op Note (Signed)
[id number] 

## 2014-03-08 NOTE — Transfer of Care (Signed)
Immediate Anesthesia Transfer of Care Note  Patient: Grace Delacruz  Procedure(s) Performed: Procedure(s): LEFT THUMB CARPOMETACARPEL ([place]) SUSPENSION PLASTY WITH TRAPEZIUM EXCISION (Left)  Patient Location: PACU  Anesthesia Type:GA combined with regional for post-op pain  Level of Consciousness: awake, alert  and oriented  Airway & Oxygen Therapy: Patient Spontanous Breathing and Patient connected to face mask oxygen  Post-op Assessment: Report given to PACU RN and Post -op Vital signs reviewed and stable  Post vital signs: Reviewed and stable  Complications: No apparent anesthesia complications

## 2014-03-09 NOTE — Op Note (Signed)
Grace Delacruz, Grace Delacruz               ACCOUNT NO.:  [id number]  MEDICAL RECORD NO.:  [id number]  LOCATION:                                 FACILITY:  PHYSICIAN:  Youlanda Mighty. Dylanie Quesenberry, M.D. DATE OF BIRTH:  Jun 14, 1946  DATE OF PROCEDURE:  03/08/2014 DATE OF DISCHARGE:                              OPERATIVE REPORT   PREOPERATIVE DIAGNOSES:  End-stage pantrapezial arthritis, left thumb with thumb in palm adduction posture, and hyperextension of thumb MP joint.  POSTOPERATIVE DIAGNOSES:  End-stage pantrapezial arthritis, left thumb with thumb in palm adduction posture, and hyperextension of thumb MP joint.  OPERATION: 1. Resection of left trapezium with synovectomy and removal of loose     bodies from carpometacarpal joint. 2. Knotted tendon interposition arthroplasty with knotting of abductor     pollicis longus, dorsal slip and one-half of distally-based flexor     carpi radialis in the manner of Garcia-Elias. 3. Transfer of abductor pollicis longus distally to the thenar muscle     fascia to increase abduction moment arm of CMC joint.  OPERATING SURGEON:  Youlanda Mighty. Kayce Betty, MD.  ASSISTANT:  Marily Lente. Dasnoit, PA-C.  ANESTHESIA:  General by LMA supplemented by bupivacaine and lidocaine plexus block placed preoperatively by Dr. Glennon Mac.  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 Rory Percy for evaluation and management of a painful left thumb CMC joint.  Grace Delacruz had end-stage CMC pantrapezial arthrosis, and a thumb and palm adduction posture with hyperextension of MP joint.  She had pain in both the [facility] and MP joints.  Plain films demonstrated [place] stage IV CMC arthrosis and a flat morphology of the metacarpal head.  She did not tolerate MP hyperextension well due to the morphology of Grace MP joint.  She did, however, have adequate hyaline cartilage at the MP joint.  I advised Grace Delacruz to consider proceeding with a  CMC arthroplasty with augmentation of Grace palmar and radial abduction of the thumb metacarpal, which ought to rebalance Grace MP joint.  She had detailed informed consent at the office x2.  At Grace initial preoperative visit, we identified that she had residual issues of hypertension and other medical predicaments which we advised Grace to speak with Grace primary care physician, Dr. Nadara Mustard, and Grace cardiologist about to resolve prior to surgery.  She was stabilized with respect to blood pressure and had some medication changes.  She then returned for definitive surgery at this time.  Preoperatively, she was interviewed by Dr. Glennon Mac who noted a past history of hypertension, a left bundle-branch block that was intermittent, and a history of mild sleep apnea.  Dr. Glennon Mac recommended a perioperative plexus block.  This was placed in the holding area under ultrasound guidance using bupivacaine and lidocaine without complication.  Grace Delacruz had emerging anesthesia of left upper extremity, as we transferred Grace to the operating room, proper.  The left arm was marked as the proper surgical site per protocol with a marking pen in the holding area.  Preoperatively, she was reminded of the potential risks and benefits of surgery.  Questions were invited and answered in detail.  PROCEDURE IN DETAIL:  Grace Delacruz was brought to room 2 of the [facility] and placed in supine position on the operating table.  Under Dr. Marisue Brooklyn supervision, general anesthesia by LMA technique was induced.  1 g of vancomycin was administered as an IV prophylactic antibiotic followed by routine Betadine scrub and paint of left upper extremity.  The left arm was exsanguinated with Esmarch bandage, an arterial tourniquet on the proximal brachium inflated to 220 mmHg.  A routine surgical time-out was accomplished followed by proceeding to plan a Wagner incision extended with a Brunner proximal limb  exposing the thenar musculature and the abductor pollicis longus/extensor pollicis brevis tendons.  Great care was taken to properly identify the [facility] joint.  The thenar muscles were elevated, and the capsule of the [facility] joint confirmed.  We gently protected the lateral antebrachial cutaneous nerve branches as well as the superficial radial sensory branches.  There was noted to be one large dorsal slip of the abductor pollicis longus and a diminutive palmar slip measuring less than 2 mm.  There was a robust flexor carpi radialis.  The trapezium was identified and exposed subperiosteally taking care to protect the radial artery.  Self- retaining retractors were placed followed by piecemeal removal of the entire trapezium.  There were multiple loose bodies and extensive synovitis between the first and second metacarpals.  The synovitis was removed with a rongeur followed by hemostasis with bipolar cautery.  We then identified the flexor carpi radialis at the distal wrist, taking care to preserve the superficial branch of the radial artery.  We identified the flexor carpi radialis musculotendinous junction and harvested the radial one-half of the tendon at the musculotendinous junction.  The tendon was brought distally with a Carroll tendon passing forceps and subsequently passed into the cavity created by resection of the trapezium.  The flexor carpi radialis was split to its insertion at the base of the second metacarpal.  We then split the abductor pollicis longus and released the first dorsal compartment completely.  There was a very small slip of the extensor pollicis brevis noted.  We then took the dorsal half of the abductor pollicis longus primary slip with the flexor carpi radialis tail and performed a Grace Delacruz knotted tendon interposition arthroplasty.  Excellent bulk was created with a knot stack with a pair of Windsor knots.  Flexor carpi radialis was not long enough to  complete the third knot.  With each pass of the tendon, we secured the knots with interrupted sutures of 3-0 Ethibond.  After this was completed, we then repaired the thenar musculature to the fascia overlying the scaphoid distally.  This secured the knotted interposition arthroplasty, deep to the thenar musculature and capsule of the CMC joint.  We then very carefully released the lax abductor pollicis longus tendon slip as well as diminished palmar slip.  We wove these into the thenar muscle fascia approximately 1 cm distal to its prior insertion increasing the palmar and radial abduction moment arc of the abductor pollicis longus.  This was repaired with a 2-pass Pulvertaft weave with multiple interrupted sutures of 3-0 Ethibond.  This left the thumb in a position of 30 degrees of palmar abduction and corrected the hyperextension of the thumb MP joint.  We then proceeded to obtain hemostasis.  The wound was repaired with subcutaneous suture of 4-0 Vicryl and intradermal 3-0 Prolene as was the forearm wound with subcutaneous 4-0 Vicryl and intradermal 3-0 Prolene. Steri-Strips were applied followed by compressive dressing.  The  tourniquet was released with immediate capillary refill to the fingers and thumb.  Grace Delacruz was placed in a voluminous hand dressing, dorsal plaster thumb spica splint, protecting the construct.  There were no apparent complications.  For aftercare, Grace Delacruz is provided prescriptions for doxycycline 100 mg p.o. b.i.d. x4 days as well as Percocet 5/325, 1 or 2 tablets p.o. q.4 to 6 hours p.r.n. pain, #30 tablets without refill.  I will see Grace back in followup in the office in a week for dressing change and advancement to a thumb spica cast.  Questions were invited and answered in detail.  A conference will be held with Grace Delacruz postoperatively.     Youlanda Mighty Jacksyn Beeks, M.D.     RVS/MEDQ  D:  03/08/2014  T:  03/09/2014  Job:  [id number]  cc:    Rory Percy, MD

## 2014-03-09 NOTE — Addendum Note (Signed)
Addendum created 03/09/14 7209 by Tawni Millers, CRNA   Modules edited: Charges VN

## 2014-03-10 ENCOUNTER — Encounter (HOSPITAL_BASED_OUTPATIENT_CLINIC_OR_DEPARTMENT_OTHER): Payer: Self-pay | Admitting: Orthopedic Surgery

## 2014-03-15 DIAGNOSIS — M12139 Kaschin-Beck disease, unspecified wrist: Secondary | ICD-10-CM | POA: Diagnosis not present

## 2014-03-16 DIAGNOSIS — M12139 Kaschin-Beck disease, unspecified wrist: Secondary | ICD-10-CM | POA: Diagnosis not present

## 2014-03-23 DIAGNOSIS — E119 Type 2 diabetes mellitus without complications: Secondary | ICD-10-CM | POA: Diagnosis not present

## 2014-03-23 DIAGNOSIS — I10 Essential (primary) hypertension: Secondary | ICD-10-CM | POA: Diagnosis not present

## 2014-03-23 DIAGNOSIS — E039 Hypothyroidism, unspecified: Secondary | ICD-10-CM | POA: Diagnosis not present

## 2014-03-23 DIAGNOSIS — R002 Palpitations: Secondary | ICD-10-CM | POA: Diagnosis not present

## 2014-03-23 DIAGNOSIS — E7800 Pure hypercholesterolemia, unspecified: Secondary | ICD-10-CM | POA: Diagnosis not present

## 2014-03-30 DIAGNOSIS — E7800 Pure hypercholesterolemia, unspecified: Secondary | ICD-10-CM | POA: Diagnosis not present

## 2014-03-30 DIAGNOSIS — E119 Type 2 diabetes mellitus without complications: Secondary | ICD-10-CM | POA: Diagnosis not present

## 2014-03-30 DIAGNOSIS — Z8679 Personal history of other diseases of the circulatory system: Secondary | ICD-10-CM | POA: Diagnosis not present

## 2014-03-30 DIAGNOSIS — I10 Essential (primary) hypertension: Secondary | ICD-10-CM | POA: Diagnosis not present

## 2014-03-30 DIAGNOSIS — E039 Hypothyroidism, unspecified: Secondary | ICD-10-CM | POA: Diagnosis not present

## 2014-04-25 DIAGNOSIS — M12139 Kaschin-Beck disease, unspecified wrist: Secondary | ICD-10-CM | POA: Diagnosis not present

## 2014-05-02 DIAGNOSIS — M12139 Kaschin-Beck disease, unspecified wrist: Secondary | ICD-10-CM | POA: Diagnosis not present

## 2014-05-09 DIAGNOSIS — M12139 Kaschin-Beck disease, unspecified wrist: Secondary | ICD-10-CM | POA: Diagnosis not present

## 2014-05-10 ENCOUNTER — Telehealth: Payer: Self-pay

## 2014-05-10 NOTE — Telephone Encounter (Signed)
Pt called for JL. JL was on another line and I told patient that I could take a message. Pt said that they were playing phone tag and said to just have JL call her back. [phone]

## 2014-05-11 NOTE — Telephone Encounter (Signed)
I spoke with Grace Delacruz

## 2014-05-11 NOTE — Telephone Encounter (Signed)
The previous note was a phone call concerning her mother-in-law, not this pt.

## 2014-07-11 ENCOUNTER — Encounter: Payer: Self-pay | Admitting: Cardiology

## 2014-07-11 NOTE — Progress Notes (Signed)
No show  This encounter was created in error - please disregard.

## 2014-07-12 ENCOUNTER — Encounter: Payer: Medicare Other | Admitting: Cardiology

## 2014-07-19 DIAGNOSIS — N200 Calculus of kidney: Secondary | ICD-10-CM | POA: Diagnosis not present

## 2014-07-22 DIAGNOSIS — K5730 Diverticulosis of large intestine without perforation or abscess without bleeding: Secondary | ICD-10-CM | POA: Diagnosis not present

## 2014-07-22 DIAGNOSIS — R109 Unspecified abdominal pain: Secondary | ICD-10-CM | POA: Diagnosis not present

## 2014-08-01 DIAGNOSIS — Z23 Encounter for immunization: Secondary | ICD-10-CM | POA: Diagnosis not present

## 2014-08-05 ENCOUNTER — Encounter: Payer: Self-pay | Admitting: Cardiology

## 2014-08-05 ENCOUNTER — Ambulatory Visit (INDEPENDENT_AMBULATORY_CARE_PROVIDER_SITE_OTHER): Payer: Medicare Other | Admitting: Cardiology

## 2014-08-05 VITALS — BP 121/76 | HR 80 | Ht 64.0 in | Wt 262.5 lb

## 2014-08-05 DIAGNOSIS — I10 Essential (primary) hypertension: Secondary | ICD-10-CM | POA: Diagnosis not present

## 2014-08-05 DIAGNOSIS — I447 Left bundle-branch block, unspecified: Secondary | ICD-10-CM | POA: Diagnosis not present

## 2014-08-05 NOTE — Assessment & Plan Note (Signed)
Intermittently noted, no obvious ischemic precipitant with prior evaluation revealing normal LVEF and no large ischemic burden. She is asymptomatic. Continue observation for now.

## 2014-08-05 NOTE — Assessment & Plan Note (Signed)
Blood pressure well controlled on current regimen, no changes made

## 2014-08-05 NOTE — Patient Instructions (Signed)
Continue all current medications. Your physician wants you to follow up in: 6 months.  You will receive a reminder letter in the mail one-two months in advance.  If you don't receive a letter, please call our office to schedule the follow up appointment   

## 2014-08-05 NOTE — Progress Notes (Signed)
Clinical Summary Ms. Lohr is a 68 y.o.female last seen in March. She has been doing fairly well, reports no palpitations or syncope. Taking care of her 61 year old mother-in-law and her 61 year old mother.  She reports her blood pressure has been well-controlled on current regimen. She'll follow with Dr. Nadara Mustard as well.  Echocardiogram done in August 2013 indicated moderate LVH with LVEF of 80-99%, grade 1 diastolic dysfunction, paradoxical septal motion, mild left atrial enlargement. No major abnormalities. Lexiscan Myoview done in 2013 reports no significant ST segment changes and overall normal perfusion, no clear evidence of ischemia.   We have been following her with history of intermittent left bundle branch block, no obvious ischemic precipitant.  Allergies  Allergen Reactions  . Ephedrine     Heart rate fast-  . Hibiclens [Chlorhexidine Gluconate]     SKIN REDNESS AND BURNING SENSATION  . Keflex [Cephalexin] Itching and Rash    Current Outpatient Prescriptions  Medication Sig Dispense Refill  . amLODipine (NORVASC) 10 MG tablet Take 10 mg by mouth every morning.       Marland Kitchen aspirin 81 MG tablet Take 81 mg by mouth at bedtime.       Marland Kitchen FLUoxetine (PROZAC) 20 MG capsule Take 20 mg by mouth every morning.       . furosemide (LASIX) 40 MG tablet Take 40 mg by mouth daily.      Marland Kitchen ibuprofen (ADVIL,MOTRIN) 200 MG tablet Take 400 mg by mouth at bedtime.       Marland Kitchen levothyroxine (SYNTHROID, LEVOTHROID) 112 MCG tablet Take 112 mcg by mouth every morning. IN AM      . losartan (COZAAR) 100 MG tablet Take 1 tablet (100 mg total) by mouth every evening.      . meclizine (ANTIVERT) 25 MG tablet Take 25 mg by mouth 3 (three) times daily as needed for dizziness.       . metFORMIN (GLUCOPHAGE) 500 MG tablet Take 500 mg by mouth daily with breakfast.       . metoprolol succinate (TOPROL-XL) 100 MG 24 hr tablet Take 100 mg by mouth every morning. Take with or immediately following a meal.      .  simvastatin (ZOCOR) 40 MG tablet Take 40 mg by mouth at bedtime.      Marland Kitchen spironolactone (ALDACTONE) 25 MG tablet Take 25 mg by mouth daily.       No current facility-administered medications for this visit.    Past Medical History  Diagnosis Date  . Osteoarthritis   . Essential hypertension, benign   . Type 2 diabetes mellitus   . Hypothyroidism   . Depression   . Anxiety   . Fibrocystic breast disease   . Scoliosis   . DJD (degenerative joint disease)   . Menopause   . Esophageal reflux   . Sleep apnea     Reportedly mild - no CPAP  . Left bundle branch block     Transient - possibly rate related     Social History Ms. Slawinski reports that she has never smoked. She has never used smokeless tobacco. Ms. Samarin reports that she does not drink alcohol.  Review of Systems Arthritic pains. Exercising regularly. No syncope. Other systems reviewed and negative except as outlined.  Physical Examination Filed Vitals:   08/05/14 1616  BP: 121/76  Pulse: 80   Filed Weights   08/05/14 1616  Weight: 262 lb 8 oz (119.069 kg)    Overweight woman in no acute distress.  HEENT:  Conjunctiva and lids normal, oropharynx clear.  Neck: Supple, no elevated JVP or carotid bruits, no thyromegaly.  Lungs: Clear to auscultation, nonlabored breathing at rest.  Cardiac: Regular rate and rhythm, no S3 or significant systolic murmur, no pericardial rub.  Abdomen: Soft, nontender, bowel sounds present, no guarding or rebound.  Extremities: No pitting edema, distal pulses 2+.    Problem List and Plan   Left bundle branch block Intermittently noted, no obvious ischemic precipitant with prior evaluation revealing normal LVEF and no large ischemic burden. She is asymptomatic. Continue observation for now.  Essential hypertension, benign Blood pressure well controlled on current regimen, no changes made    Satira Sark, M.D., F.A.C.C.

## 2014-08-10 DIAGNOSIS — M545 Low back pain: Secondary | ICD-10-CM | POA: Diagnosis not present

## 2014-08-10 DIAGNOSIS — M4120 Other idiopathic scoliosis, site unspecified: Secondary | ICD-10-CM | POA: Diagnosis not present

## 2014-08-10 DIAGNOSIS — M5136 Other intervertebral disc degeneration, lumbar region: Secondary | ICD-10-CM | POA: Diagnosis not present

## 2014-08-10 DIAGNOSIS — M4185 Other forms of scoliosis, thoracolumbar region: Secondary | ICD-10-CM | POA: Diagnosis not present

## 2014-08-19 DIAGNOSIS — R918 Other nonspecific abnormal finding of lung field: Secondary | ICD-10-CM | POA: Diagnosis not present

## 2014-10-03 DIAGNOSIS — E119 Type 2 diabetes mellitus without complications: Secondary | ICD-10-CM | POA: Diagnosis not present

## 2014-10-03 DIAGNOSIS — I10 Essential (primary) hypertension: Secondary | ICD-10-CM | POA: Diagnosis not present

## 2014-10-03 DIAGNOSIS — E039 Hypothyroidism, unspecified: Secondary | ICD-10-CM | POA: Diagnosis not present

## 2014-10-03 DIAGNOSIS — E780 Pure hypercholesterolemia: Secondary | ICD-10-CM | POA: Diagnosis not present

## 2014-10-10 DIAGNOSIS — Z1389 Encounter for screening for other disorder: Secondary | ICD-10-CM | POA: Diagnosis not present

## 2014-10-10 DIAGNOSIS — Z0001 Encounter for general adult medical examination with abnormal findings: Secondary | ICD-10-CM | POA: Diagnosis not present

## 2014-10-10 DIAGNOSIS — F328 Other depressive episodes: Secondary | ICD-10-CM | POA: Diagnosis not present

## 2014-10-10 DIAGNOSIS — E780 Pure hypercholesterolemia: Secondary | ICD-10-CM | POA: Diagnosis not present

## 2014-10-10 DIAGNOSIS — Z23 Encounter for immunization: Secondary | ICD-10-CM | POA: Diagnosis not present

## 2014-10-10 DIAGNOSIS — E039 Hypothyroidism, unspecified: Secondary | ICD-10-CM | POA: Diagnosis not present

## 2014-10-10 DIAGNOSIS — E119 Type 2 diabetes mellitus without complications: Secondary | ICD-10-CM | POA: Diagnosis not present

## 2014-10-13 ENCOUNTER — Encounter: Payer: Self-pay | Admitting: Pulmonary Disease

## 2014-10-13 ENCOUNTER — Ambulatory Visit (INDEPENDENT_AMBULATORY_CARE_PROVIDER_SITE_OTHER)
Admission: RE | Admit: 2014-10-13 | Discharge: 2014-10-13 | Disposition: A | Discharge status: Home / Self Care | Payer: Medicare Other | Source: Ambulatory Visit | Attending: Pulmonary Disease | Admitting: Pulmonary Disease

## 2014-10-13 ENCOUNTER — Ambulatory Visit (INDEPENDENT_AMBULATORY_CARE_PROVIDER_SITE_OTHER): Payer: Medicare Other | Admitting: Pulmonary Disease

## 2014-10-13 ENCOUNTER — Other Ambulatory Visit: Payer: Self-pay | Admitting: Pulmonary Disease

## 2014-10-13 ENCOUNTER — Encounter (INDEPENDENT_AMBULATORY_CARE_PROVIDER_SITE_OTHER): Payer: Self-pay

## 2014-10-13 VITALS — BP 112/74 | HR 66 | Temp 97.0°F | Ht 65.0 in | Wt 268.2 lb

## 2014-10-13 DIAGNOSIS — R938 Abnormal findings on diagnostic imaging of other specified body structures: Secondary | ICD-10-CM

## 2014-10-13 DIAGNOSIS — R9389 Abnormal findings on diagnostic imaging of other specified body structures: Secondary | ICD-10-CM

## 2014-10-13 DIAGNOSIS — R05 Cough: Secondary | ICD-10-CM | POA: Diagnosis not present

## 2014-10-13 NOTE — Assessment & Plan Note (Signed)
The patient has a history of an abnormal right lower lobe opacity noted on a chest x-ray while at [facility] for spine evaluation.   Her follow-up chest x-ray there showed a decrease in prominence approximately 10 days later. Unfortunately, the x-rays are not available for my review. The patient has never smoked, has no personal history of cancer, and really has no significant pulmonary symptoms. At this point, I think she needs to have a follow-up chest x-ray, and if unremarkable, no further follow-up is needed. If her x-ray is abnormal, I would proceed with a CT chest for further evaluation. The patient is agreeable to this approach.

## 2014-10-13 NOTE — Patient Instructions (Signed)
Will check a chest xray today, and will call you with results. If cxr normal, no further follouwp.  If abnormal, will check a scan of your chest for further evaluation.

## 2014-10-13 NOTE — Progress Notes (Signed)
   Subjective:    Patient ID: Grace Delacruz, female    DOB: 12-19-1945, 68 y.o.   MRN: [id number]  HPI  the patient is a 68 year old female who I've been asked to see for an abnormal chest x-ray. She was recently evaluated at [facility] for a spine issue, in a chest x-ray there showed an abnormal right lower lobe opacity. She had a follow-up film 10 days later with a decrease in size of the abnormality. She has not had a follow-up since that time, and is here today for further evaluation. The patient has never smoked, and has no personal history of cancer. Her health maintenance is up-to-date, and she denies any breathing issues at this time. She does complain of a mild cough that occasionally occurs at night , in does say that she has had 5 episodes of low-grade fever over the last 3 months, the last being approximately 4 weeks ago. Unfortunately, I have no access to her prior films.   Review of Systems  Constitutional: Negative for fever and unexpected weight change.  HENT: Positive for sneezing. Negative for congestion, dental problem, ear pain, nosebleeds, postnasal drip, rhinorrhea, sinus pressure, sore throat and trouble swallowing.   Eyes: Negative for redness and itching.  Respiratory: Positive for cough and shortness of breath. Negative for chest tightness and wheezing.   Cardiovascular: Negative for palpitations and leg swelling.  Gastrointestinal: Negative for nausea and vomiting.  Genitourinary: Negative for dysuria.  Musculoskeletal: Positive for arthralgias. Negative for joint swelling.  Skin: Negative for rash.  Neurological: Negative for headaches.  Hematological: Does not bruise/bleed easily.  Psychiatric/Behavioral: Negative for dysphoric mood. The patient is not nervous/anxious.        Objective:   Physical Exam Constitutional:  Obese female, no acute distress  HENT:  Nares patent without discharge  Oropharynx without exudate, palate and uvula are  normal  Eyes:  Perrla, eomi, no scleral icterus  Neck:  No JVD, no TMG  Cardiovascular:  Normal rate, regular rhythm, no rubs or gallops.  No murmurs        Intact distal pulses  Pulmonary :  Normal breath sounds, no stridor or respiratory distress   No rales, rhonchi, or wheezing  Abdominal:  Soft, nondistended, bowel sounds present.  No tenderness noted.   Musculoskeletal:  mild lower extremity edema noted.  Lymph Nodes:  No cervical lymphadenopathy noted  Skin:  No cyanosis noted  Neurologic:  Alert, appropriate, moves all 4 extremities without obvious deficit.         Assessment & Plan:

## 2014-10-17 ENCOUNTER — Ambulatory Visit (INDEPENDENT_AMBULATORY_CARE_PROVIDER_SITE_OTHER)
Admission: RE | Admit: 2014-10-17 | Discharge: 2014-10-17 | Disposition: A | Discharge status: Home / Self Care | Payer: Medicare Other | Source: Ambulatory Visit | Attending: Pulmonary Disease | Admitting: Pulmonary Disease

## 2014-10-17 DIAGNOSIS — R918 Other nonspecific abnormal finding of lung field: Secondary | ICD-10-CM | POA: Diagnosis not present

## 2014-10-17 DIAGNOSIS — R9389 Abnormal findings on diagnostic imaging of other specified body structures: Secondary | ICD-10-CM

## 2014-10-17 DIAGNOSIS — R938 Abnormal findings on diagnostic imaging of other specified body structures: Secondary | ICD-10-CM

## 2014-10-19 ENCOUNTER — Telehealth: Payer: Self-pay | Admitting: Pulmonary Disease

## 2014-10-19 NOTE — Telephone Encounter (Signed)
Called and spoke to pt. Pt requesting results from CT scan from 10/17/14. Advised pt that [facility] will not be back in office till next week. Pt verbalized understanding.   [place] please advise on CT scan.

## 2014-10-25 NOTE — Telephone Encounter (Signed)
Has been addressed.  See under ct report.

## 2014-10-26 NOTE — Telephone Encounter (Signed)
Result Notes     Notes Recorded by Inge Rise, CMA on 10/25/2014 at 9:09 AM I spoke with patient about results and she verbalized understanding and had no questions ------  Notes Recorded by Kathee Delton, MD on 10/24/2014 at 6:28 PM Please let pt know that her chest ct is totally normal. Nothing in lung or in lining of the lung. Totally clear. Great news.   Will sign off

## 2015-02-06 ENCOUNTER — Encounter: Payer: Self-pay | Admitting: Cardiology

## 2015-02-06 ENCOUNTER — Ambulatory Visit (INDEPENDENT_AMBULATORY_CARE_PROVIDER_SITE_OTHER): Payer: Medicare Other | Admitting: Cardiology

## 2015-02-06 VITALS — BP 90/58 | HR 64 | Ht 65.0 in | Wt 267.0 lb

## 2015-02-06 DIAGNOSIS — I10 Essential (primary) hypertension: Secondary | ICD-10-CM

## 2015-02-06 DIAGNOSIS — I447 Left bundle-branch block, unspecified: Secondary | ICD-10-CM | POA: Diagnosis not present

## 2015-02-06 NOTE — Progress Notes (Signed)
Cardiology Office Note  Date: 02/06/2015   ID: Grace Delacruz, DOB 25-Sep-1946, MRN [id number]  PCP: Grace Percy, MD  Primary Cardiologist: Grace Lesches, MD   Chief Complaint  Patient presents with  . Left bundle branch block  . Hypertension    History of Present Illness: Grace Delacruz is a 69 y.o. female last seen in October 2015. She presents for a routine visit. She does not report any angina symptoms, no dizziness, palpitations, or syncope. 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  We reviewed her medications, antihypertensive regimen has been stable. Blood pressure noted to be relatively low today, although she was asymptomatic. She does state that her systolic has been around 892 times.  Follow-up tracing today is reviewed below.   Past Medical History  Diagnosis Date  . Osteoarthritis   . Essential hypertension, benign   . Type 2 diabetes mellitus   . Hypothyroidism   . Depression   . Anxiety   . Fibrocystic breast disease   . Scoliosis   . DJD (degenerative joint disease)   . Menopause   . Esophageal reflux   . Sleep apnea     Reportedly mild - no CPAP  . Left bundle branch block     Transient - possibly rate related     Current Outpatient Prescriptions  Medication Sig Dispense Refill  . amLODipine (NORVASC) 10 MG tablet Take 10 mg by mouth every morning.     Marland Kitchen aspirin 81 MG tablet Take 81 mg by mouth at bedtime.     Marland Kitchen FLUoxetine (PROZAC) 20 MG capsule Take 20 mg by mouth every morning.     . furosemide (LASIX) 40 MG tablet Take 40 mg by mouth daily.    Marland Kitchen ibuprofen (ADVIL,MOTRIN) 200 MG tablet Take 400 mg by mouth at bedtime.     Marland Kitchen levothyroxine (SYNTHROID, LEVOTHROID) 112 MCG tablet Take 112 mcg by mouth every morning. IN AM    . losartan (COZAAR) 100 MG tablet Take 1 tablet (100 mg total) by mouth every evening.    . meclizine (ANTIVERT) 25 MG tablet Take 25 mg by mouth 3 (three)  times daily as needed for dizziness.     . metFORMIN (GLUCOPHAGE) 500 MG tablet Take 500 mg by mouth daily with breakfast.     . metoprolol succinate (TOPROL-XL) 100 MG 24 hr tablet Take 100 mg by mouth every morning. Take with or immediately following a meal.    . simvastatin (ZOCOR) 40 MG tablet Take 40 mg by mouth at bedtime.    Marland Kitchen spironolactone (ALDACTONE) 25 MG tablet Take 25 mg by mouth daily.     No current facility-administered medications for this visit.    Allergies:  Ephedrine; Hibiclens; and Keflex   Social History: The patient  reports that she has never smoked. She has never used smokeless tobacco. She reports that she drinks alcohol. She reports that she does not use illicit drugs.    ROS:  Please see the history of present illness. Otherwise, complete review of systems is positive for arthritic pains.  All other systems are reviewed and negative.   Physical Exam: VS:  BP 90/58 mmHg  Pulse 64  Ht 5\' 5"  (1.651 m)  Wt 267 lb (121.11 kg)  BMI 44.43 kg/m2  SpO2 97%, BMI Body mass index is 44.43 kg/(m^2).  Wt Readings from Last 3 Encounters:  02/06/15 267 lb (121.11 kg)  10/13/14 268 lb  3.2 oz (121.655 kg)  08/05/14 262 lb 8 oz (119.069 kg)     General: Patient appears comfortable at rest. HEENT: Conjunctiva and lids normal, oropharynx clear with moist mucosa. Neck: Supple, no elevated JVP or carotid bruits, no thyromegaly. Lungs: Clear to auscultation, nonlabored breathing at rest. Cardiac: Regular rate and rhythm, no S3 or significant systolic murmur, no pericardial rub. Abdomen: Soft, nontender, no hepatomegaly, bowel sounds present, no guarding or rebound. Extremities: No pitting edema, distal pulses 2+. Skin: Warm and dry. Musculoskeletal: No kyphosis. Neuropsychiatric: Alert and oriented x3, affect grossly appropriate.   ECG: ECG is ordered today and reviewed showing sinus rhythm with borderline low voltage and nonspecific T-wave changes.   Recent  Labwork: 03/03/2014: BUN 25*; Creatinine 1.01; Potassium 4.9; Sodium 138 03/08/2014: Hemoglobin 14.9   Other Studies Reviewed Today:  1. Echocardiogram done in August 2013 indicated moderate LVH with LVEF of 33-29%, grade 1 diastolic dysfunction, paradoxical septal motion, mild left atrial enlargement. No major abnormalities.   2. Lexiscan Myoview done in 2013 reports no significant ST segment changes and overall normal perfusion, no clear evidence of ischemia.   Assessment and Plan:  1. Symptomatically stable, history of intermittent left bundle-branch block, no obvious evidence of ischemic heart disease or cardiomyopathy on interval testing. Follow-up ECG is reviewed and stable. Continue observation.  2. History of essential hypertension, blood pressure is actually low today, but she is asymptomatic. I have asked her to keep an eye on this, may actually need down titration of antihypertensives. She will continue to follow with Dr. Nadara Mustard.  Current medicines were reviewed with the patient today. No changes were made.   Orders Placed This Encounter  Procedures  . EKG 12-Lead    Disposition: FU with me in 6 months.   Signed, Satira Sark, MD, [facility] 02/06/2015 9:59 AM    [facility] [address], [place], [place] [address] Phone: [phone]; Fax: [phone]

## 2015-02-06 NOTE — Patient Instructions (Signed)
Your physician wants you to follow-up in: 6 months with Dr. McDowell You will receive a reminder letter in the mail two months in advance. If you don't receive a letter, please call our office to schedule the follow-up appointment.  Your physician recommends that you continue on your current medications as directed. Please refer to the Current Medication list given to you today.  Thank you for choosing [facility]!!    

## 2015-02-20 DIAGNOSIS — Z1231 Encounter for screening mammogram for malignant neoplasm of breast: Secondary | ICD-10-CM | POA: Diagnosis not present

## 2015-03-06 DIAGNOSIS — Z961 Presence of intraocular lens: Secondary | ICD-10-CM | POA: Diagnosis not present

## 2015-03-06 DIAGNOSIS — E119 Type 2 diabetes mellitus without complications: Secondary | ICD-10-CM | POA: Diagnosis not present

## 2015-03-15 DIAGNOSIS — J0100 Acute maxillary sinusitis, unspecified: Secondary | ICD-10-CM | POA: Diagnosis not present

## 2015-03-28 DIAGNOSIS — E039 Hypothyroidism, unspecified: Secondary | ICD-10-CM | POA: Diagnosis not present

## 2015-03-28 DIAGNOSIS — E780 Pure hypercholesterolemia: Secondary | ICD-10-CM | POA: Diagnosis not present

## 2015-03-28 DIAGNOSIS — I10 Essential (primary) hypertension: Secondary | ICD-10-CM | POA: Diagnosis not present

## 2015-03-28 DIAGNOSIS — E119 Type 2 diabetes mellitus without complications: Secondary | ICD-10-CM | POA: Diagnosis not present

## 2015-04-05 DIAGNOSIS — M1990 Unspecified osteoarthritis, unspecified site: Secondary | ICD-10-CM | POA: Diagnosis not present

## 2015-04-05 DIAGNOSIS — E039 Hypothyroidism, unspecified: Secondary | ICD-10-CM | POA: Diagnosis not present

## 2015-04-05 DIAGNOSIS — I10 Essential (primary) hypertension: Secondary | ICD-10-CM | POA: Diagnosis not present

## 2015-04-05 DIAGNOSIS — Z0001 Encounter for general adult medical examination with abnormal findings: Secondary | ICD-10-CM | POA: Diagnosis not present

## 2015-04-05 DIAGNOSIS — E119 Type 2 diabetes mellitus without complications: Secondary | ICD-10-CM | POA: Diagnosis not present

## 2015-04-05 DIAGNOSIS — F328 Other depressive episodes: Secondary | ICD-10-CM | POA: Diagnosis not present

## 2015-04-17 ENCOUNTER — Encounter (HOSPITAL_COMMUNITY): Payer: Self-pay | Admitting: *Deleted

## 2015-04-17 ENCOUNTER — Emergency Department (HOSPITAL_COMMUNITY): Payer: Medicare Other

## 2015-04-17 ENCOUNTER — Emergency Department (HOSPITAL_COMMUNITY)
Admission: EM | Admit: 2015-04-17 | Discharge: 2015-04-18 | Disposition: A | Discharge status: Home / Self Care | Payer: Medicare Other | Attending: Emergency Medicine | Admitting: Emergency Medicine

## 2015-04-17 DIAGNOSIS — Z7982 Long term (current) use of aspirin: Secondary | ICD-10-CM | POA: Insufficient documentation

## 2015-04-17 DIAGNOSIS — X58XXXA Exposure to other specified factors, initial encounter: Secondary | ICD-10-CM | POA: Insufficient documentation

## 2015-04-17 DIAGNOSIS — Z8742 Personal history of other diseases of the female genital tract: Secondary | ICD-10-CM | POA: Diagnosis not present

## 2015-04-17 DIAGNOSIS — Y998 Other external cause status: Secondary | ICD-10-CM | POA: Diagnosis not present

## 2015-04-17 DIAGNOSIS — Y9289 Other specified places as the place of occurrence of the external cause: Secondary | ICD-10-CM | POA: Insufficient documentation

## 2015-04-17 DIAGNOSIS — R079 Chest pain, unspecified: Secondary | ICD-10-CM | POA: Diagnosis not present

## 2015-04-17 DIAGNOSIS — T189XXA Foreign body of alimentary tract, part unspecified, initial encounter: Secondary | ICD-10-CM | POA: Insufficient documentation

## 2015-04-17 DIAGNOSIS — K219 Gastro-esophageal reflux disease without esophagitis: Secondary | ICD-10-CM | POA: Diagnosis not present

## 2015-04-17 DIAGNOSIS — M1990 Unspecified osteoarthritis, unspecified site: Secondary | ICD-10-CM | POA: Insufficient documentation

## 2015-04-17 DIAGNOSIS — Z8669 Personal history of other diseases of the nervous system and sense organs: Secondary | ICD-10-CM | POA: Diagnosis not present

## 2015-04-17 DIAGNOSIS — M419 Scoliosis, unspecified: Secondary | ICD-10-CM | POA: Diagnosis not present

## 2015-04-17 DIAGNOSIS — Y9389 Activity, other specified: Secondary | ICD-10-CM | POA: Diagnosis not present

## 2015-04-17 DIAGNOSIS — R0989 Other specified symptoms and signs involving the circulatory and respiratory systems: Secondary | ICD-10-CM | POA: Diagnosis not present

## 2015-04-17 DIAGNOSIS — E119 Type 2 diabetes mellitus without complications: Secondary | ICD-10-CM | POA: Insufficient documentation

## 2015-04-17 DIAGNOSIS — T17228A Food in pharynx causing other injury, initial encounter: Secondary | ICD-10-CM | POA: Diagnosis present

## 2015-04-17 DIAGNOSIS — Z86018 Personal history of other benign neoplasm: Secondary | ICD-10-CM | POA: Insufficient documentation

## 2015-04-17 DIAGNOSIS — E039 Hypothyroidism, unspecified: Secondary | ICD-10-CM | POA: Insufficient documentation

## 2015-04-17 DIAGNOSIS — F329 Major depressive disorder, single episode, unspecified: Secondary | ICD-10-CM | POA: Insufficient documentation

## 2015-04-17 DIAGNOSIS — I10 Essential (primary) hypertension: Secondary | ICD-10-CM | POA: Insufficient documentation

## 2015-04-17 DIAGNOSIS — F419 Anxiety disorder, unspecified: Secondary | ICD-10-CM | POA: Diagnosis not present

## 2015-04-17 DIAGNOSIS — Z79899 Other long term (current) drug therapy: Secondary | ICD-10-CM | POA: Insufficient documentation

## 2015-04-17 NOTE — ED Notes (Signed)
Patient thinks she swallowed chicken bone.  Spoke w/PCP and was told to come to ED.  Denies SOB, dysphagia, odynophagia.

## 2015-04-17 NOTE — ED Notes (Signed)
Dr Dina Rich at bedside,

## 2015-04-17 NOTE — ED Notes (Signed)
Received report, pt resting semi fowler's, family at bedside, pt alert, maintaining airway,, denies any sob, states that she does have cough but has allergies and the cough is not any different,  Update given on plan of care,

## 2015-04-18 DIAGNOSIS — R0989 Other specified symptoms and signs involving the circulatory and respiratory systems: Secondary | ICD-10-CM | POA: Diagnosis not present

## 2015-04-18 DIAGNOSIS — R079 Chest pain, unspecified: Secondary | ICD-10-CM | POA: Diagnosis not present

## 2015-04-18 NOTE — ED Notes (Signed)
Dr Dina Rich at bedside,

## 2015-04-18 NOTE — ED Provider Notes (Signed)
CSN: [id number]     Arrival date & time 04/17/15  2024 History   First MD Initiated Contact with Patient 04/17/15 2305     Chief Complaint  Patient presents with  . foreign body in throat      (Consider location/radiation/quality/duration/timing/severity/associated sxs/prior Treatment) HPI  This is a 69 year old female who presents after possibly ingesting a chicken bone. Patient reports that she was eating chicken wings at dinner. She felt something hard. On her throat. She no longer has a foreign body sensation but when she called her primary care office, they directed her to the ER. She was told that it may need removal as it might not pass. Patient denies any shortness of breath. She does state that after swallowing that she initially had a little bit of anterior chest pain which resolved. Denies any vomiting. She has not had anything to eat or drink but denies any difficulty tolerating her secretions. She has a GI doctor, Dr. Laural Golden.  Past Medical History  Diagnosis Date  . Osteoarthritis   . Essential hypertension, benign   . Type 2 diabetes mellitus   . Hypothyroidism   . Depression   . Anxiety   . Fibrocystic breast disease   . Scoliosis   . DJD (degenerative joint disease)   . Menopause   . Esophageal reflux   . Sleep apnea     Reportedly mild - no CPAP  . Left bundle branch block     Transient - possibly rate related    Past Surgical History  Procedure Laterality Date  . Knee arthroplasty  2001    Right  . Knee arthroplasty  2002    Left  . Cesarean section    . Cholecystectomy    . Left leg vein resection    . Joint fusion-foot      x2 LEFT FOOT  . Rotator cuff repair      LEFT  . Thyroidectomy  1980    3/4  . Tonsillectomy    . Colonoscopy  03/26/2007    RMR:  Diffuse changes of proctocolitis as described above diminutive rectal polyp, cold biopsy/removed, pedunculated polyp of the splenic flexure hot snare removed.  Segmental biopsies the colon taken of  the ileocecal valve ulcer biopsied normal terminal ileum  . Cataract extraction Bilateral   . Colonoscopy N/A 01/05/2014    Procedure: COLONOSCOPY;  Surgeon: Rogene Houston, MD;  [facility];  Service: Endoscopy;  Laterality: N/A;  830  . Breast biopsy      lt x3  . Carpometacarpel suspension plasty Left 03/08/2014    Procedure: LEFT THUMB CARPOMETACARPEL (Lambert) SUSPENSION PLASTY WITH TRAPEZIUM EXCISION;  Surgeon: [name]., MD;  Location: [facility];  Service: Orthopedics;  Laterality: Left;   Family History  Problem Relation Age of Onset  . Hypertension Mother   . Heart failure Father     Died with pneumonia  . Hypertension Father   . Arthritis Father   . Diabetes Father   . Asthma Paternal Grandfather   . Emphysema Paternal Grandfather    History  Substance Use Topics  . Smoking status: Never Smoker   . Smokeless tobacco: Never Used  . Alcohol Use: 0.0 oz/week    0 Standard drinks or equivalent per week     Comment: rare-- glass of wine at christmas   OB History    No data available     Review of Systems  HENT: Negative for sore throat, trouble  swallowing and voice change.   Respiratory: Negative for chest tightness, shortness of breath and stridor.   Cardiovascular: Negative for chest pain.  Gastrointestinal: Negative for nausea, vomiting and abdominal pain.  All other systems reviewed and are negative.     Allergies  Ephedrine; Hibiclens; and Keflex  Home Medications   Prior to Admission medications   Medication Sig Start Date End Date Taking? Authorizing Provider  amLODipine (NORVASC) 10 MG tablet Take 10 mg by mouth every morning.    Yes Historical Provider, MD  aspirin 81 MG tablet Take 81 mg by mouth at bedtime.    Yes Historical Provider, MD  FLUoxetine (PROZAC) 20 MG capsule Take 20 mg by mouth every morning.    Yes Historical Provider, MD  furosemide (LASIX) 40 MG tablet Take 40 mg by mouth daily.   Yes Historical  Provider, MD  ibuprofen (ADVIL,MOTRIN) 200 MG tablet Take 400 mg by mouth at bedtime.    Yes Historical Provider, MD  KRILL OIL PO Take 1 capsule by mouth daily.   Yes Historical Provider, MD  levothyroxine (SYNTHROID, LEVOTHROID) 100 MCG tablet Take 100 mcg by mouth daily before breakfast.  04/05/15  Yes Historical Provider, MD  loratadine (CLARITIN) 10 MG tablet Take 10 mg by mouth daily.   Yes Historical Provider, MD  losartan (COZAAR) 100 MG tablet Take 1 tablet (100 mg total) by mouth every evening. 08/24/13  Yes Satira Sark, MD  meclizine (ANTIVERT) 25 MG tablet Take 25 mg by mouth 3 (three) times daily as needed for dizziness.    Yes Historical Provider, MD  metFORMIN (GLUCOPHAGE) 500 MG tablet Take 500 mg by mouth daily with breakfast.    Yes Historical Provider, MD  metoprolol succinate (TOPROL-XL) 100 MG 24 hr tablet Take 100 mg by mouth every morning. Take with or immediately following a meal.   Yes Historical Provider, MD  simvastatin (ZOCOR) 40 MG tablet Take 40 mg by mouth at bedtime.   Yes Historical Provider, MD  spironolactone (ALDACTONE) 25 MG tablet Take 25 mg by mouth daily. 12/16/13  Yes Historical Provider, MD   BP 124/60 mmHg  Pulse 66  Temp(Src) 97.8 F (36.6 C) (Oral)  Resp 18  Ht 5\' 5"  (1.651 m)  Wt 250 lb (113.399 kg)  BMI 41.60 kg/m2  SpO2 92% Physical Exam  Constitutional: She is oriented to person, place, and time. She appears well-developed and well-nourished. No distress.  HENT:  Head: Normocephalic and atraumatic.  Mouth/Throat: Oropharynx is clear and moist.  Cardiovascular: Normal rate, regular rhythm and normal heart sounds.   No murmur heard. Pulmonary/Chest: Effort normal and breath sounds normal. No respiratory distress. She has no wheezes.  Abdominal: Soft. Bowel sounds are normal. There is no tenderness. There is no rebound.  Neurological: She is alert and oriented to person, place, and time.  Skin: Skin is warm and dry.  Psychiatric: She  has a normal mood and affect.  Nursing note and vitals reviewed.   ED Course  Procedures (including critical care time) Labs Review Labs Reviewed - No data to display  Imaging Review Dg Neck Soft Tissue  04/17/2015   CLINICAL DATA:  Question swallowed foreign body. Sensation of something stuck in throat after swallowing chicken, question chicken bone.  EXAM: NECK SOFT TISSUES - 1+ VIEW  COMPARISON:  None.  FINDINGS: No radio-opaque foreign body identified. Calcifications in the region of the subglottic neck are likely related to car grade cartilage. There is no evidence of retropharyngeal soft tissue  swelling or epiglottic enlargement. The cervical airway is patent.  IMPRESSION: No radiographic findings of radiopaque foreign body.   Electronically Signed   By: Jeb Levering M.D.   On: 04/17/2015 23:30   Dg Abd Acute W/chest  04/18/2015   CLINICAL DATA:  Concern for foreign body. Question swallowed chicken bone. Sensation of something stuck in her throat, episode of chest pain.  EXAM: DG ABDOMEN ACUTE W/ 1V CHEST  COMPARISON:  Chest radiograph 10/13/2014, CT abdomen 07/22/2014  FINDINGS: Unchanged cardiomediastinal contours, heart at the upper limits of normal. The lungs are clear. No radiopaque foreign body. There is no free intra-abdominal air. No dilated bowel loops to suggest obstruction. Small volume of stool throughout the colon. No radiopaque calculi. Surgical clips in the right upper quadrant of the abdomen from cholecystectomy. Moderate scoliotic curvature and degenerative change throughout the thoracolumbar spine. No acute osseous abnormalities are seen.  IMPRESSION: 1. No radiographic findings of radiopaque foreign body. 2. Normal bowel gas pattern.   Electronically Signed   By: Jeb Levering M.D.   On: 04/18/2015 00:58     EKG Interpretation None      MDM   Final diagnoses:  Foreign body, swallowed, initial encounter    Patient presents after ingestion of possible chicken  wing. Nontoxic on exam. Tolerating secretions. ABCs intact. Discussed with patient that given that she does not have any odynophagia or dysphagia, she likely has passed the bone into her stomach. This would not require retrieval. She is concerned given the recommendations by her primary physician. Soft tissue films of the neck are negative. Given patient's concerns, acute abdominal series was also ordered and is reassuring. Patient is able to tolerate fluids without difficulty. Discussed with patient follow-up with primary physician and if necessary her GI doctor if she develops symptoms.  After history, exam, and medical workup I feel the patient has been appropriately medically screened and is safe for discharge home. Pertinent diagnoses were discussed with the patient. Patient was given return precautions.     Merryl Hacker, MD 04/18/15 [phone]

## 2015-04-18 NOTE — Discharge Instructions (Signed)
Swallowed Foreign Body, Adult You have swallowed an object (foreign body). Once the foreign body has passed through the food tube (esophagus), which leads from the mouth to the stomach, it will usually continue through the body without problems. This is because the point where the esophagus enters into the stomach is the narrowest place through which the foreign body must pass. Sometimes the foreign body gets stuck. The most common type of foreign body obstruction in adults is food impaction. Many times, bones from fish or meat products may become lodged in the esophagus or injure the throat on the way down. When there is an object that obstructs the esophagus, the most obvious symptoms are pain and the inability to swallow normally. In some cases, foreign bodies that can be life threatening are swallowed. Examples of these are certain medications and illicit drugs. Often in these instances, patients are afraid of telling what they swallowed. However, it is extremely important to tell the emergency caregiver what was swallowed because life-saving treatment may be needed.  X-ray exams may be taken to find the location of the foreign body. However, some objects do not show up well or may be too small to be seen on an X-ray image. If the foreign body is too large or too sharp, it may be too dangerous to allow it to pass on its own. You may need to see a caregiver who specializes in the digestive system (gastroenterologist). In a few cases, a specialist may need to remove the object using a method called "endoscopy". This involves passing a thin, soft, flexible tube into the food pipe to locate and remove the object. Follow up with your primary doctor or the referral you were given by the emergency caregiver. HOME CARE INSTRUCTIONS   If your caregiver says it is safe for you to eat, then only have liquids and soft foods until your symptoms improve.  Once you are eating normally:  Cut food into small  pieces.  Remove small bones from food.  Remove large seeds and pits from fruit.  Chew your food well.  Do not talk, laugh, or engage in physical activity while eating or swallowing. SEEK MEDICAL CARE IF:  You develop worsening shortness of breath, uncontrollable coughing, chest pains or high fever, greater than 102 F (38.9 C).  You are unable to eat or drink or you feel that food is getting stuck in your throat.  You have choking symptoms or cannot stop drooling.  You develop abdominal pain, vomiting (especially of blood), or rectal bleeding. MAKE SURE YOU:   Understand these instructions.  Will watch your condition.  Will get help right away if you are not doing well or get worse. Document Released: 03/27/2010 Document Revised: 12/30/2011 Document Reviewed: 03/27/2010 [facility] Patient Information 2015 [place], [place]. This information is not intended to replace advice given to you by your health care provider. Make sure you discuss any questions you have with your health care provider.

## 2015-04-20 DIAGNOSIS — E039 Hypothyroidism, unspecified: Secondary | ICD-10-CM | POA: Diagnosis not present

## 2015-04-20 DIAGNOSIS — Z79899 Other long term (current) drug therapy: Secondary | ICD-10-CM | POA: Diagnosis not present

## 2015-04-20 DIAGNOSIS — E780 Pure hypercholesterolemia: Secondary | ICD-10-CM | POA: Diagnosis not present

## 2015-04-20 DIAGNOSIS — I10 Essential (primary) hypertension: Secondary | ICD-10-CM | POA: Diagnosis not present

## 2015-04-20 DIAGNOSIS — Z780 Asymptomatic menopausal state: Secondary | ICD-10-CM | POA: Diagnosis not present

## 2015-04-20 DIAGNOSIS — E119 Type 2 diabetes mellitus without complications: Secondary | ICD-10-CM | POA: Diagnosis not present

## 2015-04-20 DIAGNOSIS — Z7982 Long term (current) use of aspirin: Secondary | ICD-10-CM | POA: Diagnosis not present

## 2015-04-20 DIAGNOSIS — Z13820 Encounter for screening for osteoporosis: Secondary | ICD-10-CM | POA: Diagnosis not present

## 2015-04-20 DIAGNOSIS — Z96653 Presence of artificial knee joint, bilateral: Secondary | ICD-10-CM | POA: Diagnosis not present

## 2015-04-20 DIAGNOSIS — Z791 Long term (current) use of non-steroidal anti-inflammatories (NSAID): Secondary | ICD-10-CM | POA: Diagnosis not present

## 2015-04-20 DIAGNOSIS — M810 Age-related osteoporosis without current pathological fracture: Secondary | ICD-10-CM | POA: Diagnosis not present

## 2015-06-15 DIAGNOSIS — L570 Actinic keratosis: Secondary | ICD-10-CM | POA: Diagnosis not present

## 2015-06-15 DIAGNOSIS — L821 Other seborrheic keratosis: Secondary | ICD-10-CM | POA: Diagnosis not present

## 2015-06-15 DIAGNOSIS — D225 Melanocytic nevi of trunk: Secondary | ICD-10-CM | POA: Diagnosis not present

## 2015-06-15 DIAGNOSIS — D485 Neoplasm of uncertain behavior of skin: Secondary | ICD-10-CM | POA: Diagnosis not present

## 2015-07-05 DIAGNOSIS — E559 Vitamin D deficiency, unspecified: Secondary | ICD-10-CM | POA: Diagnosis not present

## 2015-07-05 DIAGNOSIS — E039 Hypothyroidism, unspecified: Secondary | ICD-10-CM | POA: Diagnosis not present

## 2015-07-05 DIAGNOSIS — E780 Pure hypercholesterolemia: Secondary | ICD-10-CM | POA: Diagnosis not present

## 2015-07-05 DIAGNOSIS — I10 Essential (primary) hypertension: Secondary | ICD-10-CM | POA: Diagnosis not present

## 2015-07-05 DIAGNOSIS — E119 Type 2 diabetes mellitus without complications: Secondary | ICD-10-CM | POA: Diagnosis not present

## 2015-07-05 DIAGNOSIS — E538 Deficiency of other specified B group vitamins: Secondary | ICD-10-CM | POA: Diagnosis not present

## 2015-07-11 DIAGNOSIS — E039 Hypothyroidism, unspecified: Secondary | ICD-10-CM | POA: Diagnosis not present

## 2015-07-11 DIAGNOSIS — M1990 Unspecified osteoarthritis, unspecified site: Secondary | ICD-10-CM | POA: Diagnosis not present

## 2015-07-11 DIAGNOSIS — E119 Type 2 diabetes mellitus without complications: Secondary | ICD-10-CM | POA: Diagnosis not present

## 2015-07-11 DIAGNOSIS — Z23 Encounter for immunization: Secondary | ICD-10-CM | POA: Diagnosis not present

## 2015-07-11 DIAGNOSIS — I10 Essential (primary) hypertension: Secondary | ICD-10-CM | POA: Diagnosis not present

## 2015-08-07 ENCOUNTER — Ambulatory Visit (INDEPENDENT_AMBULATORY_CARE_PROVIDER_SITE_OTHER): Payer: Medicare Other | Admitting: Cardiology

## 2015-08-07 ENCOUNTER — Encounter: Payer: Self-pay | Admitting: Cardiology

## 2015-08-07 VITALS — BP 118/62 | HR 67 | Ht 65.0 in | Wt 265.0 lb

## 2015-08-07 DIAGNOSIS — I447 Left bundle-branch block, unspecified: Secondary | ICD-10-CM

## 2015-08-07 DIAGNOSIS — I10 Essential (primary) hypertension: Secondary | ICD-10-CM

## 2015-08-07 NOTE — Patient Instructions (Signed)
Your physician recommends that you continue on your current medications as directed. Please refer to the Current Medication list given to you today. Your physician recommends that you schedule a follow-up appointment in: 6 months. You will receive a reminder letter in the mail in about 4 months reminding you to call and schedule your appointment. If you don't receive this letter, please contact our office. 

## 2015-08-07 NOTE — Progress Notes (Signed)
Cardiology Office Note  Date: 08/07/2015   ID: Grace Delacruz, DOB Aug 02, 1946, MRN [id number]  PCP: Rory Percy, MD  Primary Cardiologist: Rozann Lesches, MD   Chief Complaint  Patient presents with  . Cardiac follow-up    History of Present Illness: Grace Delacruz is a 69 y.o. female last seen in April. She has a history of intermittently documented left bundle-branch block without clear evidence of ischemic heart disease. She comes in for a routine visit today. She reports no major change in cardiac status, specifically no exertional chest pain, palpitations, or worsening shortness of breath.  She tells me that her elderly mother passed away back in 06/21/23, she is still going through what seems like a normal grieving process in talking with her today. She has not been as regular with her diet and exercise plan, blood sugars have not been well controlled. She has been following with Dr. Nadara Mustard.  I reviewed her ECG from April.   Past Medical History  Diagnosis Date  . Osteoarthritis   . Essential hypertension, benign   . Type 2 diabetes mellitus ([place])   . Hypothyroidism   . Depression   . Anxiety   . Fibrocystic breast disease   . Scoliosis   . DJD (degenerative joint disease)   . Menopause   . Esophageal reflux   . Sleep apnea     Reportedly mild - no CPAP  . Left bundle branch block     Transient - possibly rate related     Current Outpatient Prescriptions  Medication Sig Dispense Refill  . amLODipine (NORVASC) 10 MG tablet Take 10 mg by mouth every morning.     Marland Kitchen aspirin 81 MG tablet Take 81 mg by mouth at bedtime.     Marland Kitchen FLUoxetine (PROZAC) 20 MG capsule Take 20 mg by mouth every morning.     . furosemide (LASIX) 40 MG tablet Take 40 mg by mouth daily.    Marland Kitchen ibuprofen (ADVIL,MOTRIN) 200 MG tablet Take 400 mg by mouth at bedtime.     Marland Kitchen KRILL OIL PO Take 1 capsule by mouth daily.    Marland Kitchen levothyroxine (SYNTHROID, LEVOTHROID) 100 MCG tablet Take 100 mcg by mouth  daily before breakfast.     . loratadine (CLARITIN) 10 MG tablet Take 10 mg by mouth daily.    Marland Kitchen losartan (COZAAR) 100 MG tablet Take 1 tablet (100 mg total) by mouth every evening.    . meclizine (ANTIVERT) 25 MG tablet Take 25 mg by mouth 3 (three) times daily as needed for dizziness.     . metFORMIN (GLUCOPHAGE) 500 MG tablet Take 500 mg by mouth daily with breakfast.     . metoprolol succinate (TOPROL-XL) 100 MG 24 hr tablet Take 100 mg by mouth every morning. Take with or immediately following a meal.    . simvastatin (ZOCOR) 40 MG tablet Take 40 mg by mouth at bedtime.    Marland Kitchen spironolactone (ALDACTONE) 25 MG tablet Take 25 mg by mouth daily.    . Vitamin D, Cholecalciferol, 1000 UNITS CAPS Take 1,000 mg by mouth 2 (two) times daily.     No current facility-administered medications for this visit.    Allergies:  Ephedrine; Hibiclens; and Keflex   Social History: The patient  reports that she has never smoked. She has never used smokeless tobacco. She reports that she drinks alcohol. She reports that she does not use illicit drugs.   ROS:  Please see the history of present illness.  Otherwise, complete review of systems is positive for arthritic discomfort at times.  All other systems are reviewed and negative.   Physical Exam: VS:  BP 118/62 mmHg  Pulse 67  Ht 5\' 5"  (1.651 m)  Wt 265 lb (120.203 kg)  BMI 44.10 kg/m2  SpO2 97%, BMI Body mass index is 44.1 kg/(m^2).  Wt Readings from Last 3 Encounters:  08/07/15 265 lb (120.203 kg)  04/17/15 250 lb (113.399 kg)  02/06/15 267 lb (121.11 kg)     General: Patient appears comfortable at rest. HEENT: Conjunctiva and lids normal, oropharynx clear with moist mucosa. Neck: Supple, no elevated JVP or carotid bruits, no thyromegaly. Lungs: Clear to auscultation, nonlabored breathing at rest. Cardiac: Regular rate and rhythm, no S3 or significant systolic murmur, no pericardial rub. Abdomen: Soft, nontender, no hepatomegaly, bowel sounds  present, no guarding or rebound. Extremities: No pitting edema, distal pulses 2+.   ECG: ECG is not ordered today.  Other Studies Reviewed Today:  1. Echocardiogram done in August 2013 indicated moderate LVH with LVEF of 82-99%, grade 1 diastolic dysfunction, paradoxical septal motion, mild left atrial enlargement. No major abnormalities.   2. Lexiscan Myoview done in 2013 reports no significant ST segment changes and overall normal perfusion, no clear evidence of ischemia.   Assessment and Plan:  1. History of intermittently documented left bundle branch block, possibly rate related. She has cardiac risk factors including hypertension and type 2 diabetes mellitus, but ischemic workup as noted above has been reassuring, and she does not endorse any active angina symptoms at this time. Recommend observation for now.we will repeat ECG around the time of her next visit.  2. Essential hypertension, blood pressure is normal today.  Current medicines were reviewed with the patient today.  Disposition: FU with me in 6 months.   Signed, Satira Sark, MD, [facility] 08/07/2015 2:07 PM    [place] [place] [facility] [place], [place], [place] [address] Phone: [phone]; Fax: [phone]

## 2015-08-15 DIAGNOSIS — M19072 Primary osteoarthritis, left ankle and foot: Secondary | ICD-10-CM | POA: Diagnosis not present

## 2015-08-15 DIAGNOSIS — M19071 Primary osteoarthritis, right ankle and foot: Secondary | ICD-10-CM | POA: Diagnosis not present

## 2015-08-15 DIAGNOSIS — M2021 Hallux rigidus, right foot: Secondary | ICD-10-CM | POA: Diagnosis not present

## 2015-09-24 IMAGING — DX DG ABDOMEN ACUTE W/ 1V CHEST
4 series · 4 of 4 positions shown · non-contrast
Comparison: Chest radiograph 10/13/2014, CT abdomen 07/22/2014

CLINICAL DATA: Concern for foreign body. Question swallowed chicken
bone. Sensation of something stuck in her throat, episode of chest
pain.

EXAM:
DG ABDOMEN ACUTE W/ 1V CHEST

[chest pa]
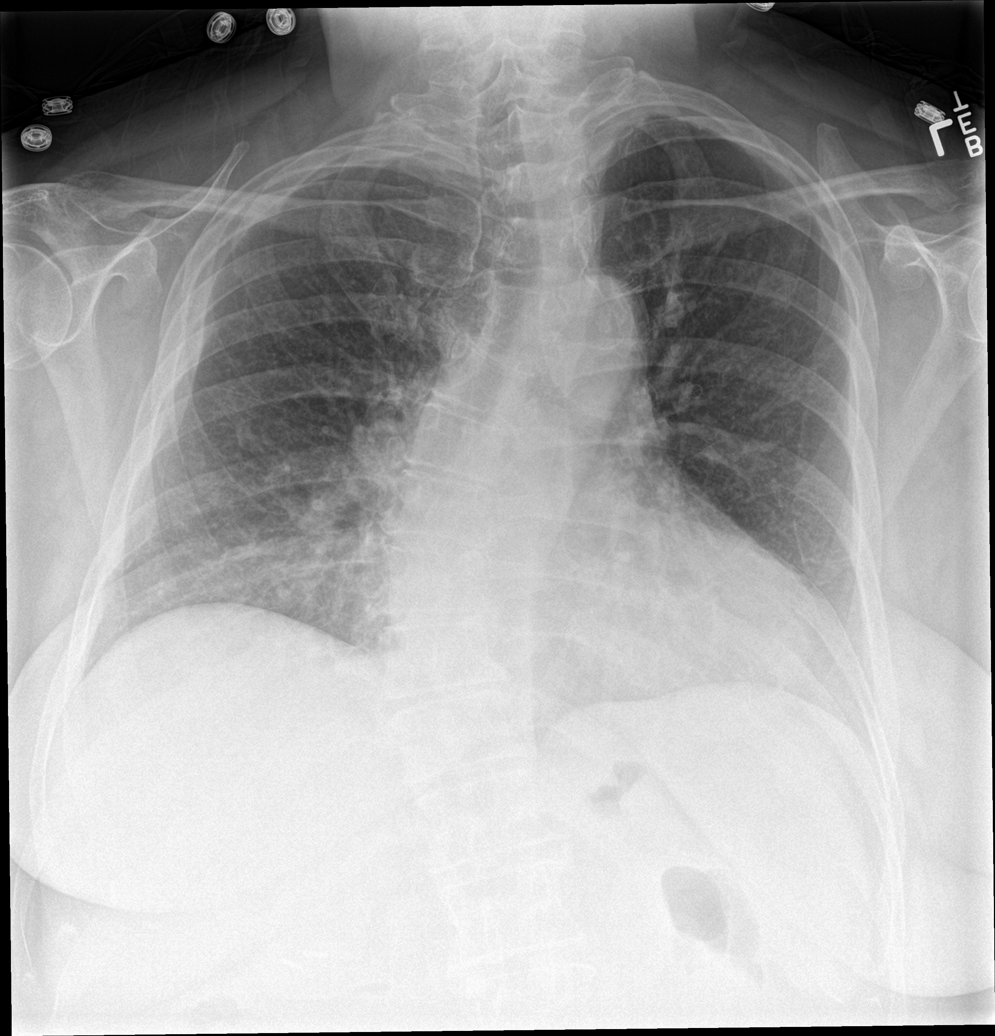

[abdomen erect]
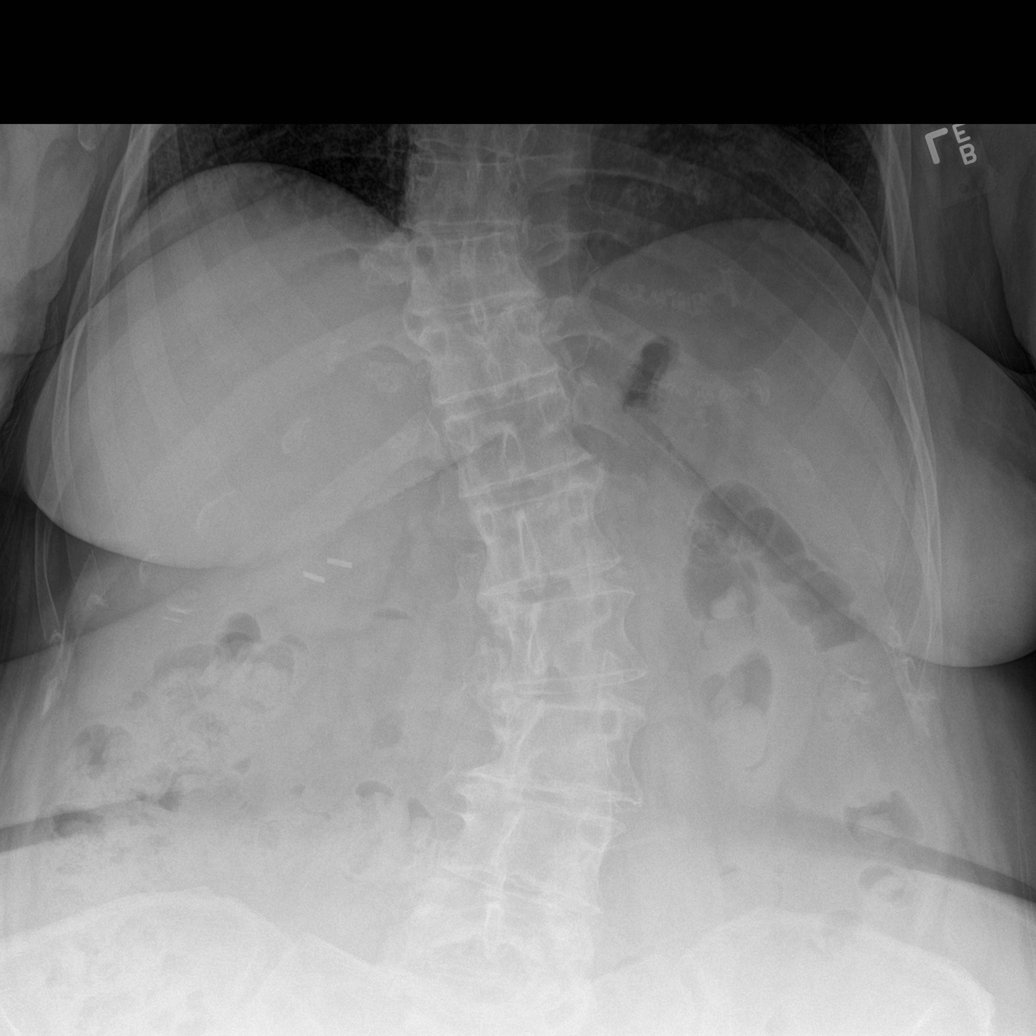

[abdomen supine (1 of 2)]
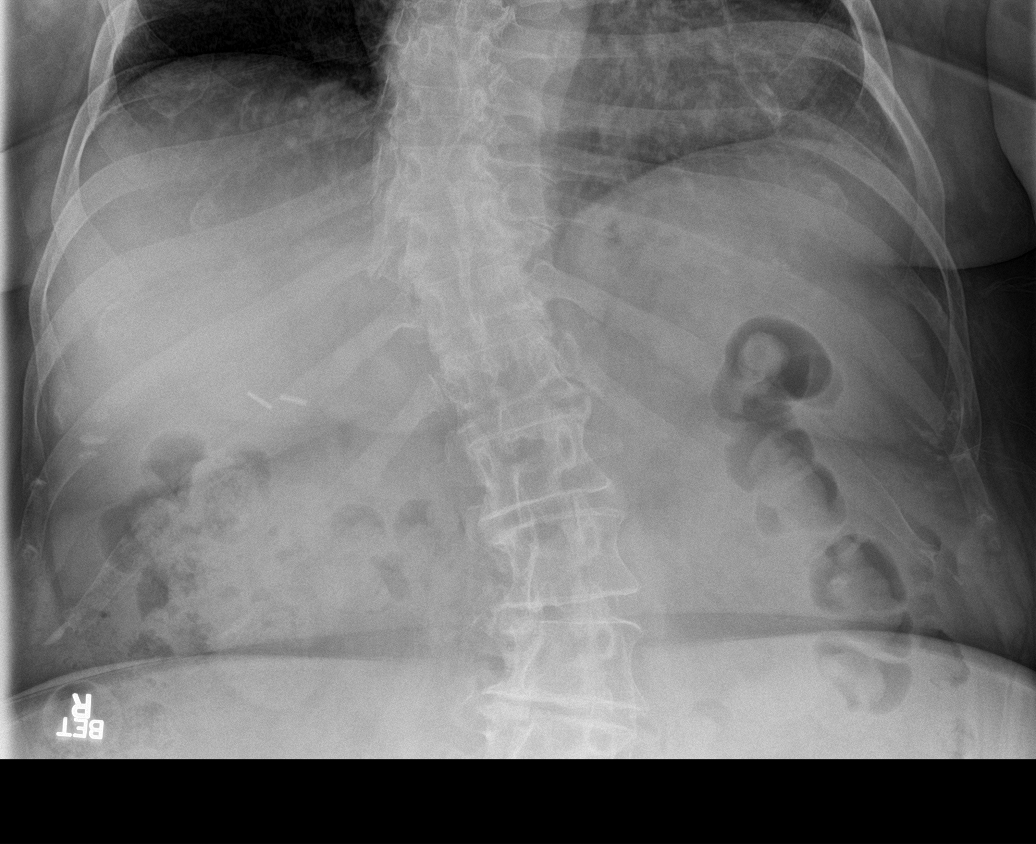

[abdomen supine (2 of 2)]
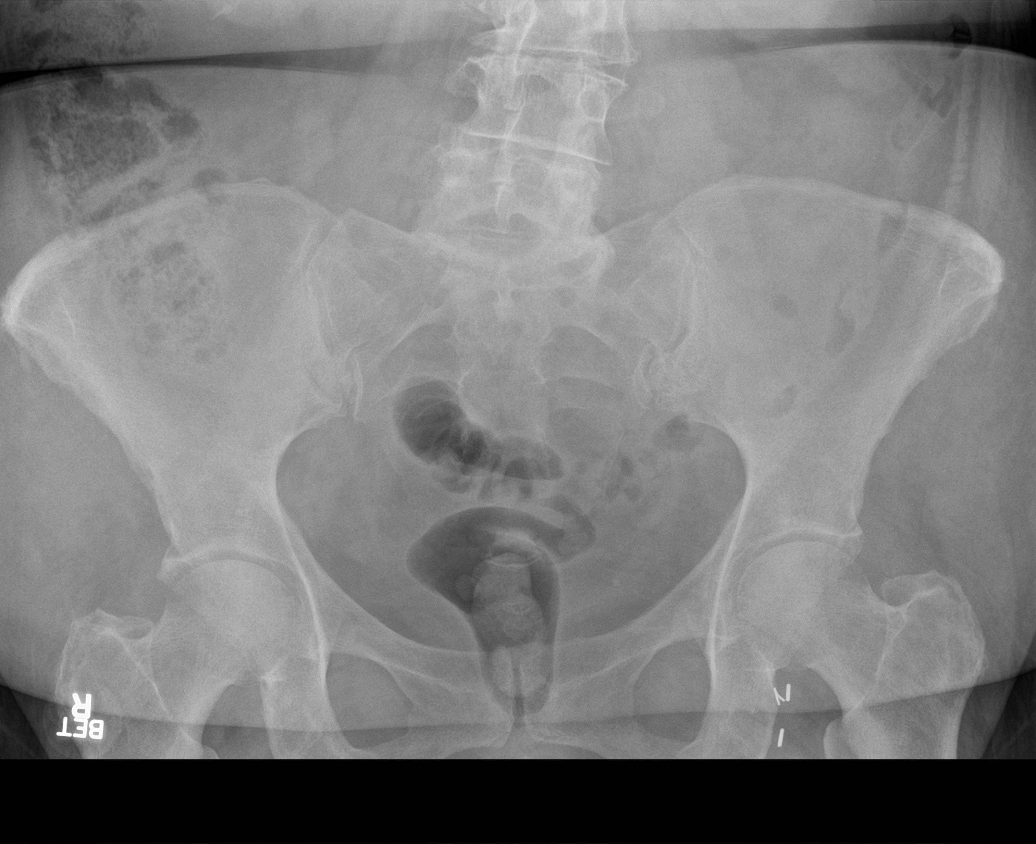

[4 of 4 positions shown; findings below may reference images not displayed]

FINDINGS: Unchanged cardiomediastinal contours, heart at the upper limits of
normal. The lungs are clear. No radiopaque foreign body. There is no
free intra-abdominal air. No dilated bowel loops to suggest
obstruction. Small volume of stool throughout the colon. No
radiopaque calculi. Surgical clips in the right upper quadrant of
the abdomen from cholecystectomy. Moderate scoliotic curvature and
degenerative change throughout the thoracolumbar spine. No acute
osseous abnormalities are seen.
IMPRESSION: 1. No radiographic findings of radiopaque foreign body.
2. Normal bowel gas pattern.

## 2015-09-27 DIAGNOSIS — M4180 Other forms of scoliosis, site unspecified: Secondary | ICD-10-CM | POA: Diagnosis not present

## 2015-09-27 DIAGNOSIS — M41125 Adolescent idiopathic scoliosis, thoracolumbar region: Secondary | ICD-10-CM | POA: Diagnosis not present

## 2015-09-27 DIAGNOSIS — M4125 Other idiopathic scoliosis, thoracolumbar region: Secondary | ICD-10-CM | POA: Diagnosis not present

## 2015-12-06 DIAGNOSIS — M47896 Other spondylosis, lumbar region: Secondary | ICD-10-CM | POA: Diagnosis not present

## 2015-12-06 DIAGNOSIS — M47816 Spondylosis without myelopathy or radiculopathy, lumbar region: Secondary | ICD-10-CM | POA: Diagnosis not present

## 2015-12-06 DIAGNOSIS — M41129 Adolescent idiopathic scoliosis, site unspecified: Secondary | ICD-10-CM | POA: Diagnosis not present

## 2015-12-06 DIAGNOSIS — M41125 Adolescent idiopathic scoliosis, thoracolumbar region: Secondary | ICD-10-CM | POA: Diagnosis not present

## 2015-12-18 DIAGNOSIS — L570 Actinic keratosis: Secondary | ICD-10-CM | POA: Diagnosis not present

## 2016-01-15 DIAGNOSIS — M7631 Iliotibial band syndrome, right leg: Secondary | ICD-10-CM | POA: Diagnosis not present

## 2016-01-15 DIAGNOSIS — Z96651 Presence of right artificial knee joint: Secondary | ICD-10-CM | POA: Diagnosis not present

## 2016-01-15 DIAGNOSIS — Z471 Aftercare following joint replacement surgery: Secondary | ICD-10-CM | POA: Diagnosis not present

## 2016-01-15 DIAGNOSIS — Z96653 Presence of artificial knee joint, bilateral: Secondary | ICD-10-CM | POA: Diagnosis not present

## 2016-01-15 DIAGNOSIS — M7632 Iliotibial band syndrome, left leg: Secondary | ICD-10-CM | POA: Diagnosis not present

## 2016-01-15 DIAGNOSIS — Z96652 Presence of left artificial knee joint: Secondary | ICD-10-CM | POA: Diagnosis not present

## 2016-01-17 DIAGNOSIS — D519 Vitamin B12 deficiency anemia, unspecified: Secondary | ICD-10-CM | POA: Diagnosis not present

## 2016-01-17 DIAGNOSIS — I10 Essential (primary) hypertension: Secondary | ICD-10-CM | POA: Diagnosis not present

## 2016-01-17 DIAGNOSIS — Z1159 Encounter for screening for other viral diseases: Secondary | ICD-10-CM | POA: Diagnosis not present

## 2016-01-17 DIAGNOSIS — E559 Vitamin D deficiency, unspecified: Secondary | ICD-10-CM | POA: Diagnosis not present

## 2016-01-17 DIAGNOSIS — E7800 Pure hypercholesterolemia, unspecified: Secondary | ICD-10-CM | POA: Diagnosis not present

## 2016-01-17 DIAGNOSIS — E119 Type 2 diabetes mellitus without complications: Secondary | ICD-10-CM | POA: Diagnosis not present

## 2016-01-17 DIAGNOSIS — E039 Hypothyroidism, unspecified: Secondary | ICD-10-CM | POA: Diagnosis not present

## 2016-01-24 DIAGNOSIS — M7632 Iliotibial band syndrome, left leg: Secondary | ICD-10-CM | POA: Diagnosis not present

## 2016-01-24 DIAGNOSIS — M7631 Iliotibial band syndrome, right leg: Secondary | ICD-10-CM | POA: Diagnosis not present

## 2016-01-25 DIAGNOSIS — E119 Type 2 diabetes mellitus without complications: Secondary | ICD-10-CM | POA: Diagnosis not present

## 2016-01-25 DIAGNOSIS — Z0001 Encounter for general adult medical examination with abnormal findings: Secondary | ICD-10-CM | POA: Diagnosis not present

## 2016-01-25 DIAGNOSIS — Z1389 Encounter for screening for other disorder: Secondary | ICD-10-CM | POA: Diagnosis not present

## 2016-01-25 DIAGNOSIS — I10 Essential (primary) hypertension: Secondary | ICD-10-CM | POA: Diagnosis not present

## 2016-01-25 DIAGNOSIS — M1990 Unspecified osteoarthritis, unspecified site: Secondary | ICD-10-CM | POA: Diagnosis not present

## 2016-01-25 DIAGNOSIS — E039 Hypothyroidism, unspecified: Secondary | ICD-10-CM | POA: Diagnosis not present

## 2016-01-25 DIAGNOSIS — F330 Major depressive disorder, recurrent, mild: Secondary | ICD-10-CM | POA: Diagnosis not present

## 2016-01-25 DIAGNOSIS — Z13220 Encounter for screening for lipoid disorders: Secondary | ICD-10-CM | POA: Diagnosis not present

## 2016-01-29 DIAGNOSIS — M7631 Iliotibial band syndrome, right leg: Secondary | ICD-10-CM | POA: Diagnosis not present

## 2016-01-29 DIAGNOSIS — M7632 Iliotibial band syndrome, left leg: Secondary | ICD-10-CM | POA: Diagnosis not present

## 2016-01-31 DIAGNOSIS — M7631 Iliotibial band syndrome, right leg: Secondary | ICD-10-CM | POA: Diagnosis not present

## 2016-01-31 DIAGNOSIS — M7632 Iliotibial band syndrome, left leg: Secondary | ICD-10-CM | POA: Diagnosis not present

## 2016-02-07 DIAGNOSIS — M7632 Iliotibial band syndrome, left leg: Secondary | ICD-10-CM | POA: Diagnosis not present

## 2016-02-07 DIAGNOSIS — M7631 Iliotibial band syndrome, right leg: Secondary | ICD-10-CM | POA: Diagnosis not present

## 2016-02-14 ENCOUNTER — Ambulatory Visit (INDEPENDENT_AMBULATORY_CARE_PROVIDER_SITE_OTHER): Payer: Medicare Other | Admitting: Cardiology

## 2016-02-14 ENCOUNTER — Encounter: Payer: Self-pay | Admitting: Cardiology

## 2016-02-14 VITALS — BP 112/69 | HR 61 | Ht 65.0 in | Wt 266.6 lb

## 2016-02-14 DIAGNOSIS — I447 Left bundle-branch block, unspecified: Secondary | ICD-10-CM | POA: Diagnosis not present

## 2016-02-14 NOTE — Progress Notes (Signed)
Cardiology Office Note  Date: 02/14/2016   ID: Grace Delacruz, DOB [birth year], MRN [id number]  PCP: Rory Percy, MD  Primary Cardiologist: Rozann Lesches, MD   Chief Complaint  Patient presents with  . Left bundle branch block    History of Present Illness: Grace Delacruz is a 70 y.o. female last seen in April 2016. She presents for a routine follow-up visit. She has a history of intermittent left bundle branch block, most likely rate related. Prior ischemic and structural cardiac testing has been reassuring. She reports NYHA class II dyspnea, no interval exertional chest pain. I reviewed her ECG today which shows normal sinus rhythm with decreased R wave progression, nonspecific T-wave changes, and low voltage.  Past Medical History  Diagnosis Date  . Osteoarthritis   . Essential hypertension, benign   . Type 2 diabetes mellitus ([place])   . Hypothyroidism   . Depression   . Anxiety   . Fibrocystic breast disease   . Scoliosis   . DJD (degenerative joint disease)   . Menopause   . Esophageal reflux   . Sleep apnea     Reportedly mild - no CPAP  . Left bundle branch block     Transient - possibly rate related     Current Outpatient Prescriptions  Medication Sig Dispense Refill  . amLODipine (NORVASC) 10 MG tablet Take 10 mg by mouth every morning.     Marland Kitchen aspirin 81 MG tablet Take 81 mg by mouth at bedtime.     Marland Kitchen FLUoxetine (PROZAC) 20 MG capsule Take 20 mg by mouth every morning.     . furosemide (LASIX) 40 MG tablet Take 40 mg by mouth daily.    Marland Kitchen ibuprofen (ADVIL,MOTRIN) 200 MG tablet Take 400 mg by mouth at bedtime.     Marland Kitchen KRILL OIL PO Take 1 capsule by mouth daily.    Marland Kitchen levothyroxine (SYNTHROID, LEVOTHROID) 100 MCG tablet Take 100 mcg by mouth daily before breakfast.     . loratadine (CLARITIN) 10 MG tablet Take 10 mg by mouth daily.    Marland Kitchen losartan (COZAAR) 100 MG tablet Take 1 tablet (100 mg total) by mouth every evening.    . meclizine (ANTIVERT) 25 MG tablet  Take 25 mg by mouth 3 (three) times daily as needed for dizziness.     . metFORMIN (GLUCOPHAGE) 500 MG tablet Take 500 mg by mouth daily with breakfast.     . metoprolol succinate (TOPROL-XL) 100 MG 24 hr tablet Take 100 mg by mouth every morning. Take with or immediately following a meal.    . simvastatin (ZOCOR) 40 MG tablet Take 40 mg by mouth at bedtime.    Marland Kitchen spironolactone (ALDACTONE) 25 MG tablet Take 25 mg by mouth daily.    . Vitamin D, Cholecalciferol, 1000 UNITS CAPS Take 1,000 mg by mouth 2 (two) times daily.     No current facility-administered medications for this visit.   Allergies:  Ephedrine; Hibiclens; and Keflex   Social History: The patient  reports that she has never smoked. She has never used smokeless tobacco. She reports that she drinks alcohol. She reports that she does not use illicit drugs.   ROS:  Please see the history of present illness. Otherwise, complete review of systems is positive for left-sided TMJ symptoms.  All other systems are reviewed and negative.   Physical Exam: VS:  BP 112/69 mmHg  Pulse 61  Ht 5\' 5"  (1.651 m)  Wt 266 lb 9.6 oz (120.929  kg)  BMI 44.36 kg/m2  SpO2 96%, BMI Body mass index is 44.36 kg/(m^2).  Wt Readings from Last 3 Encounters:  02/14/16 266 lb 9.6 oz (120.929 kg)  08/07/15 265 lb (120.203 kg)  04/17/15 250 lb (113.399 kg)    General: Obese woman, appears comfortable at rest. HEENT: Conjunctiva and lids normal, oropharynx clear with moist mucosa. Neck: Supple, no elevated JVP or carotid bruits, no thyromegaly. Lungs: Clear to auscultation, nonlabored breathing at rest. Cardiac: Regular rate and rhythm, no S3 or significant systolic murmur, no pericardial rub. Abdomen: Soft, nontender, no hepatomegaly, bowel sounds present, no guarding or rebound. Extremities: No pitting edema, distal pulses 2+.  ECG: I personally reviewed the prior tracing from 02/06/2015 which showed sinus rhythm with nonspecific T-wave changes and  borderline low voltage..  Other Studies Reviewed Today:  Echocardiogram August 2013: Moderate LVH with LVEF of [id number], grade 1 diastolic dysfunction, paradoxical septal motion, mild left atrial enlargement. No major abnormalities.   [place] 2013: No significant ST segment changes and overall normal perfusion, no clear evidence of ischemia.  Assessment and Plan:  1. History of intermittent left bundle-branch block, most likely rate related. She does not report any exertional chest discomfort, palpitations, or syncope. Previous cardiac structural and ischemic workup was reassuring. I reviewed her ECG today which shows normal QRS. We will continue observation.  2. Essential hypertension, blood pressure is well controlled today.  Current medicines were reviewed with the patient today.   Orders Placed This Encounter  Procedures  . EKG 12-Lead    Disposition: FU with me in 1 year.   Signed, Satira Sark, MD, [facility] 02/14/2016 3:38 PM    [place] [facility] [place], [place], [place] [address] Phone: [phone]; Fax: [phone]

## 2016-02-14 NOTE — Patient Instructions (Signed)

## 2016-02-15 DIAGNOSIS — M7632 Iliotibial band syndrome, left leg: Secondary | ICD-10-CM | POA: Diagnosis not present

## 2016-02-15 DIAGNOSIS — M7631 Iliotibial band syndrome, right leg: Secondary | ICD-10-CM | POA: Diagnosis not present

## 2016-02-19 DIAGNOSIS — M7541 Impingement syndrome of right shoulder: Secondary | ICD-10-CM | POA: Diagnosis not present

## 2016-02-19 DIAGNOSIS — Z471 Aftercare following joint replacement surgery: Secondary | ICD-10-CM | POA: Diagnosis not present

## 2016-02-19 DIAGNOSIS — Z96653 Presence of artificial knee joint, bilateral: Secondary | ICD-10-CM | POA: Diagnosis not present

## 2016-03-06 DIAGNOSIS — M7541 Impingement syndrome of right shoulder: Secondary | ICD-10-CM | POA: Diagnosis not present

## 2016-03-08 DIAGNOSIS — Z1231 Encounter for screening mammogram for malignant neoplasm of breast: Secondary | ICD-10-CM | POA: Diagnosis not present

## 2016-03-12 DIAGNOSIS — M7541 Impingement syndrome of right shoulder: Secondary | ICD-10-CM | POA: Diagnosis not present

## 2016-03-19 DIAGNOSIS — L570 Actinic keratosis: Secondary | ICD-10-CM | POA: Diagnosis not present

## 2016-03-19 DIAGNOSIS — M7541 Impingement syndrome of right shoulder: Secondary | ICD-10-CM | POA: Diagnosis not present

## 2016-04-01 DIAGNOSIS — R32 Unspecified urinary incontinence: Secondary | ICD-10-CM | POA: Diagnosis not present

## 2016-04-01 DIAGNOSIS — R3915 Urgency of urination: Secondary | ICD-10-CM | POA: Diagnosis not present

## 2016-04-01 DIAGNOSIS — N281 Cyst of kidney, acquired: Secondary | ICD-10-CM | POA: Diagnosis not present

## 2016-04-01 DIAGNOSIS — R350 Frequency of micturition: Secondary | ICD-10-CM | POA: Diagnosis not present

## 2016-04-08 DIAGNOSIS — Z96653 Presence of artificial knee joint, bilateral: Secondary | ICD-10-CM | POA: Diagnosis not present

## 2016-04-08 DIAGNOSIS — Z471 Aftercare following joint replacement surgery: Secondary | ICD-10-CM | POA: Diagnosis not present

## 2016-04-08 DIAGNOSIS — M7541 Impingement syndrome of right shoulder: Secondary | ICD-10-CM | POA: Diagnosis not present

## 2016-05-14 DIAGNOSIS — R350 Frequency of micturition: Secondary | ICD-10-CM | POA: Diagnosis not present

## 2016-05-14 DIAGNOSIS — R3915 Urgency of urination: Secondary | ICD-10-CM | POA: Diagnosis not present

## 2016-06-17 ENCOUNTER — Other Ambulatory Visit: Payer: Self-pay

## 2016-06-19 DIAGNOSIS — L280 Lichen simplex chronicus: Secondary | ICD-10-CM | POA: Diagnosis not present

## 2016-06-19 DIAGNOSIS — L820 Inflamed seborrheic keratosis: Secondary | ICD-10-CM | POA: Diagnosis not present

## 2016-06-19 DIAGNOSIS — D485 Neoplasm of uncertain behavior of skin: Secondary | ICD-10-CM | POA: Diagnosis not present

## 2016-06-19 DIAGNOSIS — L570 Actinic keratosis: Secondary | ICD-10-CM | POA: Diagnosis not present

## 2016-07-03 DIAGNOSIS — E11319 Type 2 diabetes mellitus with unspecified diabetic retinopathy without macular edema: Secondary | ICD-10-CM | POA: Diagnosis not present

## 2016-07-25 ENCOUNTER — Ambulatory Visit (INDEPENDENT_AMBULATORY_CARE_PROVIDER_SITE_OTHER): Payer: Medicare Other | Admitting: Orthopedic Surgery

## 2016-07-25 DIAGNOSIS — M6701 Short Achilles tendon (acquired), right ankle: Secondary | ICD-10-CM | POA: Diagnosis not present

## 2016-07-25 DIAGNOSIS — M2041 Other hammer toe(s) (acquired), right foot: Secondary | ICD-10-CM

## 2016-07-29 ENCOUNTER — Other Ambulatory Visit (HOSPITAL_COMMUNITY): Payer: Self-pay | Admitting: Family

## 2016-07-29 DIAGNOSIS — I10 Essential (primary) hypertension: Secondary | ICD-10-CM | POA: Diagnosis not present

## 2016-07-29 DIAGNOSIS — E7800 Pure hypercholesterolemia, unspecified: Secondary | ICD-10-CM | POA: Diagnosis not present

## 2016-07-29 DIAGNOSIS — Z6841 Body Mass Index (BMI) 40.0 and over, adult: Secondary | ICD-10-CM | POA: Diagnosis not present

## 2016-07-29 DIAGNOSIS — E039 Hypothyroidism, unspecified: Secondary | ICD-10-CM | POA: Diagnosis not present

## 2016-07-29 DIAGNOSIS — E119 Type 2 diabetes mellitus without complications: Secondary | ICD-10-CM | POA: Diagnosis not present

## 2016-07-29 DIAGNOSIS — M1990 Unspecified osteoarthritis, unspecified site: Secondary | ICD-10-CM | POA: Diagnosis not present

## 2016-07-29 DIAGNOSIS — M419 Scoliosis, unspecified: Secondary | ICD-10-CM | POA: Diagnosis not present

## 2016-07-29 DIAGNOSIS — R05 Cough: Secondary | ICD-10-CM | POA: Diagnosis not present

## 2016-08-01 DIAGNOSIS — I10 Essential (primary) hypertension: Secondary | ICD-10-CM | POA: Diagnosis not present

## 2016-08-01 DIAGNOSIS — Z6841 Body Mass Index (BMI) 40.0 and over, adult: Secondary | ICD-10-CM | POA: Diagnosis not present

## 2016-08-01 DIAGNOSIS — F330 Major depressive disorder, recurrent, mild: Secondary | ICD-10-CM | POA: Diagnosis not present

## 2016-08-01 DIAGNOSIS — E039 Hypothyroidism, unspecified: Secondary | ICD-10-CM | POA: Diagnosis not present

## 2016-08-01 DIAGNOSIS — Z23 Encounter for immunization: Secondary | ICD-10-CM | POA: Diagnosis not present

## 2016-08-05 ENCOUNTER — Other Ambulatory Visit (HOSPITAL_COMMUNITY): Payer: Self-pay | Admitting: *Deleted

## 2016-08-05 ENCOUNTER — Encounter (HOSPITAL_COMMUNITY): Payer: Self-pay

## 2016-08-05 ENCOUNTER — Other Ambulatory Visit (HOSPITAL_COMMUNITY): Payer: Self-pay | Admitting: Family

## 2016-08-05 ENCOUNTER — Encounter (HOSPITAL_COMMUNITY)
Admission: RE | Admit: 2016-08-05 | Discharge: 2016-08-05 | Disposition: A | Discharge status: Home / Self Care | Payer: Medicare Other | Source: Ambulatory Visit | Attending: Orthopedic Surgery | Admitting: Orthopedic Surgery

## 2016-08-05 DIAGNOSIS — M205X1 Other deformities of toe(s) (acquired), right foot: Secondary | ICD-10-CM | POA: Diagnosis not present

## 2016-08-05 DIAGNOSIS — Z7984 Long term (current) use of oral hypoglycemic drugs: Secondary | ICD-10-CM | POA: Diagnosis not present

## 2016-08-05 DIAGNOSIS — Z6841 Body Mass Index (BMI) 40.0 and over, adult: Secondary | ICD-10-CM | POA: Diagnosis not present

## 2016-08-05 DIAGNOSIS — M1990 Unspecified osteoarthritis, unspecified site: Secondary | ICD-10-CM | POA: Diagnosis not present

## 2016-08-05 DIAGNOSIS — E119 Type 2 diabetes mellitus without complications: Secondary | ICD-10-CM | POA: Diagnosis not present

## 2016-08-05 DIAGNOSIS — Z79899 Other long term (current) drug therapy: Secondary | ICD-10-CM | POA: Diagnosis not present

## 2016-08-05 DIAGNOSIS — K219 Gastro-esophageal reflux disease without esophagitis: Secondary | ICD-10-CM | POA: Diagnosis not present

## 2016-08-05 DIAGNOSIS — I10 Essential (primary) hypertension: Secondary | ICD-10-CM | POA: Diagnosis not present

## 2016-08-05 DIAGNOSIS — M24574 Contracture, right foot: Secondary | ICD-10-CM | POA: Diagnosis not present

## 2016-08-05 HISTORY — DX: Restless legs syndrome: G25.81

## 2016-08-05 LAB — GLUCOSE, CAPILLARY: Glucose-Capillary: 187 mg/dL — ABNORMAL HIGH (ref 65–99)

## 2016-08-05 LAB — BASIC METABOLIC PANEL
ANION GAP: 6 (ref 5–15)
BUN: 19 mg/dL (ref 6–20)
CHLORIDE: 107 mmol/L (ref 101–111)
CO2: 26 mmol/L (ref 22–32)
CREATININE: 1.28 mg/dL — AB (ref 0.44–1.00)
Calcium: 9.6 mg/dL (ref 8.9–10.3)
GFR calc non Af Amer: 42 mL/min — ABNORMAL LOW (ref >60)
GFR, EST AFRICAN AMERICAN: 48 mL/min — AB (ref >60)
Glucose, Bld: 176 mg/dL — ABNORMAL HIGH (ref 65–99)
POTASSIUM: 4.5 mmol/L (ref 3.5–5.1)
SODIUM: 139 mmol/L (ref 135–145)

## 2016-08-05 LAB — CBC
HEMATOCRIT: 42.7 % (ref 36.0–46.0)
HEMOGLOBIN: 14.2 g/dL (ref 12.0–15.0)
MCH: 30.4 pg (ref 26.0–34.0)
MCHC: 33.3 g/dL (ref 30.0–36.0)
MCV: 91.4 fL (ref 78.0–100.0)
PLATELETS: 254 10*3/uL (ref 150–400)
RBC: 4.67 MIL/uL (ref 3.87–5.11)
RDW: 12.8 % (ref 11.5–15.5)
WBC: 9.5 10*3/uL (ref 4.0–10.5)

## 2016-08-05 NOTE — Progress Notes (Signed)
Pt. Has allergic  Reaction to CHG. Pt. Instructed to shower twice using an anti-bacterial soap,once the night before surgery and once the morning of surgery.

## 2016-08-05 NOTE — Pre-Procedure Instructions (Signed)
Grace Delacruz  08/05/2016      [facility] [address] [place] [address] Phone: [phone] Fax: [phone]    Your procedure is scheduled on 08-07-2016  Wednesday   Report to [facility] Admitting at 6:30  A.M.   Call this number if you have problems the morning of surgery:  [phone]   Remember:  Do not eat food or drink liquids after midnight.   Take these medicines the morning of surgery with A SIP OF WATER Amlodipine(Norvasc),Fluoxetine(Prozac),Levothyroxine(Synthroid),Metoprolol(Toprol).omeprazole(Prilosec),             STOP ASPIRIN,ANTIINFLAMATORIES (IBUPROFEN,ALEVE,MOTRIN,ADVIL,GOODY'S POWDERS),HERBAL SUPPLEMENTS,FISH OIL,AND VITAMINS 5-7 DAYS PRIOR TO SURGERY                 How to Manage Your Diabetes Before and After Surgery  Why is it important to control my blood sugar before and after surgery? . Improving blood sugar levels before and after surgery helps healing and can limit problems. . A way of improving blood sugar control is eating a healthy diet by: o  Eating less sugar and carbohydrates o  Increasing activity/exercise o  Talking with your doctor about reaching your blood sugar goals . High blood sugars (greater than 180 mg/dL) can raise your risk of infections and slow your recovery, so you will need to focus on controlling your diabetes during the weeks before surgery. . Make sure that the doctor who takes care of your diabetes knows about your planned surgery including the date and location.  How do I manage my blood sugar before surgery? . Check your blood sugar at least 4 times a day, starting 2 days before surgery, to make sure that the level is not too high or low. o Check your blood sugar the morning of your surgery when you wake up and every 2 hours until you get to the Short Stay unit. . If your blood sugar is less than 70 mg/dL, you will need to treat for low blood sugar: o Do  not take insulin. o Treat a low blood sugar (less than 70 mg/dL) with  cup of clear juice (cranberry or apple), 4 glucose tablets, OR glucose gel. o Recheck blood sugar in 15 minutes after treatment (to make sure it is greater than 70 mg/dL). If your blood sugar is not greater than 70 mg/dL on recheck, call [phone] for further instructions. . Report your blood sugar to the short stay nurse when you get to Short Stay.  . If you are admitted to the hospital after surgery: o Your blood sugar will be checked by the staff and you will probably be given insulin after surgery (instead of oral diabetes medicines) to make sure you have good blood sugar levels. o The goal for blood sugar control after surgery is 80-180 mg/dL.              WHAT DO I DO ABOUT MY DIABETES MEDICATION?   Marland Kitchen Do not take oral diabetes medicines (pills) the morning of surgery. .     Reviewed and Endorsed by [facility] Patient Education Committee, August 2015   Do not wear jewelry, make-up or nail polish.  Do not wear lotions, powders, or perfumes, or deoderant.  Do not shave 48 hours prior to surgery.     Do not bring valuables to the hospital.  [facility] is not responsible for any belongings or valuables.  Contacts, dentures or bridgework may  not be worn into surgery.  Leave your suitcase in the car.  After surgery it may be brought to your room.  For patients admitted to the hospital, discharge time will be determined by your treatment team.  Patients discharged the day of surgery will not be allowed to drive home.   Special instructions:  See attached Sheet for instructions on CHG showers

## 2016-08-06 LAB — HEMOGLOBIN A1C
HEMOGLOBIN A1C: 6.7 % — AB (ref 4.8–5.6)
MEAN PLASMA GLUCOSE: 146 mg/dL

## 2016-08-06 NOTE — Anesthesia Preprocedure Evaluation (Addendum)
Anesthesia Evaluation  Patient identified by MRN, date of birth, ID band Patient awake    Reviewed: Allergy & Precautions, NPO status , Patient's Chart, lab work & pertinent test results  History of Anesthesia Complications (+) PONV  Airway Mallampati: III  TM Distance: >3 FB Neck ROM: Full    Dental  (+) Dental Advisory Given   Pulmonary neg pulmonary ROS,    breath sounds clear to auscultation       Cardiovascular hypertension, Pt. on medications and Pt. on home beta blockers + dysrhythmias (intermittent LBBB)  Rhythm:Regular Rate:Normal  Echocardiogram August 2013: Moderate LVH with LVEF of A999333, grade 1 diastolic dysfunction, paradoxical septal motion, mild left atrial enlargement. No major abnormalities.   [place] 2013: No significant ST segment changes and overall normal perfusion, no clear evidence of ischemia.   Neuro/Psych Anxiety Depression negative neurological ROS     GI/Hepatic GERD  ,  Endo/Other  diabetes, Type 2, Oral Hypoglycemic AgentsHypothyroidism Morbid obesity  Renal/GU Renal InsufficiencyRenal disease     Musculoskeletal  (+) Arthritis ,   Abdominal   Peds  Hematology negative hematology ROS (+)   Anesthesia Other Findings   Reproductive/Obstetrics                            Lab Results  Component Value Date   WBC 9.5 08/05/2016   HGB 14.2 08/05/2016   HCT 42.7 08/05/2016   MCV 91.4 08/05/2016   PLT 254 08/05/2016   Lab Results  Component Value Date   CREATININE 1.28 (H) 08/05/2016   BUN 19 08/05/2016   NA 139 08/05/2016   K 4.5 08/05/2016   CL 107 08/05/2016   CO2 26 08/05/2016    Anesthesia Physical Anesthesia Plan  ASA: III  Anesthesia Plan: General and Regional   Post-op Pain Management:  Regional for Post-op pain   Induction: Intravenous  Airway Management Planned: Oral ETT and LMA  Additional Equipment:   Intra-op Plan:    Post-operative Plan: Extubation in OR  Informed Consent: I have reviewed the patients History and Physical, chart, labs and discussed the procedure including the risks, benefits and alternatives for the proposed anesthesia with the patient or authorized representative who has indicated his/her understanding and acceptance.   Dental advisory given  Plan Discussed with: CRNA  Anesthesia Plan Comments:         Anesthesia Quick Evaluation

## 2016-08-07 ENCOUNTER — Encounter (HOSPITAL_COMMUNITY): Payer: Self-pay | Admitting: *Deleted

## 2016-08-07 ENCOUNTER — Ambulatory Visit (HOSPITAL_COMMUNITY): Payer: Medicare Other | Admitting: Emergency Medicine

## 2016-08-07 ENCOUNTER — Encounter (HOSPITAL_COMMUNITY)
Admission: RE | Disposition: A | Discharge status: Home / Self Care | Payer: Self-pay | Source: Ambulatory Visit | Attending: Orthopedic Surgery

## 2016-08-07 ENCOUNTER — Ambulatory Visit (HOSPITAL_COMMUNITY)
Admission: RE | Admit: 2016-08-07 | Discharge: 2016-08-07 | Disposition: A | Discharge status: Home / Self Care | Payer: Medicare Other | Source: Ambulatory Visit | Attending: Orthopedic Surgery | Admitting: Orthopedic Surgery

## 2016-08-07 ENCOUNTER — Ambulatory Visit (HOSPITAL_COMMUNITY): Payer: Medicare Other | Admitting: Anesthesiology

## 2016-08-07 DIAGNOSIS — Z79899 Other long term (current) drug therapy: Secondary | ICD-10-CM | POA: Diagnosis not present

## 2016-08-07 DIAGNOSIS — M205X1 Other deformities of toe(s) (acquired), right foot: Secondary | ICD-10-CM | POA: Diagnosis not present

## 2016-08-07 DIAGNOSIS — E119 Type 2 diabetes mellitus without complications: Secondary | ICD-10-CM | POA: Diagnosis not present

## 2016-08-07 DIAGNOSIS — Z7984 Long term (current) use of oral hypoglycemic drugs: Secondary | ICD-10-CM | POA: Insufficient documentation

## 2016-08-07 DIAGNOSIS — M24574 Contracture, right foot: Secondary | ICD-10-CM | POA: Insufficient documentation

## 2016-08-07 DIAGNOSIS — Z6841 Body Mass Index (BMI) 40.0 and over, adult: Secondary | ICD-10-CM | POA: Insufficient documentation

## 2016-08-07 DIAGNOSIS — K219 Gastro-esophageal reflux disease without esophagitis: Secondary | ICD-10-CM | POA: Diagnosis not present

## 2016-08-07 DIAGNOSIS — M1990 Unspecified osteoarthritis, unspecified site: Secondary | ICD-10-CM | POA: Insufficient documentation

## 2016-08-07 DIAGNOSIS — I10 Essential (primary) hypertension: Secondary | ICD-10-CM | POA: Insufficient documentation

## 2016-08-07 DIAGNOSIS — M6701 Short Achilles tendon (acquired), right ankle: Secondary | ICD-10-CM

## 2016-08-07 DIAGNOSIS — M205X2 Other deformities of toe(s) (acquired), left foot: Secondary | ICD-10-CM

## 2016-08-07 DIAGNOSIS — M79671 Pain in right foot: Secondary | ICD-10-CM | POA: Diagnosis not present

## 2016-08-07 DIAGNOSIS — G8918 Other acute postprocedural pain: Secondary | ICD-10-CM | POA: Diagnosis not present

## 2016-08-07 HISTORY — PX: GASTROCNEMIUS RECESSION: SHX863

## 2016-08-07 HISTORY — PX: WEIL OSTEOTOMY: SHX5044

## 2016-08-07 LAB — GLUCOSE, CAPILLARY
GLUCOSE-CAPILLARY: 154 mg/dL — AB (ref 65–99)
Glucose-Capillary: 152 mg/dL — ABNORMAL HIGH (ref 65–99)

## 2016-08-07 SURGERY — OSTEOTOMY, WEIL
Anesthesia: Regional | Site: Leg Lower | Laterality: Right

## 2016-08-07 MED ORDER — PROPOFOL 10 MG/ML IV BOLUS
INTRAVENOUS | Status: AC
  Filled 2016-08-07: qty 20

## 2016-08-07 MED ORDER — FENTANYL CITRATE (PF) 100 MCG/2ML IJ SOLN
INTRAMUSCULAR | Status: DC | PRN
  Administered 2016-08-07 (×2): 50 ug via INTRAVENOUS

## 2016-08-07 MED ORDER — ONDANSETRON HCL 4 MG/2ML IJ SOLN
INTRAMUSCULAR | Status: DC | PRN
  Administered 2016-08-07: 4 mg via INTRAVENOUS

## 2016-08-07 MED ORDER — CLINDAMYCIN PHOSPHATE 900 MG/50ML IV SOLN
900.0000 mg | INTRAVENOUS | Status: AC
  Administered 2016-08-07: 900 mg via INTRAVENOUS

## 2016-08-07 MED ORDER — GLYCOPYRROLATE 0.2 MG/ML IJ SOLN
INTRAMUSCULAR | Status: DC | PRN
  Administered 2016-08-07: .1 mg via INTRAVENOUS
  Administered 2016-08-07: .2 mg via INTRAVENOUS

## 2016-08-07 MED ORDER — FENTANYL CITRATE (PF) 100 MCG/2ML IJ SOLN
INTRAMUSCULAR | Status: AC
  Filled 2016-08-07: qty 2

## 2016-08-07 MED ORDER — MIDAZOLAM HCL 2 MG/2ML IJ SOLN
INTRAMUSCULAR | Status: AC
  Filled 2016-08-07: qty 2

## 2016-08-07 MED ORDER — SCOPOLAMINE 1 MG/3DAYS TD PT72
MEDICATED_PATCH | TRANSDERMAL | Status: AC
  Filled 2016-08-07: qty 1

## 2016-08-07 MED ORDER — 0.9 % SODIUM CHLORIDE (POUR BTL) OPTIME
TOPICAL | Status: DC | PRN
  Administered 2016-08-07: 1000 mL

## 2016-08-07 MED ORDER — PROMETHAZINE HCL 25 MG/ML IJ SOLN: 6.2500 mg | INTRAMUSCULAR | Status: DC | PRN

## 2016-08-07 MED ORDER — MIDAZOLAM HCL 5 MG/5ML IJ SOLN
INTRAMUSCULAR | Status: DC | PRN
  Administered 2016-08-07: 2 mg via INTRAVENOUS

## 2016-08-07 MED ORDER — POVIDONE-IODINE 10 % EX SWAB: 2.0000 "application " | Freq: Once | CUTANEOUS | Status: DC

## 2016-08-07 MED ORDER — SCOPOLAMINE 1 MG/3DAYS TD PT72
MEDICATED_PATCH | TRANSDERMAL | Status: DC | PRN
  Administered 2016-08-07: 1 via TRANSDERMAL

## 2016-08-07 MED ORDER — PHENYLEPHRINE HCL 10 MG/ML IJ SOLN
INTRAMUSCULAR | Status: DC | PRN
  Administered 2016-08-07 (×2): 80 ug via INTRAVENOUS
  Administered 2016-08-07 (×2): 120 ug via INTRAVENOUS

## 2016-08-07 MED ORDER — HYDROCODONE-ACETAMINOPHEN 5-325 MG PO TABS
1.0000 | ORAL_TABLET | ORAL | 0 refills | Status: DC | PRN
Start: 2016-08-07 — End: 2016-12-25

## 2016-08-07 MED ORDER — PROPOFOL 10 MG/ML IV BOLUS
INTRAVENOUS | Status: DC | PRN
  Administered 2016-08-07: 250 mg via INTRAVENOUS

## 2016-08-07 MED ORDER — LACTATED RINGERS IV SOLN
INTRAVENOUS | Status: DC | PRN
Start: 2016-08-07 — End: 2016-08-07
  Administered 2016-08-07 (×2): via INTRAVENOUS

## 2016-08-07 MED ORDER — HYDROMORPHONE HCL 2 MG/ML IJ SOLN: 0.2500 mg | INTRAMUSCULAR | Status: DC | PRN

## 2016-08-07 MED ORDER — BUPIVACAINE-EPINEPHRINE (PF) 0.5% -1:200000 IJ SOLN
INTRAMUSCULAR | Status: DC | PRN
  Administered 2016-08-07: 25 mL

## 2016-08-07 MED ORDER — ROPIVACAINE HCL 7.5 MG/ML IJ SOLN
INTRAMUSCULAR | Status: DC | PRN
  Administered 2016-08-07: 15 mL via PERINEURAL

## 2016-08-07 MED ORDER — CLINDAMYCIN PHOSPHATE 900 MG/50ML IV SOLN
INTRAVENOUS | Status: AC
  Filled 2016-08-07: qty 50

## 2016-08-07 SURGICAL SUPPLY — 61 items
BIT DRILL 1.5MM 95MM JCC (BIT) IMPLANT
BLADE AVERAGE 25MMX9MM (BLADE) ×2
BLADE AVERAGE 25X9 (BLADE) ×2 IMPLANT
BLADE MINI RND TIP GREEN BEAV (BLADE) IMPLANT
BNDG CMPR 9X4 STRL LF SNTH (GAUZE/BANDAGES/DRESSINGS) ×2
BNDG COHESIVE 1X5 TAN STRL LF (GAUZE/BANDAGES/DRESSINGS) IMPLANT
BNDG COHESIVE 4X5 TAN STRL (GAUZE/BANDAGES/DRESSINGS) ×2 IMPLANT
BNDG COHESIVE 6X5 TAN STRL LF (GAUZE/BANDAGES/DRESSINGS) ×4 IMPLANT
BNDG ESMARK 4X9 LF (GAUZE/BANDAGES/DRESSINGS) ×4 IMPLANT
BNDG GAUZE ELAST 4 BULKY (GAUZE/BANDAGES/DRESSINGS) ×2 IMPLANT
BNDG GAUZE STRTCH 6 (GAUZE/BANDAGES/DRESSINGS) IMPLANT
CANISTER SUCTION 2500CC (MISCELLANEOUS) ×2 IMPLANT
CORDS BIPOLAR (ELECTRODE) ×2 IMPLANT
COTTON STERILE ROLL (GAUZE/BANDAGES/DRESSINGS) IMPLANT
COVER MAYO STAND STRL (DRAPES) ×2 IMPLANT
COVER SURGICAL LIGHT HANDLE (MISCELLANEOUS) ×6 IMPLANT
CUFF TOURNIQUET SINGLE 18IN (TOURNIQUET CUFF) IMPLANT
CUFF TOURNIQUET SINGLE 24IN (TOURNIQUET CUFF) IMPLANT
DRAPE OEC MINIVIEW 54X84 (DRAPES) IMPLANT
DRAPE U-SHAPE 47X51 STRL (DRAPES) ×4 IMPLANT
DRILL BIT QC 1.5MM (BIT) ×2
DRSG ADAPTIC 3X8 NADH LF (GAUZE/BANDAGES/DRESSINGS) ×2 IMPLANT
DRSG EMULSION OIL 3X3 NADH (GAUZE/BANDAGES/DRESSINGS) ×2 IMPLANT
DRSG PAD ABDOMINAL 8X10 ST (GAUZE/BANDAGES/DRESSINGS) ×2 IMPLANT
DURAPREP 26ML APPLICATOR (WOUND CARE) ×4 IMPLANT
ELECT REM PT RETURN 9FT ADLT (ELECTROSURGICAL) ×4
ELECTRODE REM PT RTRN 9FT ADLT (ELECTROSURGICAL) ×2 IMPLANT
GAUZE SPONGE 4X4 12PLY STRL (GAUZE/BANDAGES/DRESSINGS) ×4 IMPLANT
GLOVE BIOGEL PI IND STRL 9 (GLOVE) ×2 IMPLANT
GLOVE BIOGEL PI INDICATOR 9 (GLOVE) ×2
GLOVE SURG ORTHO 9.0 STRL STRW (GLOVE) ×4 IMPLANT
GOWN STRL REUS W/ TWL XL LVL3 (GOWN DISPOSABLE) ×4 IMPLANT
GOWN STRL REUS W/TWL XL LVL3 (GOWN DISPOSABLE) ×8
KIT BASIN OR (CUSTOM PROCEDURE TRAY) ×4 IMPLANT
KIT ROOM TURNOVER OR (KITS) ×4 IMPLANT
MANIFOLD NEPTUNE II (INSTRUMENTS) ×2 IMPLANT
NDL HYPO 25GX1X1/2 BEV (NEEDLE) IMPLANT
NEEDLE HYPO 25GX1X1/2 BEV (NEEDLE) IMPLANT
NS IRRIG 1000ML POUR BTL (IV SOLUTION) ×4 IMPLANT
PACK ORTHO EXTREMITY (CUSTOM PROCEDURE TRAY) ×4 IMPLANT
PAD ARMBOARD 7.5X6 YLW CONV (MISCELLANEOUS) ×8 IMPLANT
PAD CAST 4YDX4 CTTN HI CHSV (CAST SUPPLIES) IMPLANT
PADDING CAST ABS 4INX4YD NS (CAST SUPPLIES)
PADDING CAST ABS COTTON 4X4 ST (CAST SUPPLIES) ×2 IMPLANT
PADDING CAST COTTON 4X4 STRL (CAST SUPPLIES)
SCREW CORTEX ST 2.0X12 (Screw) ×4 IMPLANT
SPECIMEN JAR SMALL (MISCELLANEOUS) ×2 IMPLANT
SPONGE LAP 18X18 X RAY DECT (DISPOSABLE) ×4 IMPLANT
SUCTION FRAZIER HANDLE 10FR (MISCELLANEOUS) ×2
SUCTION TUBE FRAZIER 10FR DISP (MISCELLANEOUS) IMPLANT
SUT ETHILON 2 0 FS 18 (SUTURE) IMPLANT
SUT ETHILON 2 0 PSLX (SUTURE) ×8 IMPLANT
SUT VIC AB 2-0 FS1 27 (SUTURE) ×2 IMPLANT
SYR CONTROL 10ML LL (SYRINGE) IMPLANT
TOWEL OR 17X24 6PK STRL BLUE (TOWEL DISPOSABLE) ×4 IMPLANT
TOWEL OR 17X26 10 PK STRL BLUE (TOWEL DISPOSABLE) ×4 IMPLANT
TUBE CONNECTING 12'X1/4 (SUCTIONS) ×1
TUBE CONNECTING 12X1/4 (SUCTIONS) ×1 IMPLANT
UNDERPAD 30X30 (UNDERPADS AND DIAPERS) ×2 IMPLANT
WATER STERILE IRR 1000ML POUR (IV SOLUTION) ×2 IMPLANT
YANKAUER SUCT BULB TIP NO VENT (SUCTIONS) ×2 IMPLANT

## 2016-08-07 NOTE — Anesthesia Procedure Notes (Signed)
Anesthesia Regional Block:  Adductor canal block  Pre-Anesthetic Checklist: ,, timeout performed, Correct Patient, Correct Site, Correct Laterality, Correct Procedure, Correct Position, site marked, Risks and benefits discussed,  Surgical consent,  Pre-op evaluation,  At surgeon's request and post-op pain management  Laterality: Right  Prep: chloraprep       Needles:  Injection technique: Single-shot  Needle Type: Echogenic Needle     Needle Length: 9cm 9 cm Needle Gauge: 21 and 21 G    Additional Needles:  Procedures: ultrasound guided (picture in chart) Adductor canal block Narrative:  Start time: 08/07/2016 8:00 AM End time: 08/07/2016 8:06 AM Injection made incrementally with aspirations every 5 mL.  Performed by: Personally  Anesthesiologist: Suzette Battiest

## 2016-08-07 NOTE — Anesthesia Procedure Notes (Signed)
Anesthesia Regional Block:  Popliteal block  Pre-Anesthetic Checklist: ,, timeout performed, Correct Patient, Correct Site, Correct Laterality, Correct Procedure, Correct Position, site marked, Risks and benefits discussed,  Surgical consent,  Pre-op evaluation,  At surgeon's request and post-op pain management  Laterality: Right  Prep: chloraprep       Needles:  Injection technique: Single-shot  Needle Type: Echogenic Stimulator Needle     Needle Length: 9cm 9 cm Needle Gauge: 21 and 21 G    Additional Needles:  Procedures: ultrasound guided (picture in chart) and nerve stimulator Popliteal block  Nerve Stimulator or Paresthesia:  Response: tibial and peroneal, 0.5 mA,   Additional Responses:   Narrative:  Start time: 08/07/2016 8:07 AM End time: 08/07/2016 8:15 AM Injection made incrementally with aspirations every 5 mL.  Performed by: Personally  Anesthesiologist: Suzette Battiest

## 2016-08-07 NOTE — Progress Notes (Signed)
Orthopedic Tech Progress Note Patient Details:  Grace Delacruz 1946-01-15 [id number]  Ortho Devices Type of Ortho Device: Postop shoe/boot Ortho Device/Splint Location: rle Ortho Device/Splint Interventions: Application   Javen Ridings 08/07/2016, 11:21 AM Viewed order from doctor's order list

## 2016-08-07 NOTE — H&P (Signed)
Grace Delacruz is an 71 y.o. female.   Chief Complaint: Right forefoot pain HPI: Patient is a 70 year old woman with diabetic insensate neuropathy with recurrent forefoot ulceration beneath the metatarsal heads with heel cord contracture who has failed conservative stretching and conservative wound care.  Past Medical History:  Diagnosis Date  . Anxiety   . Depression   . DJD (degenerative joint disease)   . Esophageal reflux   . Essential hypertension, benign   . Fibrocystic breast disease   . Hypothyroidism   . Left bundle branch block    Transient - possibly rate related   . Menopause   . Osteoarthritis   . PONV (postoperative nausea and vomiting)    pt. would like to have a scopolamine patch  . Restless leg syndrome   . Scoliosis   . Type 2 diabetes mellitus ([place])     Past Surgical History:  Procedure Laterality Date  . BREAST BIOPSY     lt x3  . CARPOMETACARPEL SUSPENSION PLASTY Left 03/08/2014   Procedure: LEFT THUMB CARPOMETACARPEL ([place]) SUSPENSION PLASTY WITH TRAPEZIUM EXCISION;  Surgeon: [name]., MD;  Location: [place];  Service: Orthopedics;  Laterality: Left;  . CATARACT EXTRACTION Bilateral   . CESAREAN SECTION    . CHOLECYSTECTOMY    . COLONOSCOPY  03/26/2007   RMR:  Diffuse changes of proctocolitis as described above diminutive rectal polyp, cold biopsy/removed, pedunculated polyp of the splenic flexure hot snare removed.  Segmental biopsies the colon taken of the ileocecal valve ulcer biopsied normal terminal ileum  . COLONOSCOPY N/A 01/05/2014   Procedure: COLONOSCOPY;  Surgeon: Rogene Houston, MD;  [facility];  Service: Endoscopy;  Laterality: N/A;  830  . Joint fusion-foot     x2 LEFT FOOT  . KNEE ARTHROPLASTY  2001   Right  . KNEE ARTHROPLASTY  2002   Left  . Left leg vein resection    . ROTATOR CUFF REPAIR     LEFT  . THYROIDECTOMY  1980   3/4  . TONSILLECTOMY      Family History  Problem Relation Age  of Onset  . Heart failure Father     Died with pneumonia  . Hypertension Father   . Arthritis Father   . Diabetes Father   . Hypertension Mother   . Asthma Paternal Grandfather   . Emphysema Paternal Grandfather    Social History:  reports that she has never smoked. She has never used smokeless tobacco. She reports that she drinks alcohol. She reports that she does not use drugs.  Allergies:  Allergies  Allergen Reactions  . Ephedrine Palpitations    Heart rate fast-  . Hibiclens [Chlorhexidine Gluconate] Rash    SKIN REDNESS AND BURNING SENSATION    NO CHG  . Keflex [Cephalexin] Itching and Rash    Medications Prior to Admission  Medication Sig Dispense Refill  . amLODipine (NORVASC) 10 MG tablet Take 10 mg by mouth every morning.     Marland Kitchen aspirin EC 81 MG tablet Take 81 mg by mouth every evening.    Marland Kitchen dextromethorphan-guaiFENesin (MUCINEX DM) 30-600 MG 12hr tablet Take 1 tablet by mouth 2 (two) times daily as needed (for cold symptoms.).    Marland Kitchen FLUoxetine (PROZAC) 20 MG capsule Take 20 mg by mouth every morning.     . furosemide (LASIX) 40 MG tablet Take 40 mg by mouth daily.    Marland Kitchen ibuprofen (ADVIL,MOTRIN) 200 MG tablet Take 400 mg by mouth  at bedtime.     Javier Docker Oil 350 MG CAPS Take by mouth.    . levothyroxine (SYNTHROID, LEVOTHROID) 100 MCG tablet Take 100 mcg by mouth daily before breakfast.     . loratadine (CLARITIN) 10 MG tablet Take 10 mg by mouth daily as needed for allergies.     Marland Kitchen losartan (COZAAR) 100 MG tablet Take 1 tablet (100 mg total) by mouth every evening.    . meclizine (ANTIVERT) 25 MG tablet Take 25 mg by mouth 3 (three) times daily as needed for dizziness.     . metFORMIN (GLUCOPHAGE-XR) 500 MG 24 hr tablet Take 500 mg by mouth daily.    . metoprolol succinate (TOPROL-XL) 100 MG 24 hr tablet Take 100 mg by mouth every morning. Take with or immediately following a meal.    . omeprazole (PRILOSEC) 20 MG capsule Take 20 mg by mouth daily before breakfast.    .  simvastatin (ZOCOR) 40 MG tablet Take 40 mg by mouth at bedtime.    Marland Kitchen spironolactone (ALDACTONE) 25 MG tablet Take 25 mg by mouth daily.    Marland Kitchen triamcinolone cream (KENALOG) 0.5 % Apply 1 application topically 3 (three) times daily as needed. For rash/irritation.    . Vitamin D, Cholecalciferol, 1000 UNITS CAPS Take 1,000 mg by mouth 2 (two) times daily.      Results for orders placed or performed during the hospital encounter of 08/05/16 (from the past 48 hour(s))  Glucose, capillary     Status: Abnormal   Collection Time: 08/05/16  1:00 PM  Result Value Ref Range   Glucose-Capillary 187 (H) 65 - 99 mg/dL   Comment 1 Notify RN    Comment 2 Document in Chart   Basic metabolic panel     Status: Abnormal   Collection Time: 08/05/16  1:12 PM  Result Value Ref Range   Sodium 139 135 - 145 mmol/L   Potassium 4.5 3.5 - 5.1 mmol/L   Chloride 107 101 - 111 mmol/L   CO2 26 22 - 32 mmol/L   Glucose, Bld 176 (H) 65 - 99 mg/dL   BUN 19 6 - 20 mg/dL   Creatinine, Ser 1.28 (H) 0.44 - 1.00 mg/dL   Calcium 9.6 8.9 - 10.3 mg/dL   GFR calc non Af Amer 42 (L) >60 mL/min   GFR calc Af Amer 48 (L) >60 mL/min    Comment: (NOTE) The eGFR has been calculated using the CKD EPI equation. This calculation has not been validated in all clinical situations. eGFR's persistently <60 mL/min signify possible Chronic Kidney Disease.    Anion gap 6 5 - 15  CBC     Status: None   Collection Time: 08/05/16  1:12 PM  Result Value Ref Range   WBC 9.5 4.0 - 10.5 K/uL   RBC 4.67 3.87 - 5.11 MIL/uL   Hemoglobin 14.2 12.0 - 15.0 g/dL   HCT 42.7 36.0 - 46.0 %   MCV 91.4 78.0 - 100.0 fL   MCH 30.4 26.0 - 34.0 pg   MCHC 33.3 30.0 - 36.0 g/dL   RDW 12.8 11.5 - 15.5 %   Platelets 254 150 - 400 K/uL  Hemoglobin A1c     Status: Abnormal   Collection Time: 08/05/16  1:12 PM  Result Value Ref Range   Hgb A1c MFr Bld 6.7 (H) 4.8 - 5.6 %    Comment: (NOTE)         Pre-diabetes: 5.7 - 6.4  Diabetes: >6.4          Glycemic control for adults with diabetes: <7.0    Mean Plasma Glucose 146 mg/dL    Comment: (NOTE) Performed At: [facility] [place], [place] [id number] Lindon Romp MD IV:[id number]    No results found.  Review of Systems  All other systems reviewed and are negative.   There were no vitals taken for this visit. Physical Exam   On examination patient is alert oriented no adenopathy well-dressed normal affect normal respiratory effort she does have an antalgic gait she has heel cord tightness with dorsiflexion about 10 short of neutral with knee extended. She has callus beneath the second and third metatarsal heads with a prominent second and third metatarsal. Assessment/Plan Assessment: Diabetic insensate neuropathy with metatarsalgia recurrent ulcerations beneath the second and third metatarsals with heel cord contracture.  Plan: We'll plan for a Weil osteotomy for shortening of the second and third metatarsals a gastrocnemius recession to improve dorsiflexion risk and benefits were discussed including infection neurovascular injury persistent pain DVT pulmonary embolus need for additional surgery. Patient states she understands wishes to proceed at this time.  Newt Minion, MD 08/07/2016, 6:37 AM

## 2016-08-07 NOTE — Transfer of Care (Signed)
Immediate Anesthesia Transfer of Care Note  Patient: Grace Delacruz  Procedure(s) Performed: Procedure(s): WEIL OSTEOTOMY 2nd and 3rd Metatarsal Right Foot (Right) GASTROCNEMIUS RECESSION (Right)  Patient Location: PACU  Anesthesia Type:GA combined with regional for post-op pain  Level of Consciousness: awake, alert  and oriented  Airway & Oxygen Therapy: Patient Spontanous Breathing and Patient connected to nasal cannula oxygen  Post-op Assessment: Report given to RN, Post -op Vital signs reviewed and stable and Patient moving all extremities X 4  Post vital signs: Reviewed and stable  Last Vitals:  Vitals:   08/07/16 0947 08/07/16 0958  BP: 108/71   Pulse: 77 75  Resp: 14 14  Temp:  36.1 C    Last Pain:  Vitals:   08/07/16 0706  TempSrc: Oral      Patients Stated Pain Goal: 5 ([id number])  Complications: No apparent anesthesia complications

## 2016-08-07 NOTE — Anesthesia Procedure Notes (Signed)
Procedure Name: LMA Insertion Date/Time: 08/07/2016 8:36 AM Performed by: Mariea Clonts Pre-anesthesia Checklist: Patient identified, Emergency Drugs available, Suction available and Patient being monitored Patient Re-evaluated:Patient Re-evaluated prior to inductionOxygen Delivery Method: Circle System Utilized Preoxygenation: Pre-oxygenation with 100% oxygen Intubation Type: IV induction Ventilation: Mask ventilation without difficulty LMA: LMA inserted LMA Size: 4.0 Number of attempts: 1 Airway Equipment and Method: Bite block Placement Confirmation: positive ETCO2 Tube secured with: Tape Dental Injury: Teeth and Oropharynx as per pre-operative assessment

## 2016-08-07 NOTE — Anesthesia Postprocedure Evaluation (Signed)
Anesthesia Post Note  Patient: Grace Delacruz  Procedure(s) Performed: Procedure(s) (LRB): WEIL OSTEOTOMY 2nd and 3rd Metatarsal Right Foot (Right) GASTROCNEMIUS RECESSION (Right)  Patient location during evaluation: PACU Anesthesia Type: General and Regional Level of consciousness: awake and alert Pain management: pain level controlled Vital Signs Assessment: post-procedure vital signs reviewed and stable Respiratory status: spontaneous breathing, nonlabored ventilation, respiratory function stable and patient connected to nasal cannula oxygen Cardiovascular status: blood pressure returned to baseline and stable Postop Assessment: no signs of nausea or vomiting Anesthetic complications: no    Last Vitals:  Vitals:   08/07/16 0947 08/07/16 0958  BP: 108/71   Pulse: 77 75  Resp: 14 14  Temp:  36.1 C    Last Pain:  Vitals:   08/07/16 0706  TempSrc: Oral                 Tiajuana Amass

## 2016-08-07 NOTE — Op Note (Signed)
08/07/2016  9:12 AM  PATIENT:  Grace Delacruz    PRE-OPERATIVE DIAGNOSIS:  Claw Toes with Heel Cord Contracture Right Foot  POST-OPERATIVE DIAGNOSIS:  Same  PROCEDURE:  WEIL OSTEOTOMY 2nd and 3rd Metatarsal Right Foot, GASTROCNEMIUS RECESSION  SURGEON:  Newt Minion, MD  PHYSICIAN ASSISTANT:None ANESTHESIA:   General  PREOPERATIVE INDICATIONS:  [name] is a  70 y.o. female with a diagnosis of Claw Toes with Heel Cord Contracture Right Foot who failed conservative measures and elected for surgical management.    The risks benefits and alternatives were discussed with the patient preoperatively including but not limited to the risks of infection, bleeding, nerve injury, cardiopulmonary complications, the need for revision surgery, among others, and the patient was willing to proceed.  OPERATIVE IMPLANTS: Mini frag screws 2.012 mm 2  OPERATIVE FINDINGS: Chronic degenerative changes of the gastrocsoleus complex  OPERATIVE PROCEDURE: Patient brought the operating room after undergoing a regional block she then underwent a general anesthetic. After adequate levels anesthesia obtained patient's right lower extremity was prepped using DuraPrep draped into a sterile field a timeout was called. Tension was first focused on the gastrocnemius muscle. A longitudinal incision was made medially 15 cm proximal to the distal tip of the medial malleolus. Blunt dissection was carried down to the gastroc fascia and the gastroc fascia was released. This allowed dorsiflexion from about 10 short of neutral to about 30 past neutral. There was significant degenerative changes of the gastroc muscle. The wound was irrigated with normal saline hemostasis was obtained and the incision was closed using 2-0 nylon. Attention was then focused on the second and third metatarsals. A dorsal incision was made in the second webspace blunt dissection was carried down to the second toe MTP joint. A Weil osteotomy was  performed metatarsal head was translated proximally overhanging bone was resected and this was stabilized with a 2 x 12 mm mini frag screw. Attention was then focused on the third metatarsal a Weil osteotomy was performed metatarsal head was translated proximally overhanging bone was resected  the Weil osteotomy was secured with a 2 x 12 mm mini frag screw. The wound was irrigated with normal saline incision closed using 2-0 nylon a sterile compressive dressing was applied patient was extubated taken to the PACU in stable condition plan for discharge to home.

## 2016-08-08 ENCOUNTER — Encounter (HOSPITAL_COMMUNITY): Payer: Self-pay | Admitting: Orthopedic Surgery

## 2016-08-13 DIAGNOSIS — M205X2 Other deformities of toe(s) (acquired), left foot: Secondary | ICD-10-CM

## 2016-08-13 DIAGNOSIS — M6701 Short Achilles tendon (acquired), right ankle: Secondary | ICD-10-CM

## 2016-08-19 ENCOUNTER — Encounter (INDEPENDENT_AMBULATORY_CARE_PROVIDER_SITE_OTHER): Payer: Self-pay | Admitting: Orthopedic Surgery

## 2016-08-19 ENCOUNTER — Ambulatory Visit (INDEPENDENT_AMBULATORY_CARE_PROVIDER_SITE_OTHER): Payer: Medicare Other | Admitting: Orthopedic Surgery

## 2016-08-19 VITALS — Ht 65.0 in | Wt 250.0 lb

## 2016-08-19 DIAGNOSIS — M7741 Metatarsalgia, right foot: Secondary | ICD-10-CM

## 2016-08-19 DIAGNOSIS — M6701 Short Achilles tendon (acquired), right ankle: Secondary | ICD-10-CM

## 2016-08-19 NOTE — Progress Notes (Signed)
Office Visit Note   Patient: Grace Delacruz           Date of Birth: May 28, 1946           MRN: [id number] Visit Date: 08/19/2016              Requested by: Rory Percy, MD [address] [place], [place] [address] PCP: Rory Percy, MD   Assessment & Plan: Visit Diagnoses:  1. Short Achilles tendon (acquired), right ankle   2. Metatarsalgia, right foot     Plan: Plan to follow-up in 4 weeks. Patient wants to consider intervention for her left foot.  Follow-Up Instructions: Return in about 4 weeks (around 09/16/2016).   Orders:  No orders of the defined types were placed in this encounter.  No orders of the defined types were placed in this encounter.     Procedures: No procedures performed   Clinical Data: No additional findings.   Subjective: Chief Complaint  Patient presents with  . Right Foot - Routine Post Op    Right weil osteotomy 2nd and 3rd MT and gastrco recession 08/07/16    Patient is s/p  Surgery 08/07/16 a right Weil osteotomy 2nd and 3rd MT and gastroc recession. Non weight bearing with knee scooter. Not taking anything for pain. The stitches are intact. The calf feels tight and swollen and slightly pink. There is a spot amount of serosanguinous drainage on the band aid at the calf. No other question or concerns.  No compressive garment is being worn.  Pt feels well.      Review of Systems   Objective: Vital Signs: Ht 5\' 5"  (1.651 m)   Wt 250 lb (113.4 kg)   BMI 41.60 kg/m   Physical Exam On examination incisions are healed quite nicely there is no redness no synovitis no signs of infection. The sutures are harvested.  Ortho Exam  Specialty Comments:  No specialty comments available.  Imaging: No results found.   PMFS History: Patient Active Problem List   Diagnosis Date Noted  . Claw toe, acquired, right   . Acquired contracture of Achilles tendon, right   . Abnormal chest x-ray 10/13/2014  . Snoring 10/04/2013  . Left bundle branch  block 08/24/2013  . Essential hypertension, benign 06/19/2012  . Type 2 diabetes mellitus ([place]) 06/19/2012   Past Medical History:  Diagnosis Date  . Anxiety   . Depression   . DJD (degenerative joint disease)   . Esophageal reflux   . Essential hypertension, benign   . Fibrocystic breast disease   . Hypothyroidism   . Left bundle branch block    Transient - possibly rate related   . Menopause   . Osteoarthritis   . PONV (postoperative nausea and vomiting)    pt. would like to have a scopolamine patch  . Restless leg syndrome   . Scoliosis   . Type 2 diabetes mellitus ([facility])     Family History  Problem Relation Age of Onset  . Heart failure Father     Died with pneumonia  . Hypertension Father   . Arthritis Father   . Diabetes Father   . Hypertension Mother   . Asthma Paternal Grandfather   . Emphysema Paternal Grandfather     Past Surgical History:  Procedure Laterality Date  . BREAST BIOPSY     lt x3  . CARPOMETACARPEL SUSPENSION PLASTY Left 03/08/2014   Procedure: LEFT THUMB CARPOMETACARPEL ([place]) SUSPENSION PLASTY WITH TRAPEZIUM EXCISION;  Surgeon: Youlanda Mighty  Luisa Dago., MD;  Location: [place];  Service: Orthopedics;  Laterality: Left;  . CATARACT EXTRACTION Bilateral   . CESAREAN SECTION    . CHOLECYSTECTOMY    . COLONOSCOPY  03/26/2007   RMR:  Diffuse changes of proctocolitis as described above diminutive rectal polyp, cold biopsy/removed, pedunculated polyp of the splenic flexure hot snare removed.  Segmental biopsies the colon taken of the ileocecal valve ulcer biopsied normal terminal ileum  . COLONOSCOPY N/A 01/05/2014   Procedure: COLONOSCOPY;  Surgeon: Rogene Houston, MD;  [facility];  Service: Endoscopy;  Laterality: N/A;  830  . GASTROCNEMIUS RECESSION Right 08/07/2016   Procedure: GASTROCNEMIUS RECESSION;  Surgeon: Newt Minion, MD;  Location: [place];  Service: Orthopedics;  Laterality: Right;  . Joint fusion-foot     x2 LEFT  FOOT  . KNEE ARTHROPLASTY  2001   Right  . KNEE ARTHROPLASTY  2002   Left  . Left leg vein resection    . ROTATOR CUFF REPAIR     LEFT  . THYROIDECTOMY  1980   3/4  . TONSILLECTOMY    . WEIL OSTEOTOMY Right 08/07/2016   Procedure: WEIL OSTEOTOMY 2nd and 3rd Metatarsal Right Foot;  Surgeon: Newt Minion, MD;  Location: [place];  Service: Orthopedics;  Laterality: Right;   Social History   Occupational History  . retired    Social History Main Topics  . Smoking status: Never Smoker  . Smokeless tobacco: Never Used  . Alcohol use 0.0 oz/week     Comment: rare-- glass of wine at christmas  . Drug use: No  . Sexual activity: Not on file

## 2016-09-11 ENCOUNTER — Ambulatory Visit (INDEPENDENT_AMBULATORY_CARE_PROVIDER_SITE_OTHER): Payer: Medicare Other

## 2016-09-11 ENCOUNTER — Encounter (INDEPENDENT_AMBULATORY_CARE_PROVIDER_SITE_OTHER): Payer: Self-pay | Admitting: Orthopedic Surgery

## 2016-09-11 ENCOUNTER — Ambulatory Visit (INDEPENDENT_AMBULATORY_CARE_PROVIDER_SITE_OTHER): Payer: Medicare Other | Admitting: Orthopedic Surgery

## 2016-09-11 VITALS — Ht 65.0 in | Wt 250.0 lb

## 2016-09-11 DIAGNOSIS — M79671 Pain in right foot: Secondary | ICD-10-CM

## 2016-09-11 DIAGNOSIS — M25572 Pain in left ankle and joints of left foot: Secondary | ICD-10-CM

## 2016-09-11 NOTE — Progress Notes (Signed)
Office Visit Note   Patient: Grace Delacruz           Date of Birth: 1946-04-13           MRN: [id number] Visit Date: 09/11/2016              Requested by: Rory Percy, MD [place], [place] [address] PCP: Rory Percy, MD   Assessment & Plan: Visit Diagnoses:  1. Pain in right foot   2. Pain in left ankle and joints of left foot     Plan: Patient states she has pain of the right foot after she lifts her foot up she still has a little bit of tightness in the Achilles gastrocnemius muscle. Patient was given instructions for heel cord stretching this was demonstrated to her. The scar of the gastrocnemius recession and the Weil osteotomy for the second and third metatarsals is healed quite nicely. Left foot patient has hyperextension of the second toe from a previous PIP fusion she has callus and ulcer on the plantar aspect. Patient like to proceed with surgical revision of the PIP fusion of the left foot second toe PIP joint we will set this up at her convenience.  Follow-Up Instructions: Return if symptoms worsen or fail to improve.   Orders:  Orders Placed This Encounter  Procedures  . XR Foot Complete Right   No orders of the defined types were placed in this encounter.     Procedures: No procedures performed   Clinical Data: No additional findings.   Subjective: Chief Complaint  Patient presents with  . Right Foot - Routine Post Op    08/07/16 Weil Osteotomy 2nd/3rd metatarsal right foot, with gastroc recession approximately 1 month post op.  . Left Foot - Pain    left foot with a bony spur plantarly, status post fusions    Patient presents today for bilateral foot evaluation. She is s/p weil osteotomy 2nd &3rd metatarsal with gastroc recession. She is approximately one month out. She still has pain with lifting foot up. Her incision is well healed. She is requesting repeat imaging today of right foot. She also would like to discuss surgical intervention of  left foot. She was evaluated initially back on 07/25/16 for left foot with a bony spur plantarly, status post fusions.    Review of Systems   Objective: Vital Signs: Ht 5\' 5"  (1.651 m)   Wt 250 lb (113.4 kg)   BMI 41.60 kg/m   Physical Exam on examination patient has a good dorsalis pedis pulse bilaterally. No redness no cellulitis in the right foot or ankle or calf. Left foot she has hyperextension of the second toe status post previous fusion in 2015. Ulcer and callus on the plantar aspect of the second toe.  Ortho Exam  Specialty Comments:  No specialty comments available.  Imaging: No results found.   PMFS History: Patient Active Problem List   Diagnosis Date Noted  . Claw toe, acquired, right   . Acquired contracture of Achilles tendon, right   . Abnormal chest x-ray 10/13/2014  . Snoring 10/04/2013  . Left bundle branch block 08/24/2013  . Essential hypertension, benign 06/19/2012  . Type 2 diabetes mellitus ([place]) 06/19/2012   Past Medical History:  Diagnosis Date  . Anxiety   . Depression   . DJD (degenerative joint disease)   . Esophageal reflux   . Essential hypertension, benign   . Fibrocystic breast disease   . Hypothyroidism   . Left  bundle branch block    Transient - possibly rate related   . Menopause   . Osteoarthritis   . PONV (postoperative nausea and vomiting)    pt. would like to have a scopolamine patch  . Restless leg syndrome   . Scoliosis   . Type 2 diabetes mellitus ([facility])     Family History  Problem Relation Age of Onset  . Heart failure Father     Died with pneumonia  . Hypertension Father   . Arthritis Father   . Diabetes Father   . Hypertension Mother   . Asthma Paternal Grandfather   . Emphysema Paternal Grandfather     Past Surgical History:  Procedure Laterality Date  . BREAST BIOPSY     lt x3  . CARPOMETACARPEL SUSPENSION PLASTY Left 03/08/2014   Procedure: LEFT THUMB CARPOMETACARPEL ([place]) SUSPENSION PLASTY WITH  TRAPEZIUM EXCISION;  Surgeon: [name]., MD;  Location: [facility];  Service: Orthopedics;  Laterality: Left;  . CATARACT EXTRACTION Bilateral   . CESAREAN SECTION    . CHOLECYSTECTOMY    . COLONOSCOPY  03/26/2007   RMR:  Diffuse changes of proctocolitis as described above diminutive rectal polyp, cold biopsy/removed, pedunculated polyp of the splenic flexure hot snare removed.  Segmental biopsies the colon taken of the ileocecal valve ulcer biopsied normal terminal ileum  . COLONOSCOPY N/A 01/05/2014   Procedure: COLONOSCOPY;  Surgeon: Rogene Houston, MD;  [facility];  Service: Endoscopy;  Laterality: N/A;  830  . GASTROCNEMIUS RECESSION Right 08/07/2016   Procedure: GASTROCNEMIUS RECESSION;  Surgeon: Newt Minion, MD;  Location: [facility];  Service: Orthopedics;  Laterality: Right;  . Joint fusion-foot     x2 LEFT FOOT  . KNEE ARTHROPLASTY  2001   Right  . KNEE ARTHROPLASTY  2002   Left  . Left leg vein resection    . ROTATOR CUFF REPAIR     LEFT  . THYROIDECTOMY  1980   3/4  . TONSILLECTOMY    . WEIL OSTEOTOMY Right 08/07/2016   Procedure: WEIL OSTEOTOMY 2nd and 3rd Metatarsal Right Foot;  Surgeon: Newt Minion, MD;  Location: [facility];  Service: Orthopedics;  Laterality: Right;   Social History   Occupational History  . retired    Social History Main Topics  . Smoking status: Never Smoker  . Smokeless tobacco: Never Used  . Alcohol use 0.0 oz/week     Comment: rare-- glass of wine at christmas  . Drug use: No  . Sexual activity: Not on file

## 2016-10-15 DIAGNOSIS — J069 Acute upper respiratory infection, unspecified: Secondary | ICD-10-CM | POA: Diagnosis not present

## 2016-10-15 DIAGNOSIS — J0100 Acute maxillary sinusitis, unspecified: Secondary | ICD-10-CM | POA: Diagnosis not present

## 2016-10-15 DIAGNOSIS — Z6841 Body Mass Index (BMI) 40.0 and over, adult: Secondary | ICD-10-CM | POA: Diagnosis not present

## 2016-10-28 DIAGNOSIS — L570 Actinic keratosis: Secondary | ICD-10-CM | POA: Diagnosis not present

## 2016-11-28 ENCOUNTER — Ambulatory Visit (INDEPENDENT_AMBULATORY_CARE_PROVIDER_SITE_OTHER): Payer: Medicare Other | Admitting: Orthopedic Surgery

## 2016-11-28 ENCOUNTER — Ambulatory Visit (INDEPENDENT_AMBULATORY_CARE_PROVIDER_SITE_OTHER): Payer: Medicare Other

## 2016-11-28 DIAGNOSIS — Q7022 Fused toes, left foot: Secondary | ICD-10-CM | POA: Diagnosis not present

## 2016-11-28 NOTE — Progress Notes (Signed)
Office Visit Note   Patient: Grace Delacruz           Date of Birth: [birth year]           MRN: [id number] Visit Date: 11/28/2016              Requested by: Rory Percy, MD [place], [place] [id number] PCP: Rory Percy, MD  Chief Complaint  Patient presents with  . Left Foot - Follow-up    HPI: At last visit pt had discussed having surgical correction to the left foot.  At last evaluation  Dictation states that the patient has hyperextension of the second toe from a previous PIP fusion she has callus and ulcer on the plantar aspect. Patient would like to proceed with surgical revision of the PIP fusion of the left foot second toe PIP joint. [name], RMA    Assessment & Plan: Visit Diagnoses:  1. Fused toes of left foot     Plan: Patient states she like to proceed with surgical intervention due to the callus ulceration of plantar aspect of the PIP joint of the second and third toes. Would plan for removal of the screws from the second third metatarsal with a Weil osteotomy for the second and third metatarsal to realign the cascade of the metatarsal heads. Plan to take down the fusion of the PIP joint of the second toe removed the callus plantarly realign the toe and pinning of the second toe. Risk and benefits were discussed including risk of the wound not healing need for additional surgery. Patient states she understands wish to proceed at this time. She will be off her foot for 2 weeks in a postoperative shoe for 2 weeks afterwards.  Follow-Up Instructions: Return in about 2 weeks (around 12/12/2016).   Ortho Exam Examination patient is alert oriented no adenopathy well-dressed normal affect normal respiratory effort she does have an antalgic gait she has good pulses. Examination she has hyperextension of the second and third toes status post claw toe surgeries. Radiographs of the left foot were obtained. She has painful ulcer and callus beneath the second toe PIP  joint and pain beneath the third metatarsal head to palpation. Patient does have pain to palpation beneath the second and third metatarsal with a persistent long second and third metatarsal.  Imaging: Xr Foot Complete Left  Result Date: 11/28/2016 Three-view radiographs of the left foot were obtained. Radiographs shows fusion of base of the fourth and fifth metatarsal fusion of the first metatarsal medial cuneiform as well as what appears to be a Weil osteotomy for the second and third metatarsals however the screws seemed to be oriented the opposite direction of normal Weil osteotomy and there is no shortening of the second and third metatarsals radiographs do show a long second and third metatarsal.   Orders:  Orders Placed This Encounter  Procedures  . XR Foot Complete Left   No orders of the defined types were placed in this encounter.    Procedures: No procedures performed  Clinical Data: No additional findings.  Subjective: Review of Systems  Objective: Vital Signs: There were no vitals taken for this visit.  Specialty Comments:  No specialty comments available.  PMFS History: Patient Active Problem List   Diagnosis Date Noted  . Fused toes of left foot 11/28/2016  . Claw toe, acquired, right   . Acquired contracture of Achilles tendon, right   . Abnormal chest x-ray 10/13/2014  . Snoring 10/04/2013  .  Left bundle branch block 08/24/2013  . Essential hypertension, benign 06/19/2012  . Type 2 diabetes mellitus ([place]) 06/19/2012   Past Medical History:  Diagnosis Date  . Anxiety   . Depression   . DJD (degenerative joint disease)   . Esophageal reflux   . Essential hypertension, benign   . Fibrocystic breast disease   . Hypothyroidism   . Left bundle branch block    Transient - possibly rate related   . Menopause   . Osteoarthritis   . PONV (postoperative nausea and vomiting)    pt. would like to have a scopolamine patch  . Restless leg syndrome   .  Scoliosis   . Type 2 diabetes mellitus ([facility])     Family History  Problem Relation Age of Onset  . Heart failure Father     Died with pneumonia  . Hypertension Father   . Arthritis Father   . Diabetes Father   . Hypertension Mother   . Asthma Paternal Grandfather   . Emphysema Paternal Grandfather     Past Surgical History:  Procedure Laterality Date  . BREAST BIOPSY     lt x3  . CARPOMETACARPEL SUSPENSION PLASTY Left 03/08/2014   Procedure: LEFT THUMB CARPOMETACARPEL ([place]) SUSPENSION PLASTY WITH TRAPEZIUM EXCISION;  Surgeon: [name]., MD;  Location: [facility];  Service: Orthopedics;  Laterality: Left;  . CATARACT EXTRACTION Bilateral   . CESAREAN SECTION    . CHOLECYSTECTOMY    . COLONOSCOPY  03/26/2007   RMR:  Diffuse changes of proctocolitis as described above diminutive rectal polyp, cold biopsy/removed, pedunculated polyp of the splenic flexure hot snare removed.  Segmental biopsies the colon taken of the ileocecal valve ulcer biopsied normal terminal ileum  . COLONOSCOPY N/A 01/05/2014   Procedure: COLONOSCOPY;  Surgeon: Rogene Houston, MD;  [facility];  Service: Endoscopy;  Laterality: N/A;  830  . GASTROCNEMIUS RECESSION Right 08/07/2016   Procedure: GASTROCNEMIUS RECESSION;  Surgeon: Newt Minion, MD;  Location: [facility];  Service: Orthopedics;  Laterality: Right;  . Joint fusion-foot     x2 LEFT FOOT  . KNEE ARTHROPLASTY  2001   Right  . KNEE ARTHROPLASTY  2002   Left  . Left leg vein resection    . ROTATOR CUFF REPAIR     LEFT  . THYROIDECTOMY  1980   3/4  . TONSILLECTOMY    . WEIL OSTEOTOMY Right 08/07/2016   Procedure: WEIL OSTEOTOMY 2nd and 3rd Metatarsal Right Foot;  Surgeon: Newt Minion, MD;  Location: [facility];  Service: Orthopedics;  Laterality: Right;   Social History   Occupational History  . retired    Social History Main Topics  . Smoking status: Never Smoker  . Smokeless tobacco: Never Used  . Alcohol use  0.0 oz/week     Comment: rare-- glass of wine at christmas  . Drug use: No  . Sexual activity: Not on file

## 2016-11-29 ENCOUNTER — Other Ambulatory Visit (INDEPENDENT_AMBULATORY_CARE_PROVIDER_SITE_OTHER): Payer: Self-pay | Admitting: Family

## 2016-12-11 DIAGNOSIS — M5136 Other intervertebral disc degeneration, lumbar region: Secondary | ICD-10-CM | POA: Diagnosis not present

## 2016-12-11 DIAGNOSIS — M41125 Adolescent idiopathic scoliosis, thoracolumbar region: Secondary | ICD-10-CM | POA: Diagnosis not present

## 2016-12-20 ENCOUNTER — Encounter (HOSPITAL_COMMUNITY)
Admission: RE | Admit: 2016-12-20 | Discharge: 2016-12-20 | Disposition: A | Discharge status: Home / Self Care | Payer: Medicare Other | Source: Ambulatory Visit | Attending: Orthopedic Surgery | Admitting: Orthopedic Surgery

## 2016-12-20 ENCOUNTER — Encounter (HOSPITAL_COMMUNITY): Payer: Self-pay

## 2016-12-20 DIAGNOSIS — Z981 Arthrodesis status: Secondary | ICD-10-CM | POA: Insufficient documentation

## 2016-12-20 DIAGNOSIS — Z01812 Encounter for preprocedural laboratory examination: Secondary | ICD-10-CM | POA: Insufficient documentation

## 2016-12-20 DIAGNOSIS — Z79899 Other long term (current) drug therapy: Secondary | ICD-10-CM | POA: Insufficient documentation

## 2016-12-20 DIAGNOSIS — Z7982 Long term (current) use of aspirin: Secondary | ICD-10-CM | POA: Insufficient documentation

## 2016-12-20 DIAGNOSIS — I10 Essential (primary) hypertension: Secondary | ICD-10-CM | POA: Diagnosis not present

## 2016-12-20 DIAGNOSIS — E039 Hypothyroidism, unspecified: Secondary | ICD-10-CM | POA: Insufficient documentation

## 2016-12-20 DIAGNOSIS — E119 Type 2 diabetes mellitus without complications: Secondary | ICD-10-CM | POA: Diagnosis not present

## 2016-12-20 DIAGNOSIS — Z7984 Long term (current) use of oral hypoglycemic drugs: Secondary | ICD-10-CM | POA: Insufficient documentation

## 2016-12-20 DIAGNOSIS — M205X2 Other deformities of toe(s) (acquired), left foot: Secondary | ICD-10-CM | POA: Insufficient documentation

## 2016-12-20 DIAGNOSIS — I447 Left bundle-branch block, unspecified: Secondary | ICD-10-CM | POA: Diagnosis not present

## 2016-12-20 DIAGNOSIS — Z01818 Encounter for other preprocedural examination: Secondary | ICD-10-CM | POA: Diagnosis not present

## 2016-12-20 LAB — CBC
HEMATOCRIT: 43.4 % (ref 36.0–46.0)
Hemoglobin: 14.4 g/dL (ref 12.0–15.0)
MCH: 30.2 pg (ref 26.0–34.0)
MCHC: 33.2 g/dL (ref 30.0–36.0)
MCV: 91 fL (ref 78.0–100.0)
PLATELETS: 243 10*3/uL (ref 150–400)
RBC: 4.77 MIL/uL (ref 3.87–5.11)
RDW: 13 % (ref 11.5–15.5)
WBC: 9.9 10*3/uL (ref 4.0–10.5)

## 2016-12-20 LAB — SURGICAL PCR SCREEN
MRSA, PCR: NEGATIVE
STAPHYLOCOCCUS AUREUS: NEGATIVE

## 2016-12-20 LAB — BASIC METABOLIC PANEL
ANION GAP: 9 (ref 5–15)
BUN: 20 mg/dL (ref 6–20)
CALCIUM: 9.4 mg/dL (ref 8.9–10.3)
CO2: 23 mmol/L (ref 22–32)
CREATININE: 1.12 mg/dL — AB (ref 0.44–1.00)
Chloride: 108 mmol/L (ref 101–111)
GFR, EST AFRICAN AMERICAN: 57 mL/min — AB (ref >60)
GFR, EST NON AFRICAN AMERICAN: 49 mL/min — AB (ref >60)
Glucose, Bld: 116 mg/dL — ABNORMAL HIGH (ref 65–99)
Potassium: 4.3 mmol/L (ref 3.5–5.1)
SODIUM: 140 mmol/L (ref 135–145)

## 2016-12-20 LAB — GLUCOSE, CAPILLARY: GLUCOSE-CAPILLARY: 123 mg/dL — AB (ref 65–99)

## 2016-12-20 NOTE — Pre-Procedure Instructions (Addendum)
TELISA CHASTAIN  12/20/2016      [facility] [address], [place] [address] Phone: [phone] Fax: [phone]    Your procedure is scheduled on Wednesday, March 7th   Report to [facility] Admitting at 6:30 AM             (posted surgery time 8:30 - 9:19 am)   Call this number if you have problems the [facility] of surgery:  [phone], or call [phone], Mon - Fri from 8:00 am - 4:00 pm)   Remember:  Do not eat food or drink liquids after midnight, Tuesday.   Take these medicines the morning of surgery with A SIP OF WATER : Norvasc, Prozac, Levothyroxine, Metoprolol, Omeprazole.              4-5 days prior to surgery, STOP taking any Vitamins, Herbal Supplements, Anti-inflammatories.   Do not wear jewelry, make-up or nail polish.  Do not wear lotions, powders, perfumes, or deoderant.  Do not shave underarms & legs 48 hours prior to surgery.     Do not bring valuables to the hospital.  [facility] is not responsible for any belongings or valuables.  Contacts, dentures or bridgework may not be worn into surgery.  Leave your suitcase in the car.  After surgery it may be brought to your room.  Patients discharged the day of surgery will not be allowed to drive home, and you will need someone to stay with you for the first 24 hrs after surgery.  Please read over the following fact sheets that you were given. Pain Booklet, MRSA Information and Surgical Site Infection Prevention            How to Manage Your Diabetes Before and After Surgery  Why is it important to control my blood sugar before and after surgery? . Improving blood sugar levels before and after surgery helps healing and can limit problems. . A way of improving blood sugar control is eating a healthy diet by: o  Eating less sugar and carbohydrates o  Increasing activity/exercise o  Talking with your doctor about reaching your blood sugar  goals . High blood sugars (greater than 180 mg/dL) can raise your risk of infections and slow your recovery, so you will need to focus on controlling your diabetes during the weeks before surgery. . Make sure that the doctor who takes care of your diabetes knows about your planned surgery including the date and location.  How do I manage my blood sugar before surgery? . Check your blood sugar at least 4 times a day, starting 2 days before surgery, to make sure that the level is not too high or low. o Check your blood sugar the morning of your surgery when you wake up and every 2 hours until you get to the Short Stay unit. o  . If your blood sugar is less than 70 mg/dL, you will need to treat for low blood sugar: o Do not take insulin. o Treat a low blood sugar (less than 70 mg/dL) with  cup of clear juice (cranberry or apple), 4 glucose tablets, OR glucose gel. o DO NOT drink orange juice. o  o Recheck blood sugar in 15 minutes after treatment (to make sure it is greater than 70 mg/dL). If your blood sugar is not greater than 70 mg/dL on recheck, call [phone] for further instructions. . Report your blood sugar to  the short stay nurse when you get to Short Stay.  . If you are admitted to the hospital after surgery: o Your blood sugar will be checked by the staff and you will probably be given insulin after surgery (instead of oral diabetes medicines) to make sure you have good blood sugar levels. o The goal for blood sugar control after surgery is 80-180 mg/dL.   WHAT DO I DO ABOUT MY DIABETES MEDICATION?   Marland Kitchen Do not take oral diabetes medicines (pills) the morning of surgery.  Other Instructions:          Patient Signature:  Date:   Nurse Signature:  Date:   Reviewed and Endorsed by [facility] Patient Education Committee, August 2015

## 2016-12-20 NOTE — Progress Notes (Signed)
Cardio is Dr. Myles Gip   LOV 07/2016 .  Denies any chest pains, sob. PCP is Dr. Doristine Johns in [place] 07/2016 He manages her diabetes. Am sugars run 152-175. Last A1C was back in November--getting sample today.

## 2016-12-21 LAB — HEMOGLOBIN A1C
HEMOGLOBIN A1C: 6.6 % — AB (ref 4.8–5.6)
Mean Plasma Glucose: 143 mg/dL

## 2016-12-23 NOTE — Progress Notes (Signed)
Anesthesia Chart Review:  Pt is a 71 year old female scheduled for L 2nd toe PIP resection, L 2nd and 3rd metatarsal Weil osteotomy on 12/25/2016 with Meridee Score, MD.   - Cardiologist is Rozann Lesches, MD, last office visit 02/14/16; 1 year f/u recommended - PCP is Rory Percy, MD.   PMH includes:  HTN, LBBB (intermittent, thought to be rate related), DM, hypothyroidism, scoliosis, post-op N/V. Never smoker. BMI 44. S/p Weil osteotomy R 2nd/3rd metatarsal 08/07/16.  S/p L tarsal/metatarsal fusion with weil osteotomy 07/01/12  Medications include: amlodipine, ASA 81mg , lasix, levothyroxine, losartan, metformin, metoprolol, prilosec, simvastatin, spironolactone  Preoperative labs reviewed.  HbA1c 6.6, glucose 116.   EKG 02/14/16: Sinus  Rhythm. Poor R-wave progression. Negative precordial T-waves.  Low voltage.   Nuclear stress test 06/15/12: Normal stress nuclear study.  LV Ejection Fraction: 63%.  LV Wall Motion:  NL LV Function; NL Wall Motion  Echo 06/12/12:  - Left ventricle: The cavity size was normal. Wall thickness was increased in a pattern of moderate LVH. Systolic function was normal. The estimated ejection fraction was in the range of 55% to 65%. Wall motion was normal; there were no regional wall motion abnormalities. Doppler parameters are consistent with abnormal left ventricular relaxation (grade 1 diastolic dysfunction). - Ventricular septum: Septal motion showed paradox. - Left atrium: The atrium was mildly dilated. - Atrial septum: No defect or patent foramen ovale was identified.  If no changes, I anticipate pt can proceed with surgery as scheduled.   Willeen Cass, FNP-BC [facility]/Anesthesiology Phone: [phone] 12/23/2016 11:59 AM

## 2016-12-24 MED ORDER — CLINDAMYCIN PHOSPHATE 900 MG/50ML IV SOLN
900.0000 mg | INTRAVENOUS | Status: AC
  Administered 2016-12-25: 900 mg via INTRAVENOUS
  Filled 2016-12-24: qty 50

## 2016-12-24 NOTE — Anesthesia Preprocedure Evaluation (Addendum)
Anesthesia Evaluation  Patient identified by MRN, date of birth, ID band Patient awake    Reviewed: Allergy & Precautions, NPO status , Patient's Chart, lab work & pertinent test results  History of Anesthesia Complications (+) PONV and history of anesthetic complications  Airway Mallampati: III  TM Distance: >3 FB Neck ROM: Full    Dental no notable dental hx. (+) Dental Advisory Given   Pulmonary neg pulmonary ROS,    Pulmonary exam normal        Cardiovascular hypertension, Pt. on medications and Pt. on home beta blockers Normal cardiovascular exam+ dysrhythmias (intermittent LBBB)   Echocardiogram August 2013: Moderate LVH with LVEF of [id number], grade 1 diastolic dysfunction, paradoxical septal motion, mild left atrial enlargement. No major abnormalities.   [place] 2013: No significant ST segment changes and overall normal perfusion, no clear evidence of ischemia.   Neuro/Psych Anxiety Depression negative neurological ROS     GI/Hepatic GERD  ,  Endo/Other  diabetes, Type 2, Oral Hypoglycemic AgentsHypothyroidism Morbid obesity  Renal/GU Renal InsufficiencyRenal disease     Musculoskeletal  (+) Arthritis ,   Abdominal   Peds  Hematology negative hematology ROS (+)   Anesthesia Other Findings   Reproductive/Obstetrics                            Lab Results  Component Value Date   WBC 9.9 12/20/2016   HGB 14.4 12/20/2016   HCT 43.4 12/20/2016   MCV 91.0 12/20/2016   PLT 243 12/20/2016   Lab Results  Component Value Date   CREATININE 1.12 (H) 12/20/2016   BUN 20 12/20/2016   NA 140 12/20/2016   K 4.3 12/20/2016   CL 108 12/20/2016   CO2 23 12/20/2016    Anesthesia Physical  Anesthesia Plan  ASA: III  Anesthesia Plan: General and Regional   Post-op Pain Management:  Regional for Post-op pain   Induction: Intravenous  Airway Management Planned: Oral ETT and  LMA  Additional Equipment:   Intra-op Plan:   Post-operative Plan: Extubation in OR  Informed Consent: I have reviewed the patients History and Physical, chart, labs and discussed the procedure including the risks, benefits and alternatives for the proposed anesthesia with the patient or authorized representative who has indicated his/her understanding and acceptance.   Dental advisory given  Plan Discussed with: CRNA, Anesthesiologist and Surgeon  Anesthesia Plan Comments:        Anesthesia Quick Evaluation

## 2016-12-25 ENCOUNTER — Encounter (HOSPITAL_COMMUNITY): Payer: Self-pay | Admitting: *Deleted

## 2016-12-25 ENCOUNTER — Encounter (HOSPITAL_COMMUNITY)
Admission: RE | Disposition: A | Discharge status: Home / Self Care | Payer: Self-pay | Source: Ambulatory Visit | Attending: Orthopedic Surgery

## 2016-12-25 ENCOUNTER — Ambulatory Visit (HOSPITAL_COMMUNITY): Payer: Medicare Other | Admitting: Emergency Medicine

## 2016-12-25 ENCOUNTER — Ambulatory Visit (HOSPITAL_COMMUNITY): Payer: Medicare Other | Admitting: Anesthesiology

## 2016-12-25 ENCOUNTER — Ambulatory Visit (HOSPITAL_COMMUNITY)
Admission: RE | Admit: 2016-12-25 | Discharge: 2016-12-25 | Disposition: A | Discharge status: Home / Self Care | Payer: Medicare Other | Source: Ambulatory Visit | Attending: Orthopedic Surgery | Admitting: Orthopedic Surgery

## 2016-12-25 DIAGNOSIS — Z7982 Long term (current) use of aspirin: Secondary | ICD-10-CM | POA: Diagnosis not present

## 2016-12-25 DIAGNOSIS — T8489XA Other specified complication of internal orthopedic prosthetic devices, implants and grafts, initial encounter: Secondary | ICD-10-CM | POA: Insufficient documentation

## 2016-12-25 DIAGNOSIS — Q7022 Fused toes, left foot: Secondary | ICD-10-CM

## 2016-12-25 DIAGNOSIS — F329 Major depressive disorder, single episode, unspecified: Secondary | ICD-10-CM | POA: Insufficient documentation

## 2016-12-25 DIAGNOSIS — E119 Type 2 diabetes mellitus without complications: Secondary | ICD-10-CM | POA: Insufficient documentation

## 2016-12-25 DIAGNOSIS — F419 Anxiety disorder, unspecified: Secondary | ICD-10-CM | POA: Insufficient documentation

## 2016-12-25 DIAGNOSIS — G8918 Other acute postprocedural pain: Secondary | ICD-10-CM | POA: Diagnosis not present

## 2016-12-25 DIAGNOSIS — Y831 Surgical operation with implant of artificial internal device as the cause of abnormal reaction of the patient, or of later complication, without mention of misadventure at the time of the procedure: Secondary | ICD-10-CM | POA: Diagnosis not present

## 2016-12-25 DIAGNOSIS — E039 Hypothyroidism, unspecified: Secondary | ICD-10-CM | POA: Diagnosis not present

## 2016-12-25 DIAGNOSIS — Z7984 Long term (current) use of oral hypoglycemic drugs: Secondary | ICD-10-CM | POA: Insufficient documentation

## 2016-12-25 DIAGNOSIS — Z6841 Body Mass Index (BMI) 40.0 and over, adult: Secondary | ICD-10-CM | POA: Insufficient documentation

## 2016-12-25 DIAGNOSIS — K219 Gastro-esophageal reflux disease without esophagitis: Secondary | ICD-10-CM | POA: Diagnosis not present

## 2016-12-25 DIAGNOSIS — I10 Essential (primary) hypertension: Secondary | ICD-10-CM | POA: Diagnosis not present

## 2016-12-25 DIAGNOSIS — I447 Left bundle-branch block, unspecified: Secondary | ICD-10-CM | POA: Insufficient documentation

## 2016-12-25 DIAGNOSIS — Z969 Presence of functional implant, unspecified: Secondary | ICD-10-CM | POA: Diagnosis not present

## 2016-12-25 DIAGNOSIS — Z79899 Other long term (current) drug therapy: Secondary | ICD-10-CM | POA: Diagnosis not present

## 2016-12-25 DIAGNOSIS — M205X2 Other deformities of toe(s) (acquired), left foot: Secondary | ICD-10-CM

## 2016-12-25 HISTORY — PX: WEIL OSTEOTOMY: SHX5044

## 2016-12-25 LAB — GLUCOSE, CAPILLARY
GLUCOSE-CAPILLARY: 135 mg/dL — AB (ref 65–99)
Glucose-Capillary: 152 mg/dL — ABNORMAL HIGH (ref 65–99)

## 2016-12-25 SURGERY — OSTEOTOMY, WEIL
Anesthesia: Regional | Laterality: Left

## 2016-12-25 MED ORDER — MIDAZOLAM HCL 2 MG/2ML IJ SOLN
INTRAMUSCULAR | Status: AC
  Filled 2016-12-25: qty 2

## 2016-12-25 MED ORDER — MIDAZOLAM HCL 5 MG/5ML IJ SOLN
INTRAMUSCULAR | Status: DC | PRN
  Administered 2016-12-25: 2 mg via INTRAVENOUS

## 2016-12-25 MED ORDER — GLYCOPYRROLATE 0.2 MG/ML IJ SOLN
INTRAMUSCULAR | Status: DC | PRN
  Administered 2016-12-25 (×2): 0.2 mg via INTRAVENOUS

## 2016-12-25 MED ORDER — PROPOFOL 500 MG/50ML IV EMUL
INTRAVENOUS | Status: DC | PRN
  Administered 2016-12-25: 20 ug/kg/min via INTRAVENOUS

## 2016-12-25 MED ORDER — METOCLOPRAMIDE HCL 5 MG/ML IJ SOLN
INTRAMUSCULAR | Status: DC | PRN
  Administered 2016-12-25: 10 mg via INTRAVENOUS

## 2016-12-25 MED ORDER — LIDOCAINE 2% (20 MG/ML) 5 ML SYRINGE
INTRAMUSCULAR | Status: DC | PRN
  Administered 2016-12-25: 100 mg via INTRAVENOUS

## 2016-12-25 MED ORDER — PROPOFOL 10 MG/ML IV BOLUS
INTRAVENOUS | Status: AC
  Filled 2016-12-25: qty 20

## 2016-12-25 MED ORDER — SCOPOLAMINE 1 MG/3DAYS TD PT72
1.0000 | MEDICATED_PATCH | TRANSDERMAL | Status: DC
  Administered 2016-12-25: 1.5 mg via TRANSDERMAL
  Filled 2016-12-25: qty 1

## 2016-12-25 MED ORDER — PHENYLEPHRINE HCL 10 MG/ML IJ SOLN
INTRAMUSCULAR | Status: DC | PRN
  Administered 2016-12-25 (×2): 80 ug via INTRAVENOUS
  Administered 2016-12-25 (×2): 120 ug via INTRAVENOUS

## 2016-12-25 MED ORDER — PROMETHAZINE HCL 25 MG/ML IJ SOLN: 6.2500 mg | INTRAMUSCULAR | Status: DC | PRN

## 2016-12-25 MED ORDER — FENTANYL CITRATE (PF) 100 MCG/2ML IJ SOLN
INTRAMUSCULAR | Status: DC | PRN
  Administered 2016-12-25: 25 ug via INTRAVENOUS

## 2016-12-25 MED ORDER — LACTATED RINGERS IV SOLN
INTRAVENOUS | Status: DC | PRN
  Administered 2016-12-25 (×2): via INTRAVENOUS

## 2016-12-25 MED ORDER — ONDANSETRON HCL 4 MG/2ML IJ SOLN
INTRAMUSCULAR | Status: AC
  Filled 2016-12-25: qty 2

## 2016-12-25 MED ORDER — LIDOCAINE 2% (20 MG/ML) 5 ML SYRINGE
INTRAMUSCULAR | Status: AC
  Filled 2016-12-25: qty 5

## 2016-12-25 MED ORDER — POVIDONE-IODINE 10 % EX SWAB: 2.0000 "application " | Freq: Once | CUTANEOUS | Status: DC

## 2016-12-25 MED ORDER — PHENYLEPHRINE 40 MCG/ML (10ML) SYRINGE FOR IV PUSH (FOR BLOOD PRESSURE SUPPORT)
PREFILLED_SYRINGE | INTRAVENOUS | Status: AC
  Filled 2016-12-25: qty 10

## 2016-12-25 MED ORDER — BUPIVACAINE-EPINEPHRINE (PF) 0.5% -1:200000 IJ SOLN
INTRAMUSCULAR | Status: DC | PRN
  Administered 2016-12-25: 30 mL via PERINEURAL

## 2016-12-25 MED ORDER — METOCLOPRAMIDE HCL 5 MG/ML IJ SOLN
INTRAMUSCULAR | Status: AC
  Filled 2016-12-25: qty 2

## 2016-12-25 MED ORDER — HYDROMORPHONE HCL 1 MG/ML IJ SOLN: 0.2500 mg | INTRAMUSCULAR | Status: DC | PRN

## 2016-12-25 MED ORDER — 0.9 % SODIUM CHLORIDE (POUR BTL) OPTIME
TOPICAL | Status: DC | PRN
  Administered 2016-12-25: 1000 mL

## 2016-12-25 MED ORDER — HYDROCODONE-ACETAMINOPHEN 5-325 MG PO TABS: 1.0000 | ORAL_TABLET | Freq: Four times a day (QID) | ORAL | 0 refills | Status: DC | PRN

## 2016-12-25 MED ORDER — ONDANSETRON HCL 4 MG/2ML IJ SOLN
INTRAMUSCULAR | Status: DC | PRN
  Administered 2016-12-25: 4 mg via INTRAVENOUS

## 2016-12-25 MED ORDER — FENTANYL CITRATE (PF) 100 MCG/2ML IJ SOLN
INTRAMUSCULAR | Status: AC
  Filled 2016-12-25: qty 2

## 2016-12-25 MED ORDER — PROPOFOL 10 MG/ML IV BOLUS
INTRAVENOUS | Status: DC | PRN
  Administered 2016-12-25: 200 mg via INTRAVENOUS

## 2016-12-25 SURGICAL SUPPLY — 55 items
BIT DRILL 110MM 85MM (BIT) IMPLANT
BLADE AVERAGE 25MMX9MM (BLADE) ×1
BLADE AVERAGE 25X9 (BLADE) ×1 IMPLANT
BLADE MINI RND TIP GREEN BEAV (BLADE) IMPLANT
BNDG CMPR 9X4 STRL LF SNTH (GAUZE/BANDAGES/DRESSINGS) ×1
BNDG COHESIVE 1X5 TAN STRL LF (GAUZE/BANDAGES/DRESSINGS) IMPLANT
BNDG COHESIVE 6X5 TAN STRL LF (GAUZE/BANDAGES/DRESSINGS) IMPLANT
BNDG ESMARK 4X9 LF (GAUZE/BANDAGES/DRESSINGS) ×3 IMPLANT
BNDG GAUZE STRTCH 6 (GAUZE/BANDAGES/DRESSINGS) IMPLANT
CORDS BIPOLAR (ELECTRODE) ×1 IMPLANT
COTTON STERILE ROLL (GAUZE/BANDAGES/DRESSINGS) IMPLANT
COVER SURGICAL LIGHT HANDLE (MISCELLANEOUS) ×4 IMPLANT
CUFF TOURNIQUET SINGLE 18IN (TOURNIQUET CUFF) IMPLANT
CUFF TOURNIQUET SINGLE 24IN (TOURNIQUET CUFF) IMPLANT
DRAPE OEC MINIVIEW 54X84 (DRAPES) IMPLANT
DRAPE U-SHAPE 47X51 STRL (DRAPES) ×3 IMPLANT
DRILL BIT 110MM/85MM (BIT) ×2
DRSG ADAPTIC 3X8 NADH LF (GAUZE/BANDAGES/DRESSINGS) IMPLANT
DURAPREP 26ML APPLICATOR (WOUND CARE) ×3 IMPLANT
ELECT REM PT RETURN 9FT ADLT (ELECTROSURGICAL) ×3
ELECTRODE REM PT RTRN 9FT ADLT (ELECTROSURGICAL) ×1 IMPLANT
GAUZE SPONGE 4X4 12PLY STRL (GAUZE/BANDAGES/DRESSINGS) IMPLANT
GLOVE BIOGEL PI IND STRL 9 (GLOVE) ×1 IMPLANT
GLOVE BIOGEL PI INDICATOR 9 (GLOVE) ×2
GLOVE SURG ORTHO 9.0 STRL STRW (GLOVE) ×3 IMPLANT
GOWN STRL REUS W/ TWL XL LVL3 (GOWN DISPOSABLE) ×2 IMPLANT
GOWN STRL REUS W/TWL XL LVL3 (GOWN DISPOSABLE) ×6
GUIDEWIRE ORTH 6X062XTROC NS (WIRE) IMPLANT
K-WIRE .062 (WIRE) ×6
K-WIRE 1.25 TRCR POINT 150 (WIRE) ×3
K-WIRE 1.6X150 (WIRE) ×6
KIT BASIN OR (CUSTOM PROCEDURE TRAY) ×3 IMPLANT
KIT ROOM TURNOVER OR (KITS) ×3 IMPLANT
KWIRE 1.25 TRCR POINT 150 (WIRE) IMPLANT
KWIRE 1.6X150 (WIRE) IMPLANT
MANIFOLD NEPTUNE II (INSTRUMENTS) ×3 IMPLANT
NDL HYPO 25GX1X1/2 BEV (NEEDLE) IMPLANT
NEEDLE HYPO 25GX1X1/2 BEV (NEEDLE) IMPLANT
NS IRRIG 1000ML POUR BTL (IV SOLUTION) ×3 IMPLANT
PACK ORTHO EXTREMITY (CUSTOM PROCEDURE TRAY) ×3 IMPLANT
PAD ARMBOARD 7.5X6 YLW CONV (MISCELLANEOUS) ×6 IMPLANT
PAD CAST 4YDX4 CTTN HI CHSV (CAST SUPPLIES) IMPLANT
PADDING CAST COTTON 4X4 STRL (CAST SUPPLIES)
SCREW CORTEX ST 2.0X12 (Screw) ×4 IMPLANT
SPECIMEN JAR SMALL (MISCELLANEOUS) ×3 IMPLANT
SUCTION FRAZIER HANDLE 10FR (MISCELLANEOUS)
SUCTION TUBE FRAZIER 10FR DISP (MISCELLANEOUS) IMPLANT
SUT ETHILON 2 0 FS 18 (SUTURE) IMPLANT
SUT VIC AB 2-0 FS1 27 (SUTURE) IMPLANT
SYR CONTROL 10ML LL (SYRINGE) IMPLANT
TOWEL OR 17X24 6PK STRL BLUE (TOWEL DISPOSABLE) ×1 IMPLANT
TOWEL OR 17X26 10 PK STRL BLUE (TOWEL DISPOSABLE) ×1 IMPLANT
TUBE CONNECTING 12'X1/4 (SUCTIONS)
TUBE CONNECTING 12X1/4 (SUCTIONS) IMPLANT
WATER STERILE IRR 1000ML POUR (IV SOLUTION) ×1 IMPLANT

## 2016-12-25 NOTE — H&P (Signed)
Grace Delacruz is an 71 y.o. female.   Chief Complaint: Painful callus beneath the second third metatarsal heads left foot with painful fusion left foot second toe HPI: Patient is a 71 year old woman status post claw toe surgery with persistent pain. Patient is failed conservative pressure unloading with shoe wear.  Past Medical History:  Diagnosis Date  . Anxiety   . Depression   . DJD (degenerative joint disease)   . Esophageal reflux   . Essential hypertension, benign   . Fibrocystic breast disease   . Hypothyroidism   . Left bundle branch block    Transient - possibly rate related   . Menopause   . Osteoarthritis   . PONV (postoperative nausea and vomiting)    pt. would like to have a scopolamine patch  . Restless leg syndrome   . Scoliosis   . Type 2 diabetes mellitus ([place])     Past Surgical History:  Procedure Laterality Date  . BREAST BIOPSY     lt x3  . CARPOMETACARPEL SUSPENSION PLASTY Left 03/08/2014   Procedure: LEFT THUMB CARPOMETACARPEL ([place]) SUSPENSION PLASTY WITH TRAPEZIUM EXCISION;  Surgeon: [name]., MD;  Location: [facility];  Service: Orthopedics;  Laterality: Left;  . CATARACT EXTRACTION Bilateral   . CESAREAN SECTION    . CHOLECYSTECTOMY    . COLONOSCOPY  03/26/2007   RMR:  Diffuse changes of proctocolitis as described above diminutive rectal polyp, cold biopsy/removed, pedunculated polyp of the splenic flexure hot snare removed.  Segmental biopsies the colon taken of the ileocecal valve ulcer biopsied normal terminal ileum  . COLONOSCOPY N/A 01/05/2014   Procedure: COLONOSCOPY;  Surgeon: Rogene Houston, MD;  [facility];  Service: Endoscopy;  Laterality: N/A;  830  . EYE SURGERY    . GASTROCNEMIUS RECESSION Right 08/07/2016   Procedure: GASTROCNEMIUS RECESSION;  Surgeon: Newt Minion, MD;  Location: [facility];  Service: Orthopedics;  Laterality: Right;  . Joint fusion-foot     x2 LEFT FOOT  . KNEE ARTHROPLASTY  2001    Right  . KNEE ARTHROPLASTY  2002   Left  . Left leg vein resection    . ROTATOR CUFF REPAIR     LEFT  . THYROIDECTOMY  1980   3/4  . TONSILLECTOMY    . TUBAL LIGATION    . WEIL OSTEOTOMY Right 08/07/2016   Procedure: WEIL OSTEOTOMY 2nd and 3rd Metatarsal Right Foot;  Surgeon: Newt Minion, MD;  Location: [facility];  Service: Orthopedics;  Laterality: Right;    Family History  Problem Relation Age of Onset  . Heart failure Father     Died with pneumonia  . Hypertension Father   . Arthritis Father   . Diabetes Father   . Hypertension Mother   . Asthma Paternal Grandfather   . Emphysema Paternal Grandfather    Social History:  reports that she has never smoked. She has never used smokeless tobacco. She reports that she drinks alcohol. She reports that she does not use drugs.  Allergies:  Allergies  Allergen Reactions  . Ephedrine Palpitations    Heart rate fast-  . Hibiclens [Chlorhexidine Gluconate] Rash    SKIN REDNESS AND BURNING SENSATION    NO CHG  . Keflex [Cephalexin] Itching and Rash    Medications Prior to Admission  Medication Sig Dispense Refill  . amLODipine (NORVASC) 10 MG tablet Take 10 mg by mouth every morning.     Marland Kitchen aspirin EC 81 MG tablet  Take 81 mg by mouth every evening.    . docusate sodium (COLACE) 100 MG capsule Take 100 mg by mouth at bedtime.    Marland Kitchen FLUoxetine (PROZAC) 20 MG capsule Take 20 mg by mouth every morning.     . furosemide (LASIX) 40 MG tablet Take 40 mg by mouth daily.    Marland Kitchen ibuprofen (ADVIL,MOTRIN) 200 MG tablet Take 400 mg by mouth at bedtime.     Javier Docker Oil 350 MG CAPS Take 350 mg by mouth daily.     Marland Kitchen levothyroxine (SYNTHROID, LEVOTHROID) 100 MCG tablet Take 100 mcg by mouth daily before breakfast.     . loratadine (CLARITIN) 10 MG tablet Take 10 mg by mouth daily as needed for allergies.     Marland Kitchen losartan (COZAAR) 100 MG tablet Take 1 tablet (100 mg total) by mouth every evening.    . meclizine (ANTIVERT) 25 MG tablet Take 25 mg by mouth  3 (three) times daily as needed for dizziness.     . metFORMIN (GLUCOPHAGE-XR) 500 MG 24 hr tablet Take 500 mg by mouth daily.    . metoprolol succinate (TOPROL-XL) 100 MG 24 hr tablet Take 100 mg by mouth every morning. Take with or immediately following a meal.    . omeprazole (PRILOSEC) 20 MG capsule Take 20 mg by mouth daily before breakfast.    . simvastatin (ZOCOR) 40 MG tablet Take 40 mg by mouth at bedtime.    Marland Kitchen spironolactone (ALDACTONE) 25 MG tablet Take 25 mg by mouth daily.    Marland Kitchen triamcinolone cream (KENALOG) 0.5 % Apply 1 application topically 3 (three) times daily as needed. For rash/irritation.    . Vitamin D, Cholecalciferol, 1000 UNITS CAPS Take 1,000 mg by mouth 2 (two) times daily.    Marland Kitchen HYDROcodone-acetaminophen (NORCO) 5-325 MG tablet Take 1 tablet by mouth every 4 (four) hours as needed. (Patient not taking: Reported on 09/11/2016) 60 tablet 0    No results found for this or any previous visit (from the past 48 hour(s)). No results found.  Review of Systems  All other systems reviewed and are negative.   There were no vitals taken for this visit. Physical Exam  Patient is alert oriented no adenopathy well-dressed normal affect normal respiratory effort.  Patient has an antalgic gait. Examination is good pulses. She has callus beneath the second and third metatarsal heads and callus and ulcer beneath the hyperextended second toe PIP joint left foot Assessment/Plan Assessment: Long second and third metatarsals status post Weil osteotomies and status post fusion the second toe.  Plan: We'll plan for removal of deep retained hardware for the second and third metatarsals revision Weil osteotomy second third metatarsals with resection of the PIP fusion of the second toe left foot and revision fusion. Risks and benefits were discussed including persistent pain infection need for additional surgery. Patient states she understands wish to proceed at this time.  Newt Minion,  MD 12/25/2016, 6:48 AM

## 2016-12-25 NOTE — Anesthesia Postprocedure Evaluation (Addendum)
Anesthesia Post Note  Patient: Grace Delacruz  Procedure(s) Performed: Procedure(s) (LRB): 2nd Toe Proximal Interphalangeal Resection, Weil Osteotomy 2nd and 3rd Metatarsal Left Foot (Left)  Patient location during evaluation: PACU Anesthesia Type: Regional Level of consciousness: sedated Pain management: pain level controlled Vital Signs Assessment: post-procedure vital signs reviewed and stable Respiratory status: spontaneous breathing and respiratory function stable Cardiovascular status: stable Anesthetic complications: no       Last Vitals:  Vitals:   12/25/16 1000 12/25/16 1006  BP:  (!) 115/55  Pulse: 73 71  Resp: 17   Temp: 36.2 C     Last Pain:  Vitals:   12/25/16 1000  TempSrc:   PainSc: 0-No pain                 Mayda Shippee DANIEL

## 2016-12-25 NOTE — Anesthesia Procedure Notes (Addendum)
Anesthesia Regional Block: Popliteal block   Pre-Anesthetic Checklist: ,, timeout performed, Correct Patient, Correct Site, Correct Laterality, Correct Procedure, Correct Position, site marked, Risks and benefits discussed,  Surgical consent,  Pre-op evaluation,  At surgeon's request and post-op pain management  Laterality: Left  Prep: chloraprep       Needles:  Injection technique: Single-shot  Needle Type: Echogenic Stimulator Needle          Additional Needles:   Procedures: ultrasound guided, nerve stimulator,,,,,,   Nerve Stimulator or Paresthesia:  Response: plantar flexion, 0.45 mA,   Additional Responses:   Narrative:  Start time: 12/25/2016 8:08 AM End time: 12/25/2016 8:16 AM Injection made incrementally with aspirations every 5 mL.  Performed by: Personally  Anesthesiologist: Duane Boston  Additional Notes: A functioning IV was confirmed and monitors were applied.  Sterile prep and drape, hand hygiene and sterile gloves were used.  Negative aspiration and test dose prior to incremental administration of local anesthetic. The patient tolerated the procedure well.Ultrasound  guidance: relevant anatomy identified, needle position confirmed, local anesthetic spread visualized around nerve(s), vascular puncture avoided.  Image printed for medical record.

## 2016-12-25 NOTE — Op Note (Signed)
12/25/2016  9:16 AM  PATIENT:  Grace Delacruz    PRE-OPERATIVE DIAGNOSIS:  Claw Toes Left 2nd and 3rd, retained hardware with extension fusion of the PIP joint second toe  POST-OPERATIVE DIAGNOSIS:  Same  PROCEDURE:  2nd Toe Proximal Interphalangeal Resection of malunion and fusion,  Weil Osteotomy 2nd and 3rd Metatarsal Left Foot Removal of deep retained hardware 2 screws  SURGEON:  Newt Minion, MD  PHYSICIAN ASSISTANT:None ANESTHESIA:   General  PREOPERATIVE INDICATIONS:  Grace Delacruz is a  71 y.o. female with a diagnosis of Claw Toes Left 2nd and 3rd who failed conservative measures and elected for surgical management.    The risks benefits and alternatives were discussed with the patient preoperatively including but not limited to the risks of infection, bleeding, nerve injury, cardiopulmonary complications, the need for revision surgery, among others, and the patient was willing to proceed.  OPERATIVE IMPLANTS: 2 x 12 mm mini frag screw 2, 0.0625 K wire 1  OPERATIVE FINDINGS: No signs of infection. OPERATIVE PROCEDURE:  Patient was brought to the operating room and underwent general anesthetic after a popliteal block. After adequate levels anesthesia obtained patient's left lower extremity was prepped using DuraPrep draped into a sterile field a timeout was called. A dorsal incision was made over the MTP joint and PIP joint of second toe of her previous incision. Examination patient had overhanging bone at the site of the Weil osteotomy. The deep retained screw was removed a Weil osteotomy was performed metatarsal head was translated approximately the overhanging bone was resected this was stabilized with a 2 x 12 mm mini frag screw. The malunion of the PIP fusion was taken down this was revised with the fusion and stabilized with a 0.0625 K wire. Attention was then focused on the third metatarsal. A dorsal incision was made through her previous incision. Homans retractors  replace a Weil osteotomy was performed metatarsal head was translated approximately overhanging bone was resected and this was stabilized with a 2 x 12 mm mini frag screw. The Esmarch from the ankle was released hemostasis was obtained the incisions were closed using 2-0 nylon. A sterile dressing was applied patient was extubated taken to PACU in stable condition plan for discharge to home prescription for Vicodin for pain following office in 1 week

## 2016-12-25 NOTE — Progress Notes (Signed)
2Orthopedic Tech Progress Note Patient Details:  RONELLE MICHIE Oct 15, 1946 [id number]  Ortho Devices Type of Ortho Device: Postop shoe/boot Ortho Device/Splint Location: ;;e Ortho Device/Splint Interventions: Application   Chenel Wernli 12/25/2016, 10:03 AM

## 2016-12-25 NOTE — Transfer of Care (Signed)
Immediate Anesthesia Transfer of Care Note  Patient: Grace Delacruz  Procedure(s) Performed: Procedure(s): 2nd Toe Proximal Interphalangeal Resection, Weil Osteotomy 2nd and 3rd Metatarsal Left Foot (Left)  Patient Location: PACU  Anesthesia Type:General and Regional  Level of Consciousness: awake, alert  and oriented  Airway & Oxygen Therapy: Patient Spontanous Breathing  Post-op Assessment: Report given to RN and Post -op Vital signs reviewed and stable  Post vital signs: Reviewed and stable  Last Vitals:  Vitals:   12/25/16 0715 12/25/16 0716  BP: (!) 125/57   Pulse: 64   Resp: 20   Temp:  36.6 C    Last Pain:  Vitals:   12/25/16 0715  TempSrc: Oral         Complications: No apparent anesthesia complications

## 2016-12-25 NOTE — Anesthesia Procedure Notes (Signed)
Procedure Name: Intubation Date/Time: 12/25/2016 8:28 AM Performed by: Manuela Schwartz B Pre-anesthesia Checklist: Patient identified, Emergency Drugs available, Suction available, Patient being monitored and Timeout performed Patient Re-evaluated:Patient Re-evaluated prior to inductionOxygen Delivery Method: Circle system utilized Preoxygenation: Pre-oxygenation with 100% oxygen Intubation Type: IV induction LMA: LMA inserted LMA Size: 4.0 Number of attempts: 1 Placement Confirmation: positive ETCO2 and breath sounds checked- equal and bilateral Tube secured with: Tape Dental Injury: Teeth and Oropharynx as per pre-operative assessment

## 2016-12-26 ENCOUNTER — Encounter (HOSPITAL_COMMUNITY): Payer: Self-pay | Admitting: Orthopedic Surgery

## 2017-01-01 ENCOUNTER — Ambulatory Visit (INDEPENDENT_AMBULATORY_CARE_PROVIDER_SITE_OTHER): Payer: Medicare Other | Admitting: Orthopedic Surgery

## 2017-01-01 ENCOUNTER — Encounter (INDEPENDENT_AMBULATORY_CARE_PROVIDER_SITE_OTHER): Payer: Self-pay | Admitting: Orthopedic Surgery

## 2017-01-01 ENCOUNTER — Inpatient Hospital Stay (INDEPENDENT_AMBULATORY_CARE_PROVIDER_SITE_OTHER): Payer: Medicare Other | Admitting: Orthopedic Surgery

## 2017-01-01 VITALS — Ht 65.0 in | Wt 265.0 lb

## 2017-01-01 DIAGNOSIS — M205X2 Other deformities of toe(s) (acquired), left foot: Secondary | ICD-10-CM

## 2017-01-01 NOTE — Progress Notes (Signed)
Office Visit Note   Patient: Grace Delacruz           Date of Birth: 1946/04/26           MRN: [id number] Visit Date: 01/01/2017              Requested by: Rory Percy, MD [place], [place] [address] PCP: Rory Percy, MD  Chief Complaint  Patient presents with  . Left Foot - Routine Post Op    2nd Toe Proximal Interphalangeal Resection of malunion and fusion, Weil Osteotomy 2nd and 3rd Metatarsal Left Foot Removal of deep retained hardware 2 screws    HPI: 1 week follow up for left foot. himThe patient states that she got the dressing wet in the shower today. The stitches and pin are intact without complication. [name], RMA    Assessment & Plan: Visit Diagnoses: No diagnosis found.  Plan: Discussed that she may begin cleaning the foot daily. Use antibacterial soap may get this wet. Do not soak in a tub of water. We'll pain the pin track with Neosporin daily. Follow-up in one more week for pin removal.  Follow-Up Instructions: No Follow-up on file.   Ortho Exam Incision is well approximated with sutures. There is minimal bloody drainage. Minimal swelling. No redness no purulence no odor no sign of infection. Pin is intact.  Imaging: No results found.  Labs: Lab Results  Component Value Date   HGBA1C 6.6 (H) 12/20/2016   HGBA1C 6.7 (H) 08/05/2016    Orders:  No orders of the defined types were placed in this encounter.  No orders of the defined types were placed in this encounter.    Procedures: No procedures performed  Clinical Data: No additional findings.  Subjective: Review of Systems  Constitutional: Negative for chills and fever.    Objective: Vital Signs: Ht 5\' 5"  (1.651 m)   Wt 265 lb (120.2 kg)   BMI 44.10 kg/m   Specialty Comments:  No specialty comments available.  PMFS History: Patient Active Problem List   Diagnosis Date Noted  . Presence of retained hardware   . Fused toes of left foot 11/28/2016  . Acquired  claw toe, left   . Acquired contracture of Achilles tendon, right   . Abnormal chest x-ray 10/13/2014  . Snoring 10/04/2013  . Left bundle branch block 08/24/2013  . Essential hypertension, benign 06/19/2012  . Type 2 diabetes mellitus ([place]) 06/19/2012   Past Medical History:  Diagnosis Date  . Anxiety   . Depression   . DJD (degenerative joint disease)   . Esophageal reflux   . Essential hypertension, benign   . Fibrocystic breast disease   . Hypothyroidism   . Left bundle branch block    Transient - possibly rate related   . Menopause   . Osteoarthritis   . PONV (postoperative nausea and vomiting)    pt. would like to have a scopolamine patch  . Restless leg syndrome   . Scoliosis   . Type 2 diabetes mellitus (HCC)     Family History  Problem Relation Age of Onset  . Heart failure Father     Died with pneumonia  . Hypertension Father   . Arthritis Father   . Diabetes Father   . Hypertension Mother   . Asthma Paternal Grandfather   . Emphysema Paternal Grandfather     Past Surgical History:  Procedure Laterality Date  . BREAST BIOPSY     lt x3  .  CARPOMETACARPEL SUSPENSION PLASTY Left 03/08/2014   Procedure: LEFT THUMB CARPOMETACARPEL ([place]) SUSPENSION PLASTY WITH TRAPEZIUM EXCISION;  Surgeon: [name]., MD;  Location: [facility];  Service: Orthopedics;  Laterality: Left;  . CATARACT EXTRACTION Bilateral   . CESAREAN SECTION    . CHOLECYSTECTOMY    . COLONOSCOPY  03/26/2007   RMR:  Diffuse changes of proctocolitis as described above diminutive rectal polyp, cold biopsy/removed, pedunculated polyp of the splenic flexure hot snare removed.  Segmental biopsies the colon taken of the ileocecal valve ulcer biopsied normal terminal ileum  . COLONOSCOPY N/A 01/05/2014   Procedure: COLONOSCOPY;  Surgeon: Rogene Houston, MD;  [facility];  Service: Endoscopy;  Laterality: N/A;  830  . EYE SURGERY    . GASTROCNEMIUS RECESSION Right  08/07/2016   Procedure: GASTROCNEMIUS RECESSION;  Surgeon: Newt Minion, MD;  Location: [facility];  Service: Orthopedics;  Laterality: Right;  . Joint fusion-foot     x2 LEFT FOOT  . KNEE ARTHROPLASTY  2001   Right  . KNEE ARTHROPLASTY  2002   Left  . Left leg vein resection    . ROTATOR CUFF REPAIR     LEFT  . THYROIDECTOMY  1980   3/4  . TONSILLECTOMY    . TUBAL LIGATION    . WEIL OSTEOTOMY Right 08/07/2016   Procedure: WEIL OSTEOTOMY 2nd and 3rd Metatarsal Right Foot;  Surgeon: Newt Minion, MD;  Location: [facility];  Service: Orthopedics;  Laterality: Right;  . WEIL OSTEOTOMY Left 12/25/2016   Procedure: 2nd Toe Proximal Interphalangeal Resection, Weil Osteotomy 2nd and 3rd Metatarsal Left Foot;  Surgeon: Newt Minion, MD;  Location: [facility];  Service: Orthopedics;  Laterality: Left;   Social History   Occupational History  . retired    Social History Main Topics  . Smoking status: Never Smoker  . Smokeless tobacco: Never Used  . Alcohol use 0.0 oz/week     Comment: rare-- glass of wine at christmas  . Drug use: No  . Sexual activity: Not on file

## 2017-01-08 ENCOUNTER — Ambulatory Visit (INDEPENDENT_AMBULATORY_CARE_PROVIDER_SITE_OTHER): Payer: Medicare Other | Admitting: Orthopedic Surgery

## 2017-01-08 ENCOUNTER — Inpatient Hospital Stay (INDEPENDENT_AMBULATORY_CARE_PROVIDER_SITE_OTHER): Payer: Medicare Other | Admitting: Orthopedic Surgery

## 2017-01-08 ENCOUNTER — Encounter (INDEPENDENT_AMBULATORY_CARE_PROVIDER_SITE_OTHER): Payer: Self-pay | Admitting: Orthopedic Surgery

## 2017-01-08 VITALS — Ht 65.0 in | Wt 265.0 lb

## 2017-01-08 DIAGNOSIS — M205X2 Other deformities of toe(s) (acquired), left foot: Secondary | ICD-10-CM

## 2017-01-08 NOTE — Progress Notes (Signed)
Office Visit Note   Patient: Grace Delacruz           Date of Birth: May 02, 1946           MRN: [id number] Visit Date: 01/08/2017              Requested by: Rory Percy, MD [place], [place] [address] PCP: Rory Percy, MD  Chief Complaint  Patient presents with  . Left Foot - Routine Post Op    12/25/16 2nd Toe Proximal Interphalangeal Resection of malunion and fusion, Weil Osteotomy 2nd and 3rd Metatarsal Left Foot Removal of deep retained hardware 2 screws      DJM:EQASTMH is status post claw toe surgery she has no complaints at this time. She is currently nonweightbearing on her kneeling scooter. HPI  Assessment & Plan: Visit Diagnoses:  1. Acquired claw toe, left     Plan: Patient will continue the kneeling scooter. She will start beginning protected weightbearing with her postoperative shoe Dial soap cleansing daily she will wear the mouse pad to prevent dorsiflexion of the toe all of her toes are straight at this time follow-up in 2 weeks to advance to regular shoewear.  Follow-Up Instructions: Return in about 2 weeks (around 01/22/2017).   Ortho Exam Examination her toes are straight there is no floating of the second toe the pin is removed sutures are removed. There is no redness no synovitis no drainage no signs of infection there is no swelling. ROS: Negative fever or chills Imaging: No results found.  Labs: Lab Results  Component Value Date   HGBA1C 6.6 (H) 12/20/2016   HGBA1C 6.7 (H) 08/05/2016    Orders:  No orders of the defined types were placed in this encounter.  No orders of the defined types were placed in this encounter.    Procedures: No procedures performed  Clinical Data: No additional findings.  Subjective: Review of Systems  Objective: Vital Signs: Ht 5\' 5"  (1.651 m)   Wt 265 lb (120.2 kg)   BMI 44.10 kg/m   Specialty Comments:  No specialty comments available.  PMFS History: Patient Active Problem List   Diagnosis Date Noted  . Presence of retained hardware   . Fused toes of left foot 11/28/2016  . Acquired claw toe, left   . Acquired contracture of Achilles tendon, right   . Abnormal chest x-ray 10/13/2014  . Snoring 10/04/2013  . Left bundle branch block 08/24/2013  . Essential hypertension, benign 06/19/2012  . Type 2 diabetes mellitus ([place]) 06/19/2012   Past Medical History:  Diagnosis Date  . Anxiety   . Depression   . DJD (degenerative joint disease)   . Esophageal reflux   . Essential hypertension, benign   . Fibrocystic breast disease   . Hypothyroidism   . Left bundle branch block    Transient - possibly rate related   . Menopause   . Osteoarthritis   . PONV (postoperative nausea and vomiting)    pt. would like to have a scopolamine patch  . Restless leg syndrome   . Scoliosis   . Type 2 diabetes mellitus (H[facility])     Family History  Problem Relation Age of Onset  . Heart failure Father     Died with pneumonia  . Hypertension Father   . Arthritis Father   . Diabetes Father   . Hypertension Mother   . Asthma Paternal Grandfather   . Emphysema Paternal Grandfather     Past Surgical History:  Procedure Laterality Date  . BREAST BIOPSY     lt x3  . CARPOMETACARPEL SUSPENSION PLASTY Left 03/08/2014   Procedure: LEFT THUMB CARPOMETACARPEL ([place]) SUSPENSION PLASTY WITH TRAPEZIUM EXCISION;  Surgeon: [name]., MD;  Location: [facility];  Service: Orthopedics;  Laterality: Left;  . CATARACT EXTRACTION Bilateral   . CESAREAN SECTION    . CHOLECYSTECTOMY    . COLONOSCOPY  03/26/2007   RMR:  Diffuse changes of proctocolitis as described above diminutive rectal polyp, cold biopsy/removed, pedunculated polyp of the splenic flexure hot snare removed.  Segmental biopsies the colon taken of the ileocecal valve ulcer biopsied normal terminal ileum  . COLONOSCOPY N/A 01/05/2014   Procedure: COLONOSCOPY;  Surgeon: Rogene Houston, MD;  [facility];  Service: Endoscopy;  Laterality: N/A;  830  . EYE SURGERY    . GASTROCNEMIUS RECESSION Right 08/07/2016   Procedure: GASTROCNEMIUS RECESSION;  Surgeon: Newt Minion, MD;  Location: [facility];  Service: Orthopedics;  Laterality: Right;  . Joint fusion-foot     x2 LEFT FOOT  . KNEE ARTHROPLASTY  2001   Right  . KNEE ARTHROPLASTY  2002   Left  . Left leg vein resection    . ROTATOR CUFF REPAIR     LEFT  . THYROIDECTOMY  1980   3/4  . TONSILLECTOMY    . TUBAL LIGATION    . WEIL OSTEOTOMY Right 08/07/2016   Procedure: WEIL OSTEOTOMY 2nd and 3rd Metatarsal Right Foot;  Surgeon: Newt Minion, MD;  Location: [facility];  Service: Orthopedics;  Laterality: Right;  . WEIL OSTEOTOMY Left 12/25/2016   Procedure: 2nd Toe Proximal Interphalangeal Resection, Weil Osteotomy 2nd and 3rd Metatarsal Left Foot;  Surgeon: Newt Minion, MD;  Location: [facility];  Service: Orthopedics;  Laterality: Left;   Social History   Occupational History  . retired    Social History Main Topics  . Smoking status: Never Smoker  . Smokeless tobacco: Never Used  . Alcohol use 0.0 oz/week     Comment: rare-- glass of wine at christmas  . Drug use: No  . Sexual activity: Not on file

## 2017-01-22 ENCOUNTER — Ambulatory Visit (INDEPENDENT_AMBULATORY_CARE_PROVIDER_SITE_OTHER): Payer: Medicare Other | Admitting: Orthopedic Surgery

## 2017-01-22 VITALS — Ht 65.0 in | Wt 265.0 lb

## 2017-01-22 DIAGNOSIS — Z969 Presence of functional implant, unspecified: Secondary | ICD-10-CM

## 2017-01-22 DIAGNOSIS — M205X2 Other deformities of toe(s) (acquired), left foot: Secondary | ICD-10-CM

## 2017-01-22 NOTE — Progress Notes (Signed)
Office Visit Note   Patient: Grace Delacruz           Date of Birth: Jan 17, 1946           MRN: [id number] Visit Date: 01/22/2017              Requested by: Rory Percy, MD [place], [place] [address] PCP: Rory Percy, MD  Chief Complaint  Patient presents with  . Left Foot - Routine Post Op    12/25/16 left foot 2nd toe resection and weil osteotomy 2nd and 3rd removal deep hardware.       HPI: The patient is 71 year old woman who presents today status post Weil osteotomy second and third toes with removal deep retained hardware on the left. As was done on March 7 of this year. Today she is in regular shoes. Has been up on her feet a lot. Complains of some swelling. No pain. No concerns.   Assessment & Plan: Visit Diagnoses: No diagnosis found.  Plan: resume regular shoewear. may discontinue the mouse pad. We'll follow-up in office as needed.  Follow-Up Instructions: No Follow-up on file.   Ortho Exam  Patient is alert, oriented, no adenopathy, well-dressed, normal affect, normal respiratory effort. Left foot: incisions are well healed. Second and third toes are straight. No open areas. No redness. No sign of infection. Toes nontender. No plantar ulceration.  Imaging: No results found.  Labs: Lab Results  Component Value Date   HGBA1C 6.6 (H) 12/20/2016   HGBA1C 6.7 (H) 08/05/2016    Orders:  No orders of the defined types were placed in this encounter.  No orders of the defined types were placed in this encounter.    Procedures: No procedures performed  Clinical Data: No additional findings.  ROS:  All other systems negative, except as noted in the HPI. Review of Systems  Constitutional: Negative for chills and fever.  Musculoskeletal: Negative for arthralgias and gait problem.  Skin: Negative for wound.    Objective: Vital Signs: Ht 5\' 5"  (1.651 m)   Wt 265 lb (120.2 kg)   BMI 44.10 kg/m   Specialty Comments:  No specialty comments  available.  PMFS History: Patient Active Problem List   Diagnosis Date Noted  . Presence of retained hardware   . Fused toes of left foot 11/28/2016  . Acquired claw toe, left   . Acquired contracture of Achilles tendon, right   . Abnormal chest x-ray 10/13/2014  . Snoring 10/04/2013  . Left bundle branch block 08/24/2013  . Essential hypertension, benign 06/19/2012  . Type 2 diabetes mellitus ([place]) 06/19/2012   Past Medical History:  Diagnosis Date  . Anxiety   . Depression   . DJD (degenerative joint disease)   . Esophageal reflux   . Essential hypertension, benign   . Fibrocystic breast disease   . Hypothyroidism   . Left bundle branch block    Transient - possibly rate related   . Menopause   . Osteoarthritis   . PONV (postoperative nausea and vomiting)    pt. would like to have a scopolamine patch  . Restless leg syndrome   . Scoliosis   . Type 2 diabetes mellitus (HCC)     Family History  Problem Relation Age of Onset  . Heart failure Father     Died with pneumonia  . Hypertension Father   . Arthritis Father   . Diabetes Father   . Hypertension Mother   . Asthma Paternal Grandfather   .  Emphysema Paternal Grandfather     Past Surgical History:  Procedure Laterality Date  . BREAST BIOPSY     lt x3  . CARPOMETACARPEL SUSPENSION PLASTY Left 03/08/2014   Procedure: LEFT THUMB CARPOMETACARPEL ([place]) SUSPENSION PLASTY WITH TRAPEZIUM EXCISION;  Surgeon: [name]., MD;  Location: [facility];  Service: Orthopedics;  Laterality: Left;  . CATARACT EXTRACTION Bilateral   . CESAREAN SECTION    . CHOLECYSTECTOMY    . COLONOSCOPY  03/26/2007   RMR:  Diffuse changes of proctocolitis as described above diminutive rectal polyp, cold biopsy/removed, pedunculated polyp of the splenic flexure hot snare removed.  Segmental biopsies the colon taken of the ileocecal valve ulcer biopsied normal terminal ileum  . COLONOSCOPY N/A 01/05/2014   Procedure:  COLONOSCOPY;  Surgeon: Rogene Houston, MD;  [facility];  Service: Endoscopy;  Laterality: N/A;  830  . EYE SURGERY    . GASTROCNEMIUS RECESSION Right 08/07/2016   Procedure: GASTROCNEMIUS RECESSION;  Surgeon: Newt Minion, MD;  Location: [facility];  Service: Orthopedics;  Laterality: Right;  . Joint fusion-foot     x2 LEFT FOOT  . KNEE ARTHROPLASTY  2001   Right  . KNEE ARTHROPLASTY  2002   Left  . Left leg vein resection    . ROTATOR CUFF REPAIR     LEFT  . THYROIDECTOMY  1980   3/4  . TONSILLECTOMY    . TUBAL LIGATION    . WEIL OSTEOTOMY Right 08/07/2016   Procedure: WEIL OSTEOTOMY 2nd and 3rd Metatarsal Right Foot;  Surgeon: Newt Minion, MD;  Location: [facility];  Service: Orthopedics;  Laterality: Right;  . WEIL OSTEOTOMY Left 12/25/2016   Procedure: 2nd Toe Proximal Interphalangeal Resection, Weil Osteotomy 2nd and 3rd Metatarsal Left Foot;  Surgeon: Newt Minion, MD;  Location: [facility];  Service: Orthopedics;  Laterality: Left;   Social History   Occupational History  . retired    Social History Main Topics  . Smoking status: Never Smoker  . Smokeless tobacco: Never Used  . Alcohol use 0.0 oz/week     Comment: rare-- glass of wine at christmas  . Drug use: No  . Sexual activity: Not on file

## 2017-01-28 DIAGNOSIS — E039 Hypothyroidism, unspecified: Secondary | ICD-10-CM | POA: Diagnosis not present

## 2017-01-28 DIAGNOSIS — I10 Essential (primary) hypertension: Secondary | ICD-10-CM | POA: Diagnosis not present

## 2017-01-28 DIAGNOSIS — Z6841 Body Mass Index (BMI) 40.0 and over, adult: Secondary | ICD-10-CM | POA: Diagnosis not present

## 2017-01-28 DIAGNOSIS — Z0000 Encounter for general adult medical examination without abnormal findings: Secondary | ICD-10-CM | POA: Diagnosis not present

## 2017-01-28 DIAGNOSIS — M1990 Unspecified osteoarthritis, unspecified site: Secondary | ICD-10-CM | POA: Diagnosis not present

## 2017-01-28 DIAGNOSIS — M419 Scoliosis, unspecified: Secondary | ICD-10-CM | POA: Diagnosis not present

## 2017-01-28 DIAGNOSIS — E7800 Pure hypercholesterolemia, unspecified: Secondary | ICD-10-CM | POA: Diagnosis not present

## 2017-01-28 DIAGNOSIS — E119 Type 2 diabetes mellitus without complications: Secondary | ICD-10-CM | POA: Diagnosis not present

## 2017-02-14 NOTE — Progress Notes (Deleted)
Cardiology Office Note  Date: 02/14/2017   ID: Grace Delacruz, Grace Delacruz 05-04-1946, MRN [id number]  PCP: Hilaria Ota, MD  Primary Cardiologist: Rozann Lesches, MD   No chief complaint on file.   History of Present Illness: TIAUNNA Delacruz is a 71 y.o. female last seen in April 2017.  Risk records indicate recent left foot surgery with Dr. Sharol Given.  Past Medical History:  Diagnosis Date  . Anxiety   . Depression   . DJD (degenerative joint disease)   . Esophageal reflux   . Essential hypertension, benign   . Fibrocystic breast disease   . Hypothyroidism   . Left bundle branch block    Transient - possibly rate related   . Menopause   . Osteoarthritis   . PONV (postoperative nausea and vomiting)    pt. would like to have a scopolamine patch  . Restless leg syndrome   . Scoliosis   . Type 2 diabetes mellitus ([place])     Past Surgical History:  Procedure Laterality Date  . BREAST BIOPSY     lt x3  . CARPOMETACARPEL SUSPENSION PLASTY Left 03/08/2014   Procedure: LEFT THUMB CARPOMETACARPEL ([name]) SUSPENSION PLASTY WITH TRAPEZIUM EXCISION;  Surgeon: [name]., MD;  Location: [place];  Service: Orthopedics;  Laterality: Left;  . CATARACT EXTRACTION Bilateral   . CESAREAN SECTION    . CHOLECYSTECTOMY    . COLONOSCOPY  03/26/2007   RMR:  Diffuse changes of proctocolitis as described above diminutive rectal polyp, cold biopsy/removed, pedunculated polyp of the splenic flexure hot snare removed.  Segmental biopsies the colon taken of the ileocecal valve ulcer biopsied normal terminal ileum  . COLONOSCOPY N/A 01/05/2014   Procedure: COLONOSCOPY;  Surgeon: Rogene Houston, MD;  [facility];  Service: Endoscopy;  Laterality: N/A;  830  . EYE SURGERY    . GASTROCNEMIUS RECESSION Right 08/07/2016   Procedure: GASTROCNEMIUS RECESSION;  Surgeon: Newt Minion, MD;  Location: [place];  Service: Orthopedics;  Laterality: Right;  . Joint fusion-foot       x2 LEFT FOOT  . KNEE ARTHROPLASTY  2001   Right  . KNEE ARTHROPLASTY  2002   Left  . Left leg vein resection    . ROTATOR CUFF REPAIR     LEFT  . THYROIDECTOMY  1980   3/4  . TONSILLECTOMY    . TUBAL LIGATION    . WEIL OSTEOTOMY Right 08/07/2016   Procedure: WEIL OSTEOTOMY 2nd and 3rd Metatarsal Right Foot;  Surgeon: Newt Minion, MD;  Location: [place];  Service: Orthopedics;  Laterality: Right;  . WEIL OSTEOTOMY Left 12/25/2016   Procedure: 2nd Toe Proximal Interphalangeal Resection, Weil Osteotomy 2nd and 3rd Metatarsal Left Foot;  Surgeon: Newt Minion, MD;  Location: [place];  Service: Orthopedics;  Laterality: Left;    Current Outpatient Prescriptions  Medication Sig Dispense Refill  . amLODipine (NORVASC) 10 MG tablet Take 10 mg by mouth every morning.     Marland Kitchen aspirin EC 81 MG tablet Take 81 mg by mouth every evening.    . docusate sodium (COLACE) 100 MG capsule Take 100 mg by mouth at bedtime.    Marland Kitchen FLUoxetine (PROZAC) 20 MG capsule Take 20 mg by mouth every morning.     . furosemide (LASIX) 40 MG tablet Take 40 mg by mouth daily.    Marland Kitchen HYDROcodone-acetaminophen (NORCO) 5-325 MG tablet Take 1 tablet by mouth every 6 (six) hours as needed.  40 tablet 0  . ibuprofen (ADVIL,MOTRIN) 200 MG tablet Take 400 mg by mouth at bedtime.     Javier Docker Oil 350 MG CAPS Take 350 mg by mouth daily.     Marland Kitchen levothyroxine (SYNTHROID, LEVOTHROID) 100 MCG tablet Take 100 mcg by mouth daily before breakfast.     . loratadine (CLARITIN) 10 MG tablet Take 10 mg by mouth daily as needed for allergies.     Marland Kitchen losartan (COZAAR) 100 MG tablet Take 1 tablet (100 mg total) by mouth every evening.    . meclizine (ANTIVERT) 25 MG tablet Take 25 mg by mouth 3 (three) times daily as needed for dizziness.     . metFORMIN (GLUCOPHAGE-XR) 500 MG 24 hr tablet Take 500 mg by mouth daily.    . metoprolol succinate (TOPROL-XL) 100 MG 24 hr tablet Take 100 mg by mouth every morning. Take with or immediately following a meal.     . omeprazole (PRILOSEC) 20 MG capsule Take 20 mg by mouth daily before breakfast.    . simvastatin (ZOCOR) 40 MG tablet Take 40 mg by mouth at bedtime.    Marland Kitchen spironolactone (ALDACTONE) 25 MG tablet Take 25 mg by mouth daily.    Marland Kitchen triamcinolone cream (KENALOG) 0.5 % Apply 1 application topically 3 (three) times daily as needed. For rash/irritation.    . Vitamin D, Cholecalciferol, 1000 UNITS CAPS Take 1,000 mg by mouth 2 (two) times daily.     No current facility-administered medications for this visit.    Allergies:  Ephedrine; Hibiclens [chlorhexidine gluconate]; and Keflex [cephalexin]   Social History: The patient  reports that she has never smoked. She has never used smokeless tobacco. She reports that she drinks alcohol. She reports that she does not use drugs.   Family History: The patient's family history includes Arthritis in her father; Asthma in her paternal grandfather; Diabetes in her father; Emphysema in her paternal grandfather; Heart failure in her father; Hypertension in her father and mother.   ROS:  Please see the history of present illness. Otherwise, complete review of systems is positive for {NONE DEFAULTED:18576::"none"}.  All other systems are reviewed and negative.   Physical Exam: VS:  There were no vitals taken for this visit., BMI There is no height or weight on file to calculate BMI.  Wt Readings from Last 3 Encounters:  01/22/17 265 lb (120.2 kg)  01/08/17 265 lb (120.2 kg)  01/01/17 265 lb (120.2 kg)    General: Obese woman, appears comfortable at rest. HEENT: Conjunctiva and lids normal, oropharynx clear with moist mucosa. Neck: Supple, no elevated JVP or carotid bruits, no thyromegaly. Lungs: Clear to auscultation, nonlabored breathing at rest. Cardiac: Regular rate and rhythm, no S3 or significant systolic murmur, no pericardial rub. Abdomen: Soft, nontender, no hepatomegaly, bowel sounds present, no guarding or rebound. Extremities: No pitting edema,  distal pulses 2+.  ECG: I personally reviewed the tracing from 02/14/2016 which showed sinus rhythm with poor R-wave progression and nonspecific T-wave changes.  Recent Labwork: 12/20/2016: BUN 20; Creatinine, Ser 1.12; Hemoglobin 14.4; Platelets 243; Potassium 4.3; Sodium 140   Other Studies Reviewed Today:  Echocardiogram August 2013: Moderate LVH with LVEF of 13-08%, grade 1 diastolic dysfunction, paradoxical septal motion, mild left atrial enlargement. No major abnormalities.   [place] 2013: No significant ST segment changes and overall normal perfusion, no clear evidence of ischemia.  Assessment and Plan:    Current medicines were reviewed with the patient today.  No orders of the  defined types were placed in this encounter.   Disposition:  Signed, Satira Sark, MD, [facility] 02/14/2017 9:30 AM    [facility] at [place], [place], [place] [address] Phone: [phone]; Fax: [phone]

## 2017-02-17 ENCOUNTER — Ambulatory Visit: Payer: Medicare Other | Admitting: Cardiology

## 2017-02-20 NOTE — Progress Notes (Signed)
Cardiology Office Note  Date: 02/21/2017   ID: Grace Delacruz, DOB [birth year], MRN [id number]  PCP: Hilaria Ota, MD  Primary Cardiologist: Rozann Lesches, MD   Chief Complaint  Patient presents with  . Cardiac follow-up    History of Present Illness: Grace Delacruz is a 71 y.o. female last seen in April 2017. She presents for a routine follow-up visit. We talked about her mother-in-law's passing at age 62, she was a long-term patient of mine. She states that she has been feeling well, plans to go to the [facility] for water aerobics starting this summer. She does not report any exertional chest pain. No palpitations or syncope.  I personally reviewed her ECG today which shows sinus rhythm with borderline low voltage and decreased R wave progression.  I reviewed her medications which are outlined below. Regimen includes aspirin, Norvasc, Lasix, Cozaar, Toprol-XL, Aldactone, and Zocor.   Past Medical History:  Diagnosis Date  . Anxiety   . Depression   . DJD (degenerative joint disease)   . Esophageal reflux   . Essential hypertension, benign   . Fibrocystic breast disease   . Hypothyroidism   . Left bundle branch block    Transient - possibly rate related   . Menopause   . Osteoarthritis   . PONV (postoperative nausea and vomiting)    pt. would like to have a scopolamine patch  . Restless leg syndrome   . Scoliosis   . Type 2 diabetes mellitus ([place])     Past Surgical History:  Procedure Laterality Date  . BREAST BIOPSY     lt x3  . CARPOMETACARPEL SUSPENSION PLASTY Left 03/08/2014   Procedure: LEFT THUMB CARPOMETACARPEL ([place]) SUSPENSION PLASTY WITH TRAPEZIUM EXCISION;  Surgeon: [name]., MD;  Location: [place];  Service: Orthopedics;  Laterality: Left;  . CATARACT EXTRACTION Bilateral   . CESAREAN SECTION    . CHOLECYSTECTOMY    . COLONOSCOPY  03/26/2007   RMR:  Diffuse changes of proctocolitis as described above diminutive rectal  polyp, cold biopsy/removed, pedunculated polyp of the splenic flexure hot snare removed.  Segmental biopsies the colon taken of the ileocecal valve ulcer biopsied normal terminal ileum  . COLONOSCOPY N/A 01/05/2014   Procedure: COLONOSCOPY;  Surgeon: Rogene Houston, MD;  [facility];  Service: Endoscopy;  Laterality: N/A;  830  . EYE SURGERY    . GASTROCNEMIUS RECESSION Right 08/07/2016   Procedure: GASTROCNEMIUS RECESSION;  Surgeon: Newt Minion, MD;  Location: [place];  Service: Orthopedics;  Laterality: Right;  . Joint fusion-foot     x2 LEFT FOOT  . KNEE ARTHROPLASTY  2001   Right  . KNEE ARTHROPLASTY  2002   Left  . Left leg vein resection    . ROTATOR CUFF REPAIR     LEFT  . THYROIDECTOMY  1980   3/4  . TONSILLECTOMY    . TUBAL LIGATION    . WEIL OSTEOTOMY Right 08/07/2016   Procedure: WEIL OSTEOTOMY 2nd and 3rd Metatarsal Right Foot;  Surgeon: Newt Minion, MD;  Location: [place];  Service: Orthopedics;  Laterality: Right;  . WEIL OSTEOTOMY Left 12/25/2016   Procedure: 2nd Toe Proximal Interphalangeal Resection, Weil Osteotomy 2nd and 3rd Metatarsal Left Foot;  Surgeon: Newt Minion, MD;  Location: [place];  Service: Orthopedics;  Laterality: Left;    Current Outpatient Prescriptions  Medication Sig Dispense Refill  . amLODipine (NORVASC) 10 MG tablet Take 10 mg by  mouth every morning.     Marland Kitchen aspirin EC 81 MG tablet Take 81 mg by mouth every evening.    . docusate sodium (COLACE) 100 MG capsule Take 100 mg by mouth at bedtime.    Marland Kitchen FLUoxetine (PROZAC) 20 MG capsule Take 20 mg by mouth every morning.     . furosemide (LASIX) 40 MG tablet Take 40 mg by mouth daily.    Marland Kitchen ibuprofen (ADVIL,MOTRIN) 200 MG tablet Take 400 mg by mouth at bedtime.     Javier Docker Oil 350 MG CAPS Take 350 mg by mouth daily.     Marland Kitchen levothyroxine (SYNTHROID, LEVOTHROID) 100 MCG tablet Take 100 mcg by mouth daily before breakfast.     . loratadine (CLARITIN) 10 MG tablet Take 10 mg by mouth daily as  needed for allergies.     Marland Kitchen losartan (COZAAR) 100 MG tablet Take 1 tablet (100 mg total) by mouth every evening.    . meclizine (ANTIVERT) 25 MG tablet Take 25 mg by mouth 3 (three) times daily as needed for dizziness.     . metFORMIN (GLUCOPHAGE-XR) 500 MG 24 hr tablet Take 500 mg by mouth daily.    . metoprolol succinate (TOPROL-XL) 100 MG 24 hr tablet Take 100 mg by mouth every morning. Take with or immediately following a meal.    . omeprazole (PRILOSEC) 20 MG capsule Take 20 mg by mouth daily before breakfast.    . simvastatin (ZOCOR) 40 MG tablet Take 40 mg by mouth at bedtime.    Marland Kitchen spironolactone (ALDACTONE) 25 MG tablet Take 25 mg by mouth daily.    Marland Kitchen triamcinolone cream (KENALOG) 0.5 % Apply 1 application topically 3 (three) times daily as needed. For rash/irritation.    . Vitamin D, Cholecalciferol, 1000 UNITS CAPS Take 1,000 mg by mouth 2 (two) times daily.     No current facility-administered medications for this visit.    Allergies:  Ephedrine; Hibiclens [chlorhexidine gluconate]; and Keflex [cephalexin]   Social History: The patient  reports that she has never smoked. She has never used smokeless tobacco. She reports that she drinks alcohol. She reports that she does not use drugs.   ROS:  Please see the history of present illness. Otherwise, complete review of systems is positive for none.  All other systems are reviewed and negative.   Physical Exam: VS:  BP 107/67   Pulse 67   Ht 5\' 5"  (1.651 m)   Wt 267 lb (121.1 kg)   BMI 44.43 kg/m , BMI Body mass index is 44.43 kg/m.  Wt Readings from Last 3 Encounters:  02/21/17 267 lb (121.1 kg)  01/22/17 265 lb (120.2 kg)  01/08/17 265 lb (120.2 kg)    General: Obese woman, appears comfortable at rest. HEENT: Conjunctiva and lids normal, oropharynx clear with moist mucosa. Neck: Supple, no elevated JVP or carotid bruits, no thyromegaly. Lungs: Clear to auscultation, nonlabored breathing at rest. Cardiac: Regular rate and  rhythm, no S3 or significant systolic murmur, no pericardial rub. Abdomen: Soft, nontender, no hepatomegaly, bowel sounds present, no guarding or rebound. Extremities: No pitting edema, distal pulses 2+.  ECG: I personally reviewed the tracing from 02/14/2016 which showed sinus rhythm with decreased R wave progression and nonspecific T-wave changes.  Recent Labwork: 12/20/2016: BUN 20; Creatinine, Ser 1.12; Hemoglobin 14.4; Platelets 243; Potassium 4.3; Sodium 140  Other Studies Reviewed Today:  Echocardiogram August 2013: Moderate LVH with LVEF of 67-34%, grade 1 diastolic dysfunction, paradoxical septal motion, mild left atrial enlargement.  No major abnormalities.   [place] 2013: No significant ST segment changes and overall normal perfusion, no clear evidence of ischemia.  Assessment and Plan:  1. History of intermittent left bundle-branch block, likely rate related. No other history of conduction system disease, palpitations, or syncope. ECG reviewed today and stable. She has had previous reassuring ischemic workup and does not report angina at this time. We will continue with observation. I encouraged her to pursue a regular exercise plan.  2. Essential hypertension, blood pressure is well controlled today on current regimen. No changes were made.  Current medicines were reviewed with the patient today.   Orders Placed This Encounter  Procedures  . EKG 12-Lead    Disposition: Follow-up in one year.  Signed, Satira Sark, MD, [facility] 02/21/2017 12:13 PM    [facility] [place], [place], [place] [address] Phone: [phone]; Fax: [phone]

## 2017-02-21 ENCOUNTER — Ambulatory Visit (INDEPENDENT_AMBULATORY_CARE_PROVIDER_SITE_OTHER): Payer: Medicare Other | Admitting: Cardiology

## 2017-02-21 ENCOUNTER — Encounter: Payer: Self-pay | Admitting: Cardiology

## 2017-02-21 VITALS — BP 107/67 | HR 67 | Ht 65.0 in | Wt 267.0 lb

## 2017-02-21 DIAGNOSIS — I10 Essential (primary) hypertension: Secondary | ICD-10-CM

## 2017-02-21 DIAGNOSIS — I447 Left bundle-branch block, unspecified: Secondary | ICD-10-CM | POA: Diagnosis not present

## 2017-02-21 NOTE — Patient Instructions (Signed)

## 2017-03-13 DIAGNOSIS — Z6841 Body Mass Index (BMI) 40.0 and over, adult: Secondary | ICD-10-CM | POA: Diagnosis not present

## 2017-03-13 DIAGNOSIS — E039 Hypothyroidism, unspecified: Secondary | ICD-10-CM | POA: Diagnosis not present

## 2017-03-13 DIAGNOSIS — I10 Essential (primary) hypertension: Secondary | ICD-10-CM | POA: Diagnosis not present

## 2017-03-13 DIAGNOSIS — Z0001 Encounter for general adult medical examination with abnormal findings: Secondary | ICD-10-CM | POA: Diagnosis not present

## 2017-03-22 NOTE — Addendum Note (Signed)
Addendum  created 03/22/17 1010 by Duane Boston, MD   Sign clinical note

## 2017-04-28 DIAGNOSIS — Z79899 Other long term (current) drug therapy: Secondary | ICD-10-CM | POA: Diagnosis not present

## 2017-04-28 DIAGNOSIS — E039 Hypothyroidism, unspecified: Secondary | ICD-10-CM | POA: Diagnosis not present

## 2017-04-28 DIAGNOSIS — Z780 Asymptomatic menopausal state: Secondary | ICD-10-CM | POA: Diagnosis not present

## 2017-04-28 DIAGNOSIS — E119 Type 2 diabetes mellitus without complications: Secondary | ICD-10-CM | POA: Diagnosis not present

## 2017-04-28 DIAGNOSIS — Z7982 Long term (current) use of aspirin: Secondary | ICD-10-CM | POA: Diagnosis not present

## 2017-04-28 DIAGNOSIS — E28319 Asymptomatic premature menopause: Secondary | ICD-10-CM | POA: Diagnosis not present

## 2017-04-28 DIAGNOSIS — Z96653 Presence of artificial knee joint, bilateral: Secondary | ICD-10-CM | POA: Diagnosis not present

## 2017-04-28 DIAGNOSIS — Z7984 Long term (current) use of oral hypoglycemic drugs: Secondary | ICD-10-CM | POA: Diagnosis not present

## 2017-04-28 DIAGNOSIS — I10 Essential (primary) hypertension: Secondary | ICD-10-CM | POA: Diagnosis not present

## 2017-04-28 DIAGNOSIS — E7800 Pure hypercholesterolemia, unspecified: Secondary | ICD-10-CM | POA: Diagnosis not present

## 2017-04-28 DIAGNOSIS — M419 Scoliosis, unspecified: Secondary | ICD-10-CM | POA: Diagnosis not present

## 2017-04-28 DIAGNOSIS — M810 Age-related osteoporosis without current pathological fracture: Secondary | ICD-10-CM | POA: Diagnosis not present

## 2017-05-22 DIAGNOSIS — Z1231 Encounter for screening mammogram for malignant neoplasm of breast: Secondary | ICD-10-CM | POA: Diagnosis not present

## 2017-06-18 DIAGNOSIS — L570 Actinic keratosis: Secondary | ICD-10-CM | POA: Diagnosis not present

## 2017-07-17 DIAGNOSIS — Z23 Encounter for immunization: Secondary | ICD-10-CM | POA: Diagnosis not present

## 2017-08-27 DIAGNOSIS — M1990 Unspecified osteoarthritis, unspecified site: Secondary | ICD-10-CM | POA: Diagnosis not present

## 2017-08-27 DIAGNOSIS — E039 Hypothyroidism, unspecified: Secondary | ICD-10-CM | POA: Diagnosis not present

## 2017-08-27 DIAGNOSIS — E7800 Pure hypercholesterolemia, unspecified: Secondary | ICD-10-CM | POA: Diagnosis not present

## 2017-08-27 DIAGNOSIS — I10 Essential (primary) hypertension: Secondary | ICD-10-CM | POA: Diagnosis not present

## 2017-08-27 DIAGNOSIS — E119 Type 2 diabetes mellitus without complications: Secondary | ICD-10-CM | POA: Diagnosis not present

## 2017-09-02 DIAGNOSIS — Z6841 Body Mass Index (BMI) 40.0 and over, adult: Secondary | ICD-10-CM | POA: Diagnosis not present

## 2017-09-02 DIAGNOSIS — E039 Hypothyroidism, unspecified: Secondary | ICD-10-CM | POA: Diagnosis not present

## 2017-09-02 DIAGNOSIS — M1990 Unspecified osteoarthritis, unspecified site: Secondary | ICD-10-CM | POA: Diagnosis not present

## 2017-09-02 DIAGNOSIS — R609 Edema, unspecified: Secondary | ICD-10-CM | POA: Diagnosis not present

## 2017-09-02 DIAGNOSIS — E119 Type 2 diabetes mellitus without complications: Secondary | ICD-10-CM | POA: Diagnosis not present

## 2017-12-03 DIAGNOSIS — N6489 Other specified disorders of breast: Secondary | ICD-10-CM | POA: Diagnosis not present

## 2017-12-03 DIAGNOSIS — R928 Other abnormal and inconclusive findings on diagnostic imaging of breast: Secondary | ICD-10-CM | POA: Diagnosis not present

## 2017-12-03 DIAGNOSIS — N6331 Unspecified lump in axillary tail of the right breast: Secondary | ICD-10-CM | POA: Diagnosis not present

## 2017-12-17 DIAGNOSIS — D2112 Benign neoplasm of connective and other soft tissue of left upper limb, including shoulder: Secondary | ICD-10-CM | POA: Diagnosis not present

## 2017-12-17 DIAGNOSIS — L570 Actinic keratosis: Secondary | ICD-10-CM | POA: Diagnosis not present

## 2017-12-17 DIAGNOSIS — L509 Urticaria, unspecified: Secondary | ICD-10-CM | POA: Diagnosis not present

## 2017-12-17 DIAGNOSIS — L309 Dermatitis, unspecified: Secondary | ICD-10-CM | POA: Diagnosis not present

## 2017-12-17 DIAGNOSIS — D485 Neoplasm of uncertain behavior of skin: Secondary | ICD-10-CM | POA: Diagnosis not present

## 2018-01-29 DIAGNOSIS — H8309 Labyrinthitis, unspecified ear: Secondary | ICD-10-CM | POA: Diagnosis not present

## 2018-01-29 DIAGNOSIS — E039 Hypothyroidism, unspecified: Secondary | ICD-10-CM | POA: Diagnosis not present

## 2018-01-29 DIAGNOSIS — M419 Scoliosis, unspecified: Secondary | ICD-10-CM | POA: Diagnosis not present

## 2018-01-29 DIAGNOSIS — Z6841 Body Mass Index (BMI) 40.0 and over, adult: Secondary | ICD-10-CM | POA: Diagnosis not present

## 2018-01-29 DIAGNOSIS — E7800 Pure hypercholesterolemia, unspecified: Secondary | ICD-10-CM | POA: Diagnosis not present

## 2018-01-29 DIAGNOSIS — E119 Type 2 diabetes mellitus without complications: Secondary | ICD-10-CM | POA: Diagnosis not present

## 2018-01-29 DIAGNOSIS — I10 Essential (primary) hypertension: Secondary | ICD-10-CM | POA: Diagnosis not present

## 2018-02-02 DIAGNOSIS — E039 Hypothyroidism, unspecified: Secondary | ICD-10-CM | POA: Diagnosis not present

## 2018-02-02 DIAGNOSIS — Z0001 Encounter for general adult medical examination with abnormal findings: Secondary | ICD-10-CM | POA: Diagnosis not present

## 2018-02-02 DIAGNOSIS — I10 Essential (primary) hypertension: Secondary | ICD-10-CM | POA: Diagnosis not present

## 2018-02-02 DIAGNOSIS — E1165 Type 2 diabetes mellitus with hyperglycemia: Secondary | ICD-10-CM | POA: Diagnosis not present

## 2018-02-02 DIAGNOSIS — Z23 Encounter for immunization: Secondary | ICD-10-CM | POA: Diagnosis not present

## 2018-02-02 DIAGNOSIS — Z1331 Encounter for screening for depression: Secondary | ICD-10-CM | POA: Diagnosis not present

## 2018-02-02 DIAGNOSIS — Z1389 Encounter for screening for other disorder: Secondary | ICD-10-CM | POA: Diagnosis not present

## 2018-02-18 DIAGNOSIS — M41125 Adolescent idiopathic scoliosis, thoracolumbar region: Secondary | ICD-10-CM | POA: Diagnosis not present

## 2018-02-18 DIAGNOSIS — Z6841 Body Mass Index (BMI) 40.0 and over, adult: Secondary | ICD-10-CM | POA: Diagnosis not present

## 2018-02-24 ENCOUNTER — Ambulatory Visit: Payer: Medicare Other | Admitting: Cardiology

## 2018-04-15 ENCOUNTER — Encounter: Payer: Self-pay | Admitting: Cardiology

## 2018-04-15 ENCOUNTER — Ambulatory Visit (INDEPENDENT_AMBULATORY_CARE_PROVIDER_SITE_OTHER): Payer: Medicare Other | Admitting: Cardiology

## 2018-04-15 VITALS — BP 118/60 | HR 68 | Ht 65.0 in | Wt 263.6 lb

## 2018-04-15 DIAGNOSIS — I447 Left bundle-branch block, unspecified: Secondary | ICD-10-CM | POA: Diagnosis not present

## 2018-04-15 DIAGNOSIS — I10 Essential (primary) hypertension: Secondary | ICD-10-CM | POA: Diagnosis not present

## 2018-04-15 DIAGNOSIS — R002 Palpitations: Secondary | ICD-10-CM

## 2018-04-15 NOTE — Progress Notes (Signed)
Cardiology Office Note  Date: 04/15/2018   ID: Grace Delacruz, Grace Delacruz 04/28/1946, MRN [id number]  PCP: Rory Percy, MD  Primary Cardiologist: Rozann Lesches, MD   Chief Complaint  Patient presents with  . Cardiac follow-up    History of Present Illness: Grace Delacruz is a 72 y.o. female last seen in May 2018.  She presents for a routine visit.  Since last assessment she states that she has been noticing intermittent palpitations, sometimes as frequently as twice a week.  She describes it as a "fluttering" discomfort in her chest that is sporadic, usually more in the evenings than early morning.  She has had no associated lightheadedness or syncope.  I personally reviewed her ECG today which shows sinus rhythm with borderline low voltage.  He has a previous history of transient left bundle branch block and reassuring ischemic work-up.  We went over her medications.  She remains on Toprol-XL 100 mg daily along with aspirin, Norvasc, Lasix, Cozaar, and Zocor.  Past Medical History:  Diagnosis Date  . Anxiety   . Depression   . DJD (degenerative joint disease)   . Esophageal reflux   . Essential hypertension, benign   . Fibrocystic breast disease   . Hypothyroidism   . Left bundle branch block    Transient - possibly rate related   . Menopause   . Osteoarthritis   . PONV (postoperative nausea and vomiting)    pt. would like to have a scopolamine patch  . Restless leg syndrome   . Scoliosis   . Type 2 diabetes mellitus ([place])     Past Surgical History:  Procedure Laterality Date  . BREAST BIOPSY     lt x3  . CARPOMETACARPEL SUSPENSION PLASTY Left 03/08/2014   Procedure: LEFT THUMB CARPOMETACARPEL ([place]) SUSPENSION PLASTY WITH TRAPEZIUM EXCISION;  Surgeon: [name]., MD;  Location: [place];  Service: Orthopedics;  Laterality: Left;  . CATARACT EXTRACTION Bilateral   . CESAREAN SECTION    . CHOLECYSTECTOMY    . COLONOSCOPY  03/26/2007   RMR:   Diffuse changes of proctocolitis as described above diminutive rectal polyp, cold biopsy/removed, pedunculated polyp of the splenic flexure hot snare removed.  Segmental biopsies the colon taken of the ileocecal valve ulcer biopsied normal terminal ileum  . COLONOSCOPY N/A 01/05/2014   Procedure: COLONOSCOPY;  Surgeon: Rogene Houston, MD;  [facility];  Service: Endoscopy;  Laterality: N/A;  830  . EYE SURGERY    . GASTROCNEMIUS RECESSION Right 08/07/2016   Procedure: GASTROCNEMIUS RECESSION;  Surgeon: Newt Minion, MD;  Location: [place];  Service: Orthopedics;  Laterality: Right;  . Joint fusion-foot     x2 LEFT FOOT  . KNEE ARTHROPLASTY  2001   Right  . KNEE ARTHROPLASTY  2002   Left  . Left leg vein resection    . ROTATOR CUFF REPAIR     LEFT  . THYROIDECTOMY  1980   3/4  . TONSILLECTOMY    . TUBAL LIGATION    . WEIL OSTEOTOMY Right 08/07/2016   Procedure: WEIL OSTEOTOMY 2nd and 3rd Metatarsal Right Foot;  Surgeon: Newt Minion, MD;  Location: [place];  Service: Orthopedics;  Laterality: Right;  . WEIL OSTEOTOMY Left 12/25/2016   Procedure: 2nd Toe Proximal Interphalangeal Resection, Weil Osteotomy 2nd and 3rd Metatarsal Left Foot;  Surgeon: Newt Minion, MD;  Location: [place];  Service: Orthopedics;  Laterality: Left;    Current Outpatient Medications  Medication Sig Dispense Refill  . amLODipine (NORVASC) 10 MG tablet Take 10 mg by mouth every morning.     Marland Kitchen aspirin EC 81 MG tablet Take 81 mg by mouth every evening.    . docusate sodium (COLACE) 100 MG capsule Take 100 mg by mouth at bedtime.    Marland Kitchen FLUoxetine (PROZAC) 20 MG capsule Take 20 mg by mouth every morning.     . furosemide (LASIX) 40 MG tablet Take 40 mg by mouth daily.    Marland Kitchen ibuprofen (ADVIL,MOTRIN) 200 MG tablet Take 400 mg by mouth at bedtime.     Javier Docker Oil 350 MG CAPS Take 350 mg by mouth daily.     Marland Kitchen levothyroxine (SYNTHROID, LEVOTHROID) 100 MCG tablet Take 100 mcg by mouth daily before breakfast.     .  loratadine (CLARITIN) 10 MG tablet Take 10 mg by mouth daily as needed for allergies.     Marland Kitchen losartan (COZAAR) 100 MG tablet Take 1 tablet (100 mg total) by mouth every evening.    . meclizine (ANTIVERT) 25 MG tablet Take 25 mg by mouth 3 (three) times daily as needed for dizziness.     . metFORMIN (GLUCOPHAGE-XR) 500 MG 24 hr tablet Take 500 mg by mouth daily.    . metoprolol succinate (TOPROL-XL) 100 MG 24 hr tablet Take 100 mg by mouth every morning. Take with or immediately following a meal.    . omeprazole (PRILOSEC) 20 MG capsule Take 20 mg by mouth daily before breakfast.    . simvastatin (ZOCOR) 40 MG tablet Take 40 mg by mouth at bedtime.    . triamcinolone cream (KENALOG) 0.5 % Apply 1 application topically 3 (three) times daily as needed. For rash/irritation.    . Vitamin D, Cholecalciferol, 1000 UNITS CAPS Take 1,000 mg by mouth 2 (two) times daily.     No current facility-administered medications for this visit.    Allergies:  Ephedrine; Hibiclens [chlorhexidine gluconate]; and Keflex [cephalexin]   Social History: The patient  reports that she has never smoked. She has never used smokeless tobacco. She reports that she drinks alcohol. She reports that she does not use drugs.   ROS:  Please see the history of present illness. Otherwise, complete review of systems is positive for chronic mild dyspnea on exertion.  All other systems are reviewed and negative.   Physical Exam: VS:  BP 118/60   Pulse 68   Ht 5\' 5"  (1.651 m)   Wt 263 lb 9.6 oz (119.6 kg)   SpO2 96%   BMI 43.87 kg/m , BMI Body mass index is 43.87 kg/m.  Wt Readings from Last 3 Encounters:  04/15/18 263 lb 9.6 oz (119.6 kg)  02/21/17 267 lb (121.1 kg)  01/22/17 265 lb (120.2 kg)    General: Patient appears comfortable at rest. HEENT: Conjunctiva and lids normal, oropharynx clear. Neck: Supple, no elevated JVP or carotid bruits, no thyromegaly. Lungs: Clear to auscultation, nonlabored breathing at  rest. Cardiac: Regular rate and rhythm, no S3 or significant systolic murmur. Abdomen: Soft, nontender, bowel sounds present. Extremities: No pitting edema, distal pulses 2+. Skin: Warm and dry. Musculoskeletal: No kyphosis. Neuropsychiatric: Alert and oriented x3, affect grossly appropriate.  ECG: I personally reviewed the tracing from 02/21/2017 which showed sinus rhythm with borderline low voltage and decreased R wave progression.  Recent Labwork:  12/20/2016: BUN 20; Creatinine, Ser 1.12; Hemoglobin 14.4; Platelets 243; Potassium 4.3; Sodium 140   Other Studies Reviewed Today:  Echocardiogram August 2013: Moderate  LVH with LVEF of 95-32%, grade 1 diastolic dysfunction, paradoxical septal motion, mild left atrial enlargement. No major abnormalities.   [place] 2013: No significant ST segment changes and overall normal perfusion, no clear evidence of ischemia.  Assessment and Plan:  1.  Intermittent palpitations as outlined above.  We will obtain a 7-day cardiac monitor to further investigate, mainly to exclude paroxysmal atrial fibrillation.  She has no prior history of cardiac arrhythmia.  2.  Previously documented intermittent left bundle branch block, potentially heart rate related.  ECG reviewed today and stable.  She has had no syncope.  LVEF normal by prior assessment.  3.  Essential hypertension, blood pressure is normal today.  Current medicines were reviewed with the patient today.   Orders Placed This Encounter  Procedures  . Cardiac event monitor  . EKG 12-Lead    Disposition: Call with test results.  Signed, Satira Sark, MD, [facility] 04/15/2018 4:01 PM    [facility] [place], [place], [place] [address] Phone: [phone]; Fax: [phone]

## 2018-04-15 NOTE — Patient Instructions (Signed)
Medication Instructions:  Your physician recommends that you continue on your current medications as directed. Please refer to the Current Medication list given to you today.  Labwork: NONE  Testing/Procedures: Your physician has recommended that you wear an event monitor FOR 7 DAYS. Event monitors are medical devices that record the heart's electrical activity. Doctors most often [place] these monitors to diagnose arrhythmias. Arrhythmias are problems with the speed or rhythm of the heartbeat. The monitor is a small, portable device. You can wear one while you do your normal daily activities. This is usually used to diagnose what is causing palpitations/syncope (passing out).  Follow-Up: Your physician wants you to follow-up in: [place]. You will receive a reminder letter in the mail two months in advance. If you don't receive a letter, please call our office to schedule the follow-up appointment.  Any Other Special Instructions Will Be Listed Below (If Applicable).  If you need a refill on your cardiac medications before your next appointment, please call your pharmacy.

## 2018-04-19 DIAGNOSIS — R002 Palpitations: Secondary | ICD-10-CM | POA: Diagnosis not present

## 2018-04-20 ENCOUNTER — Telehealth: Payer: Self-pay | Admitting: Interventional Cardiology

## 2018-04-20 DIAGNOSIS — I447 Left bundle-branch block, unspecified: Secondary | ICD-10-CM

## 2018-04-20 NOTE — Telephone Encounter (Signed)
Called by [employer] about abnormal Holter finding at 10:52 AM. Patient had an 8 beat run of VTACH, rate around 160. She was feeling fine afterwards but did report a "fluttering" sensation. Will route to primary cardiology team.   Grace Delacruz Self

## 2018-04-21 NOTE — Telephone Encounter (Signed)
Will ask nursing to pull strips for review.

## 2018-04-22 NOTE — Addendum Note (Signed)
Addended by: Merlene Laughter on: 04/22/2018 08:44 AM   Modules accepted: Orders

## 2018-04-22 NOTE — Telephone Encounter (Signed)
Per Dr. Domenic Polite, after review of available event results, continue to monitor cardiac event results and order 2 D Echo dx: f/u LVEF.  Patient informed and verbalized understanding of plan.

## 2018-04-27 ENCOUNTER — Encounter (INDEPENDENT_AMBULATORY_CARE_PROVIDER_SITE_OTHER): Payer: Medicare Other

## 2018-04-27 DIAGNOSIS — R002 Palpitations: Secondary | ICD-10-CM | POA: Diagnosis not present

## 2018-04-30 ENCOUNTER — Telehealth: Payer: Self-pay | Admitting: Cardiology

## 2018-04-30 ENCOUNTER — Telehealth: Payer: Self-pay

## 2018-04-30 DIAGNOSIS — I471 Supraventricular tachycardia: Secondary | ICD-10-CM

## 2018-04-30 DIAGNOSIS — I10 Essential (primary) hypertension: Secondary | ICD-10-CM

## 2018-04-30 NOTE — Telephone Encounter (Signed)
Patient notified. Stress test scheduled.

## 2018-04-30 NOTE — Telephone Encounter (Signed)
-----   Message from Satira Sark, MD sent at 04/28/2018  8:19 AM EDT ----- Results reviewed.  Brief episodes of NSVT noted.  Importantly there were no sustained arrhythmias however.  We already planned on getting a follow-up echocardiogram to reassess LVEF.  Last stress test was in 2013 so I would go ahead and get a follow-up Lexiscan Myoview as well.  Please schedule an office visit with me to go over these tests with her. A copy of this test should be forwarded to Rory Percy, MD.

## 2018-04-30 NOTE — Telephone Encounter (Signed)
Pre-cert Verification for the following procedure   Lexiscan scheduled for 05-06-18 at [facility]

## 2018-05-04 DIAGNOSIS — L03115 Cellulitis of right lower limb: Secondary | ICD-10-CM | POA: Diagnosis not present

## 2018-05-04 DIAGNOSIS — Z6841 Body Mass Index (BMI) 40.0 and over, adult: Secondary | ICD-10-CM | POA: Diagnosis not present

## 2018-05-05 ENCOUNTER — Telehealth: Payer: Self-pay | Admitting: Cardiology

## 2018-05-05 NOTE — Telephone Encounter (Signed)
Patient informed that she would not have to walk on a treadmill and that having her stress test tomorrow should be fine. Verbalized understanding.

## 2018-05-05 NOTE — Telephone Encounter (Signed)
Been experiencing vertigo and wanted to know what she should do able stress test scheduled tomorrow

## 2018-05-06 ENCOUNTER — Encounter (HOSPITAL_BASED_OUTPATIENT_CLINIC_OR_DEPARTMENT_OTHER)
Admission: RE | Admit: 2018-05-06 | Discharge: 2018-05-06 | Disposition: A | Discharge status: Home / Self Care | Payer: Medicare Other | Source: Ambulatory Visit | Attending: Cardiology | Admitting: Cardiology

## 2018-05-06 ENCOUNTER — Encounter (HOSPITAL_COMMUNITY): Payer: Self-pay

## 2018-05-06 ENCOUNTER — Encounter (HOSPITAL_COMMUNITY)
Admission: RE | Admit: 2018-05-06 | Discharge: 2018-05-06 | Disposition: A | Discharge status: Home / Self Care | Payer: Medicare Other | Source: Ambulatory Visit | Attending: Cardiology | Admitting: Cardiology

## 2018-05-06 DIAGNOSIS — I471 Supraventricular tachycardia: Secondary | ICD-10-CM

## 2018-05-06 DIAGNOSIS — I10 Essential (primary) hypertension: Secondary | ICD-10-CM

## 2018-05-06 LAB — NM MYOCAR MULTI W/SPECT W/WALL MOTION / EF
CHL CUP NUCLEAR SDS: 3
CHL CUP NUCLEAR SRS: 2
CHL CUP NUCLEAR SSS: 5
CHL CUP RESTING HR STRESS: 63 {beats}/min
LV dias vol: 101 mL (ref 46–106)
LV sys vol: 47 mL
Peak HR: 86 {beats}/min
RATE: 0.31
TID: 1.25

## 2018-05-06 MED ORDER — TECHNETIUM TC 99M TETROFOSMIN IV KIT
30.0000 | PACK | Freq: Once | INTRAVENOUS | Status: AC | PRN
  Administered 2018-05-06: 32 via INTRAVENOUS

## 2018-05-06 MED ORDER — TECHNETIUM TC 99M TETROFOSMIN IV KIT
10.0000 | PACK | Freq: Once | INTRAVENOUS | Status: AC | PRN
  Administered 2018-05-06: 11 via INTRAVENOUS

## 2018-05-06 MED ORDER — SODIUM CHLORIDE 0.9% FLUSH
INTRAVENOUS | Status: AC
  Administered 2018-05-06: 10 mL via INTRAVENOUS
  Filled 2018-05-06: qty 10

## 2018-05-06 MED ORDER — REGADENOSON 0.4 MG/5ML IV SOLN
INTRAVENOUS | Status: AC
  Administered 2018-05-06: 0.4 mg via INTRAVENOUS
  Filled 2018-05-06: qty 5

## 2018-05-07 DIAGNOSIS — T8522XS Displacement of intraocular lens, sequela: Secondary | ICD-10-CM | POA: Diagnosis not present

## 2018-05-07 DIAGNOSIS — I10 Essential (primary) hypertension: Secondary | ICD-10-CM | POA: Diagnosis not present

## 2018-05-07 DIAGNOSIS — Z7984 Long term (current) use of oral hypoglycemic drugs: Secondary | ICD-10-CM | POA: Diagnosis not present

## 2018-05-07 DIAGNOSIS — Z961 Presence of intraocular lens: Secondary | ICD-10-CM | POA: Diagnosis not present

## 2018-05-07 DIAGNOSIS — H35033 Hypertensive retinopathy, bilateral: Secondary | ICD-10-CM | POA: Diagnosis not present

## 2018-05-07 DIAGNOSIS — Z9842 Cataract extraction status, left eye: Secondary | ICD-10-CM | POA: Diagnosis not present

## 2018-05-07 DIAGNOSIS — E119 Type 2 diabetes mellitus without complications: Secondary | ICD-10-CM | POA: Diagnosis not present

## 2018-05-07 DIAGNOSIS — H35373 Puckering of macula, bilateral: Secondary | ICD-10-CM | POA: Diagnosis not present

## 2018-05-07 DIAGNOSIS — H35371 Puckering of macula, right eye: Secondary | ICD-10-CM | POA: Diagnosis not present

## 2018-05-07 DIAGNOSIS — H524 Presbyopia: Secondary | ICD-10-CM | POA: Diagnosis not present

## 2018-05-07 DIAGNOSIS — Z9841 Cataract extraction status, right eye: Secondary | ICD-10-CM | POA: Diagnosis not present

## 2018-05-07 DIAGNOSIS — H43393 Other vitreous opacities, bilateral: Secondary | ICD-10-CM | POA: Diagnosis not present

## 2018-05-08 ENCOUNTER — Telehealth: Payer: Self-pay | Admitting: *Deleted

## 2018-05-08 NOTE — Telephone Encounter (Signed)
-----   Message from Satira Sark, MD sent at 05/08/2018  4:39 PM EDT ----- Results reviewed. I also reviewed the study images. LVEF is normal by both echocardiogram and Myoview. The question of an interval infarct scar is raised, but this finding could also be due to variable soft tissue attenuation (artifact) as well. Would continue with current medication and we can discuss things further at pending office visit. A copy of this test should be forwarded to Rory Percy, MD.

## 2018-05-08 NOTE — Telephone Encounter (Signed)
Patient informed and copy sent to PCP. 

## 2018-05-20 ENCOUNTER — Other Ambulatory Visit: Payer: Self-pay

## 2018-05-20 ENCOUNTER — Ambulatory Visit (INDEPENDENT_AMBULATORY_CARE_PROVIDER_SITE_OTHER): Payer: Medicare Other

## 2018-05-20 DIAGNOSIS — I447 Left bundle-branch block, unspecified: Secondary | ICD-10-CM

## 2018-06-08 NOTE — Progress Notes (Deleted)
Cardiology Office Note  Date: 06/08/2018   ID: Grace Delacruz, DOB 04-29-1946, MRN [id number]  PCP: Rory Percy, MD  Primary Cardiologist: Rozann Lesches, MD   No chief complaint on file.   History of Present Illness: Grace Delacruz is a 72 y.o. female last seen in June.  Since last assessment she has undergone interval cardiac testing including a cardiac monitor for evaluation of palpitations as well as structural and ischemic evaluations.  Cardiac monitor did demonstrate brief episodes of NSVT, but no sustained arrhythmias.  Echocardiogram demonstrated normal LVEF at 55 to 60% without regional wall motion abnormalities.  Myoview raised the possibility of an interval anterior/anteroseptal infarct scar with mild peri-infarct ischemia, although soft tissue attenuation could also be a contributor.  Past Medical History:  Diagnosis Date  . Anxiety   . Depression   . DJD (degenerative joint disease)   . Esophageal reflux   . Essential hypertension, benign   . Fibrocystic breast disease   . Hypothyroidism   . Left bundle branch block    Transient - possibly rate related   . Menopause   . Osteoarthritis   . PONV (postoperative nausea and vomiting)    pt. would like to have a scopolamine patch  . Restless leg syndrome   . Scoliosis   . Type 2 diabetes mellitus ([place])     Past Surgical History:  Procedure Laterality Date  . BREAST BIOPSY     lt x3  . CARPOMETACARPEL SUSPENSION PLASTY Left 03/08/2014   Procedure: LEFT THUMB CARPOMETACARPEL ([place] [place]) SUSPENSION PLASTY WITH TRAPEZIUM EXCISION;  Surgeon: [name]., MD;  Location: [place];  Service: Orthopedics;  Laterality: Left;  . CATARACT EXTRACTION Bilateral   . CESAREAN SECTION    . CHOLECYSTECTOMY    . COLONOSCOPY  03/26/2007   RMR:  Diffuse changes of proctocolitis as described above diminutive rectal polyp, cold biopsy/removed, pedunculated polyp of the splenic flexure hot snare removed.   Segmental biopsies the colon taken of the ileocecal valve ulcer biopsied normal terminal ileum  . COLONOSCOPY N/A 01/05/2014   Procedure: COLONOSCOPY;  Surgeon: Rogene Houston, MD;  [facility];  Service: Endoscopy;  Laterality: N/A;  830  . EYE SURGERY    . GASTROCNEMIUS RECESSION Right 08/07/2016   Procedure: GASTROCNEMIUS RECESSION;  Surgeon: Newt Minion, MD;  Location: [place];  Service: Orthopedics;  Laterality: Right;  . Joint fusion-foot     x2 LEFT FOOT  . KNEE ARTHROPLASTY  2001   Right  . KNEE ARTHROPLASTY  2002   Left  . Left leg vein resection    . ROTATOR CUFF REPAIR     LEFT  . THYROIDECTOMY  1980   3/4  . TONSILLECTOMY    . TUBAL LIGATION    . WEIL OSTEOTOMY Right 08/07/2016   Procedure: WEIL OSTEOTOMY 2nd and 3rd Metatarsal Right Foot;  Surgeon: Newt Minion, MD;  Location: [place];  Service: Orthopedics;  Laterality: Right;  . WEIL OSTEOTOMY Left 12/25/2016   Procedure: 2nd Toe Proximal Interphalangeal Resection, Weil Osteotomy 2nd and 3rd Metatarsal Left Foot;  Surgeon: Newt Minion, MD;  Location: [place];  Service: Orthopedics;  Laterality: Left;    Current Outpatient Medications  Medication Sig Dispense Refill  . amLODipine (NORVASC) 10 MG tablet Take 10 mg by mouth every morning.     Marland Kitchen aspirin EC 81 MG tablet Take 81 mg by mouth every evening.    . docusate sodium (COLACE)  100 MG capsule Take 100 mg by mouth at bedtime.    Marland Kitchen FLUoxetine (PROZAC) 20 MG capsule Take 20 mg by mouth every morning.     . furosemide (LASIX) 40 MG tablet Take 40 mg by mouth daily.    Marland Kitchen ibuprofen (ADVIL,MOTRIN) 200 MG tablet Take 400 mg by mouth at bedtime.     Javier Docker Oil 350 MG CAPS Take 350 mg by mouth daily.     Marland Kitchen levothyroxine (SYNTHROID, LEVOTHROID) 100 MCG tablet Take 100 mcg by mouth daily before breakfast.     . loratadine (CLARITIN) 10 MG tablet Take 10 mg by mouth daily as needed for allergies.     Marland Kitchen losartan (COZAAR) 100 MG tablet Take 1 tablet (100 mg total) by  mouth every evening.    . meclizine (ANTIVERT) 25 MG tablet Take 25 mg by mouth 3 (three) times daily as needed for dizziness.     . metFORMIN (GLUCOPHAGE-XR) 500 MG 24 hr tablet Take 500 mg by mouth daily.    . metoprolol succinate (TOPROL-XL) 100 MG 24 hr tablet Take 100 mg by mouth every morning. Take with or immediately following a meal.    . omeprazole (PRILOSEC) 20 MG capsule Take 20 mg by mouth daily before breakfast.    . simvastatin (ZOCOR) 40 MG tablet Take 40 mg by mouth at bedtime.    . triamcinolone cream (KENALOG) 0.5 % Apply 1 application topically 3 (three) times daily as needed. For rash/irritation.    . Vitamin D, Cholecalciferol, 1000 UNITS CAPS Take 1,000 mg by mouth 2 (two) times daily.     No current facility-administered medications for this visit.    Allergies:  Ephedrine; Hibiclens [chlorhexidine gluconate]; and Keflex [cephalexin]   Social History: The patient  reports that she has never smoked. She has never used smokeless tobacco. She reports that she drinks alcohol. She reports that she does not use drugs.   Family History: The patient's family history includes Arthritis in her father; Asthma in her paternal grandfather; Diabetes in her father; Emphysema in her paternal grandfather; Heart failure in her father; Hypertension in her father and mother.   ROS:  Please see the history of present illness. Otherwise, complete review of systems is positive for {NONE DEFAULTED:18576::"none"}.  All other systems are reviewed and negative.   Physical Exam: VS:  There were no vitals taken for this visit., BMI There is no height or weight on file to calculate BMI.  Wt Readings from Last 3 Encounters:  04/15/18 263 lb 9.6 oz (119.6 kg)  02/21/17 267 lb (121.1 kg)  01/22/17 265 lb (120.2 kg)    General: Patient appears comfortable at rest. HEENT: Conjunctiva and lids normal, oropharynx clear with moist mucosa. Neck: Supple, no elevated JVP or carotid bruits, no  thyromegaly. Lungs: Clear to auscultation, nonlabored breathing at rest. Cardiac: Regular rate and rhythm, no S3 or significant systolic murmur, no pericardial rub. Abdomen: Soft, nontender, no hepatomegaly, bowel sounds present, no guarding or rebound. Extremities: No pitting edema, distal pulses 2+. Skin: Warm and dry. Musculoskeletal: No kyphosis. Neuropsychiatric: Alert and oriented x3, affect grossly appropriate.  ECG: I personally reviewed the tracing from 04/15/2018 which showed sinus rhythm.  Recent Labwork:  12/20/2016: BUN 20; Creatinine, Ser 1.12; Hemoglobin 14.4; Platelets 243; Potassium 4.3; Sodium 140  Other Studies Reviewed Today:  Event monitor 04/27/2018: Available strips from 7-day event recorder reviewed.  Sinus rhythm is noted throughout.  Intermittent IVCD noted, patient has history of intermittent left bundle branch  block.  Heart rate ranged from 52 bpm up to 90 bpm with average heart rate 66 bpm.  There were very rare PVCs overall.  There was an 8 beat run of NSVT noted on June 30 at 10:22 PM and also July 1 at 10:52 AM.  There was a 4 beat run of NSVT noted on July 5 at 5:11 PM.  There were no sustained arrhythmias or pauses.  Echocardiogram 05/20/2018: Study Conclusions  - Left ventricle: The cavity size was normal. Wall thickness was   increased in a pattern of mild LVH. Systolic function was normal.   The estimated ejection fraction was in the range of 55% to 60%.   Wall motion was normal; there were no regional wall motion   abnormalities. Doppler parameters are consistent with abnormal   left ventricular relaxation (grade 1 diastolic dysfunction). - Aortic valve: Mildly calcified annulus. Trileaflet; mildly   calcified leaflets. - Mitral valve: Mildly calcified annulus. - Left atrium: The atrium was at the upper limits of normal in   size. - Right atrium: Central venous pressure (est): 3 mm Hg. - Atrial septum: No defect or patent foramen ovale was  identified. - Tricuspid valve: There was trivial regurgitation. - Pulmonary arteries: PA peak pressure: 12 mm Hg (S). - Pericardium, extracardiac: There was no pericardial effusion.  Lexiscan Myoview 05/06/2018:  There was no ST segment deviation noted during stress.  Findings consistent with prior myocardial anterior/anteroseptal infarction with mild peri-infarct ischemia.  This is a low risk study.  The left ventricular ejection fraction is normal (55-65%).  Assessment and Plan:    Current medicines were reviewed with the patient today.  No orders of the defined types were placed in this encounter.   Disposition:  Signed, Satira Sark, MD, [facility] 06/08/2018 2:44 PM    [facility] [place], [place], [place] [address] Phone: [phone]; Fax: [phone]

## 2018-06-09 ENCOUNTER — Ambulatory Visit: Payer: Medicare Other | Admitting: Cardiology

## 2018-06-09 NOTE — Progress Notes (Signed)
Cardiology Office Note  Date: 06/10/2018   ID: [place], Doster 1946/03/05, MRN [id number]  PCP: Rory Percy, MD  Primary Cardiologist: Rozann Lesches, MD   Chief Complaint  Patient presents with  . Cardiac follow-up     History of Present Illness: Grace Delacruz is a 72 y.o. female last seen in June.  She is here today with her brother for a follow-up visit.  She tells me that she continues to have intermittent palpitations, no dizziness or syncope.  She has been considering weight loss, started back walking for exercise, but has been very short of breath even with short distances, no definite chest tightness.  Since last assessment she has undergone interval cardiac testing including a cardiac monitor for evaluation of palpitations as well as structural and ischemic evaluations.  Cardiac monitor did demonstrate brief episodes of NSVT, but no sustained arrhythmias.  Echocardiogram demonstrated normal LVEF at 55 to 60% without regional wall motion abnormalities.  Myoview raised the possibility of an interval anterior/anteroseptal infarct scar with mild peri-infarct ischemia, although soft tissue attenuation could also be a contributor.  I reviewed the results of her testing in detail.  Since she continues to experience palpitations and also dyspnea on exertion, clear evaluation of coronary anatomy is indicated.  We reviewed the risk and benefits of a diagnostic cardiac catheterization and she is in agreement to proceed.  In the meanwhile we will plan to continue medical therapy which includes high-dose beta-blocker along with aspirin and statin.  Past Medical History:  Diagnosis Date  . Anxiety   . Depression   . DJD (degenerative joint disease)   . Esophageal reflux   . Essential hypertension, benign   . Fibrocystic breast disease   . Hypothyroidism   . Left bundle branch block    Transient - possibly rate related   . Menopause   . Osteoarthritis   . PONV (postoperative  nausea and vomiting)    pt. would like to have a scopolamine patch  . Restless leg syndrome   . Scoliosis   . Type 2 diabetes mellitus ([place])     Past Surgical History:  Procedure Laterality Date  . BREAST BIOPSY     lt x3  . CARPOMETACARPEL SUSPENSION PLASTY Left 03/08/2014   Procedure: LEFT THUMB CARPOMETACARPEL ([place]) SUSPENSION PLASTY WITH TRAPEZIUM EXCISION;  Surgeon: [name]., MD;  Location: [facility];  Service: Orthopedics;  Laterality: Left;  . CATARACT EXTRACTION Bilateral   . CESAREAN SECTION    . CHOLECYSTECTOMY    . COLONOSCOPY  03/26/2007   RMR:  Diffuse changes of proctocolitis as described above diminutive rectal polyp, cold biopsy/removed, pedunculated polyp of the splenic flexure hot snare removed.  Segmental biopsies the colon taken of the ileocecal valve ulcer biopsied normal terminal ileum  . COLONOSCOPY N/A 01/05/2014   Procedure: COLONOSCOPY;  Surgeon: Rogene Houston, MD;  [facility];  Service: Endoscopy;  Laterality: N/A;  830  . EYE SURGERY    . GASTROCNEMIUS RECESSION Right 08/07/2016   Procedure: GASTROCNEMIUS RECESSION;  Surgeon: Newt Minion, MD;  Location: [facility];  Service: Orthopedics;  Laterality: Right;  . Joint fusion-foot     x2 LEFT FOOT  . KNEE ARTHROPLASTY  2001   Right  . KNEE ARTHROPLASTY  2002   Left  . Left leg vein resection    . ROTATOR CUFF REPAIR     LEFT  . THYROIDECTOMY  1980   3/4  .  TONSILLECTOMY    . TUBAL LIGATION    . WEIL OSTEOTOMY Right 08/07/2016   Procedure: WEIL OSTEOTOMY 2nd and 3rd Metatarsal Right Foot;  Surgeon: Newt Minion, MD;  Location: [facility];  Service: Orthopedics;  Laterality: Right;  . WEIL OSTEOTOMY Left 12/25/2016   Procedure: 2nd Toe Proximal Interphalangeal Resection, Weil Osteotomy 2nd and 3rd Metatarsal Left Foot;  Surgeon: Newt Minion, MD;  Location: [facility];  Service: Orthopedics;  Laterality: Left;    Current Outpatient Medications  Medication Sig Dispense Refill   . amLODipine (NORVASC) 10 MG tablet Take 10 mg by mouth every morning.     Marland Kitchen aspirin EC 81 MG tablet Take 81 mg by mouth every evening.    . docusate sodium (COLACE) 100 MG capsule Take 100 mg by mouth at bedtime.    Marland Kitchen FLUoxetine (PROZAC) 20 MG capsule Take 20 mg by mouth every morning.     . furosemide (LASIX) 40 MG tablet Take 40 mg by mouth daily.    Marland Kitchen ibuprofen (ADVIL,MOTRIN) 200 MG tablet Take 400 mg by mouth at bedtime.     Javier Docker Oil 350 MG CAPS Take 350 mg by mouth daily.     Marland Kitchen levothyroxine (SYNTHROID, LEVOTHROID) 100 MCG tablet Take 100 mcg by mouth daily before breakfast.     . loratadine (CLARITIN) 10 MG tablet Take 10 mg by mouth daily as needed for allergies.     Marland Kitchen losartan (COZAAR) 100 MG tablet Take 1 tablet (100 mg total) by mouth every evening.    . meclizine (ANTIVERT) 25 MG tablet Take 25 mg by mouth 3 (three) times daily as needed for dizziness.     . metFORMIN (GLUCOPHAGE-XR) 500 MG 24 hr tablet Take 500 mg by mouth daily.    . metoprolol succinate (TOPROL-XL) 100 MG 24 hr tablet Take 100 mg by mouth every morning. Take with or immediately following a meal.    . omeprazole (PRILOSEC) 20 MG capsule Take 20 mg by mouth daily before breakfast.    . simvastatin (ZOCOR) 40 MG tablet Take 40 mg by mouth at bedtime.    . triamcinolone cream (KENALOG) 0.5 % Apply 1 application topically 3 (three) times daily as needed. For rash/irritation.    . Vitamin D, Cholecalciferol, 1000 UNITS CAPS Take 1,000 mg by mouth 2 (two) times daily.     No current facility-administered medications for this visit.    Allergies:  Ephedrine; Hibiclens [chlorhexidine gluconate]; and Keflex [cephalexin]   Social History: The patient  reports that she has never smoked. She has never used smokeless tobacco. She reports that she drinks alcohol. She reports that she does not use drugs.   Family History: The patient's family history includes Arthritis in her father; Asthma in her paternal grandfather;  Diabetes in her father; Emphysema in her paternal grandfather; Heart failure in her father; Hypertension in her father and mother.   ROS:  Please see the history of present illness. Otherwise, complete review of systems is positive for none.  All other systems are reviewed and negative.   Physical Exam: VS:  BP 135/79   Pulse 69   Ht 5\' 5"  (1.651 m)   Wt 262 lb 3.2 oz (118.9 kg)   SpO2 96%   BMI 43.63 kg/m , BMI Body mass index is 43.63 kg/m.  Wt Readings from Last 3 Encounters:  06/10/18 262 lb 3.2 oz (118.9 kg)  04/15/18 263 lb 9.6 oz (119.6 kg)  02/21/17 267 lb (121.1 kg)  General: Patient appears comfortable at rest. HEENT: Conjunctiva and lids normal, oropharynx clear. Neck: Supple, no elevated JVP or carotid bruits, no thyromegaly. Lungs: Clear to auscultation, nonlabored breathing at rest. Cardiac: Regular rate and rhythm, no S3 or significant systolic murmur. Abdomen: Soft, nontender, bowel sounds present, no guarding or rebound. Extremities: No pitting edema, distal pulses 2+. Skin: Warm and dry. Musculoskeletal: No kyphosis. Neuropsychiatric: Alert and oriented x3, affect grossly appropriate.  ECG: I personally reviewed the tracing from 04/15/2018 which showed sinus rhythm.  Recent Labwork:  12/20/2016: BUN 20; Creatinine, Ser 1.12; Hemoglobin 14.4; Platelets 243; Potassium 4.3; Sodium 140  Other Studies Reviewed Today:  Event monitor 04/27/2018: Available strips from 7-day event recorder reviewed.  Sinus rhythm is noted throughout.  Intermittent IVCD noted, patient has history of intermittent left bundle branch block.  Heart rate ranged from 52 bpm up to 90 bpm with average heart rate 66 bpm.  There were very rare PVCs overall.  There was an 8 beat run of NSVT noted on June 30 at 10:22 PM and also July 1 at 10:52 AM.  There was a 4 beat run of NSVT noted on July 5 at 5:11 PM.  There were no sustained arrhythmias or pauses.  Echocardiogram 05/20/2018: Study  Conclusions  - Left ventricle: The cavity size was normal. Wall thickness was   increased in a pattern of mild LVH. Systolic function was normal.   The estimated ejection fraction was in the range of 55% to 60%.   Wall motion was normal; there were no regional wall motion   abnormalities. Doppler parameters are consistent with abnormal   left ventricular relaxation (grade 1 diastolic dysfunction). - Aortic valve: Mildly calcified annulus. Trileaflet; mildly   calcified leaflets. - Mitral valve: Mildly calcified annulus. - Left atrium: The atrium was at the upper limits of normal in   size. - Right atrium: Central venous pressure (est): 3 mm Hg. - Atrial septum: No defect or patent foramen ovale was identified. - Tricuspid valve: There was trivial regurgitation. - Pulmonary arteries: PA peak pressure: 12 mm Hg (S). - Pericardium, extracardiac: There was no pericardial effusion.  Lexiscan Myoview 05/06/2018:  There was no ST segment deviation noted during stress.  Findings consistent with prior myocardial anterior/anteroseptal infarction with mild peri-infarct ischemia.  This is a low risk study.  The left ventricular ejection fraction is normal (55-65%).  Assessment and Plan:  1.  Intermittent palpitations, documented PVCs and a few episodes of NSVT by recent cardiac monitor.  She has had no syncope.  LVEF is normal by echocardiogram.  She remains on high-dose Toprol-XL.  2.  Dyspnea on exertion, abnormal Myoview showing anterior/anteroseptal infarct with peri-infarct ischemia, although soft tissue attenuation is also possible.  She did not have a focal wall motion normality by echocardiogram.  In light of her symptoms and also NSVT on monitor, we discussed proceeding with a diagnostic cardiac catheterization.  After reviewing the risks and benefits she is in agreement to proceed.  3.  Essential hypertension, pressure is reasonably controlled today.  No changes made today.  4.   Intermittent left bundle branch block noted over the years, previous ischemic work-up was negative.  5.  Type 2 diabetes mellitus, currently on Glucophage XR follow-up per Dr. Nadara Mustard.  Current medicines were reviewed with the patient today.   Orders Placed This Encounter  Procedures  . DG Chest 2 View  . Basic metabolic panel  . CBC    Disposition: Follow-up after procedure.  Signed,  Satira Sark, MD, [facility] 06/10/2018 2:28 PM    [facility] [place], [place], [place] [address] Phone: [phone]; Fax: [phone]

## 2018-06-09 NOTE — H&P (View-Only) (Signed)
Cardiology Office Note  Date: 06/10/2018   ID: [place], [place] 10/28/1945, MRN [id number]  PCP: Rory Percy, MD  Primary Cardiologist: Rozann Lesches, MD   Chief Complaint  Patient presents with  . Cardiac follow-up     History of Present Illness: [place] is a 72 y.o. female last seen in June.  She is here today with her brother for a follow-up visit.  She tells me that she continues to have intermittent palpitations, no dizziness or syncope.  She has been considering weight loss, started back walking for exercise, but has been very short of breath even with short distances, no definite chest tightness.  Since last assessment she has undergone interval cardiac testing including a cardiac monitor for evaluation of palpitations as well as structural and ischemic evaluations.  Cardiac monitor did demonstrate brief episodes of NSVT, but no sustained arrhythmias.  Echocardiogram demonstrated normal LVEF at 55 to 60% without regional wall motion abnormalities.  Myoview raised the possibility of an interval anterior/anteroseptal infarct scar with mild peri-infarct ischemia, although soft tissue attenuation could also be a contributor.  I reviewed the results of her testing in detail.  Since she continues to experience palpitations and also dyspnea on exertion, clear evaluation of coronary anatomy is indicated.  We reviewed the risk and benefits of a diagnostic cardiac catheterization and she is in agreement to proceed.  In the meanwhile we will plan to continue medical therapy which includes high-dose beta-blocker along with aspirin and statin.  Past Medical History:  Diagnosis Date  . Anxiety   . Depression   . DJD (degenerative joint disease)   . Esophageal reflux   . Essential hypertension, benign   . Fibrocystic breast disease   . Hypothyroidism   . Left bundle branch block    Transient - possibly rate related   . Menopause   . Osteoarthritis   . PONV (postoperative  nausea and vomiting)    pt. would like to have a scopolamine patch  . Restless leg syndrome   . Scoliosis   . Type 2 diabetes mellitus ([place])     Past Surgical History:  Procedure Laterality Date  . BREAST BIOPSY     lt x3  . CARPOMETACARPEL SUSPENSION PLASTY Left 03/08/2014   Procedure: LEFT THUMB CARPOMETACARPEL ([place]) SUSPENSION PLASTY WITH TRAPEZIUM EXCISION;  Surgeon: [name]., MD;  Location: [facility];  Service: Orthopedics;  Laterality: Left;  . CATARACT EXTRACTION Bilateral   . CESAREAN SECTION    . CHOLECYSTECTOMY    . COLONOSCOPY  03/26/2007   RMR:  Diffuse changes of proctocolitis as described above diminutive rectal polyp, cold biopsy/removed, pedunculated polyp of the splenic flexure hot snare removed.  Segmental biopsies the colon taken of the ileocecal valve ulcer biopsied normal terminal ileum  . COLONOSCOPY N/A 01/05/2014   Procedure: COLONOSCOPY;  Surgeon: Rogene Houston, MD;  [facility];  Service: Endoscopy;  Laterality: N/A;  830  . EYE SURGERY    . GASTROCNEMIUS RECESSION Right 08/07/2016   Procedure: GASTROCNEMIUS RECESSION;  Surgeon: Newt Minion, MD;  Location: [facility];  Service: Orthopedics;  Laterality: Right;  . Joint fusion-foot     x2 LEFT FOOT  . KNEE ARTHROPLASTY  2001   Right  . KNEE ARTHROPLASTY  2002   Left  . Left leg vein resection    . ROTATOR CUFF REPAIR     LEFT  . THYROIDECTOMY  1980   3/4  .  TONSILLECTOMY    . TUBAL LIGATION    . WEIL OSTEOTOMY Right 08/07/2016   Procedure: WEIL OSTEOTOMY 2nd and 3rd Metatarsal Right Foot;  Surgeon: Newt Minion, MD;  Location: [facility];  Service: Orthopedics;  Laterality: Right;  . WEIL OSTEOTOMY Left 12/25/2016   Procedure: 2nd Toe Proximal Interphalangeal Resection, Weil Osteotomy 2nd and 3rd Metatarsal Left Foot;  Surgeon: Newt Minion, MD;  Location: [facility];  Service: Orthopedics;  Laterality: Left;    Current Outpatient Medications  Medication Sig Dispense Refill   . amLODipine (NORVASC) 10 MG tablet Take 10 mg by mouth every morning.     Marland Kitchen aspirin EC 81 MG tablet Take 81 mg by mouth every evening.    . docusate sodium (COLACE) 100 MG capsule Take 100 mg by mouth at bedtime.    Marland Kitchen FLUoxetine (PROZAC) 20 MG capsule Take 20 mg by mouth every morning.     . furosemide (LASIX) 40 MG tablet Take 40 mg by mouth daily.    Marland Kitchen ibuprofen (ADVIL,MOTRIN) 200 MG tablet Take 400 mg by mouth at bedtime.     Javier Docker Oil 350 MG CAPS Take 350 mg by mouth daily.     Marland Kitchen levothyroxine (SYNTHROID, LEVOTHROID) 100 MCG tablet Take 100 mcg by mouth daily before breakfast.     . loratadine (CLARITIN) 10 MG tablet Take 10 mg by mouth daily as needed for allergies.     Marland Kitchen losartan (COZAAR) 100 MG tablet Take 1 tablet (100 mg total) by mouth every evening.    . meclizine (ANTIVERT) 25 MG tablet Take 25 mg by mouth 3 (three) times daily as needed for dizziness.     . metFORMIN (GLUCOPHAGE-XR) 500 MG 24 hr tablet Take 500 mg by mouth daily.    . metoprolol succinate (TOPROL-XL) 100 MG 24 hr tablet Take 100 mg by mouth every morning. Take with or immediately following a meal.    . omeprazole (PRILOSEC) 20 MG capsule Take 20 mg by mouth daily before breakfast.    . simvastatin (ZOCOR) 40 MG tablet Take 40 mg by mouth at bedtime.    . triamcinolone cream (KENALOG) 0.5 % Apply 1 application topically 3 (three) times daily as needed. For rash/irritation.    . Vitamin D, Cholecalciferol, 1000 UNITS CAPS Take 1,000 mg by mouth 2 (two) times daily.     No current facility-administered medications for this visit.    Allergies:  Ephedrine; Hibiclens [chlorhexidine gluconate]; and Keflex [cephalexin]   Social History: The patient  reports that she has never smoked. She has never used smokeless tobacco. She reports that she drinks alcohol. She reports that she does not use drugs.   Family History: The patient's family history includes Arthritis in her father; Asthma in her paternal grandfather;  Diabetes in her father; Emphysema in her paternal grandfather; Heart failure in her father; Hypertension in her father and mother.   ROS:  Please see the history of present illness. Otherwise, complete review of systems is positive for none.  All other systems are reviewed and negative.   Physical Exam: VS:  BP 135/79   Pulse 69   Ht 5\' 5"  (1.651 m)   Wt 262 lb 3.2 oz (118.9 kg)   SpO2 96%   BMI 43.63 kg/m , BMI Body mass index is 43.63 kg/m.  Wt Readings from Last 3 Encounters:  06/10/18 262 lb 3.2 oz (118.9 kg)  04/15/18 263 lb 9.6 oz (119.6 kg)  02/21/17 267 lb (121.1 kg)  General: Patient appears comfortable at rest. HEENT: Conjunctiva and lids normal, oropharynx clear. Neck: Supple, no elevated JVP or carotid bruits, no thyromegaly. Lungs: Clear to auscultation, nonlabored breathing at rest. Cardiac: Regular rate and rhythm, no S3 or significant systolic murmur. Abdomen: Soft, nontender, bowel sounds present, no guarding or rebound. Extremities: No pitting edema, distal pulses 2+. Skin: Warm and dry. Musculoskeletal: No kyphosis. Neuropsychiatric: Alert and oriented x3, affect grossly appropriate.  ECG: I personally reviewed the tracing from 04/15/2018 which showed sinus rhythm.  Recent Labwork:  12/20/2016: BUN 20; Creatinine, Ser 1.12; Hemoglobin 14.4; Platelets 243; Potassium 4.3; Sodium 140  Other Studies Reviewed Today:  Event monitor 04/27/2018: Available strips from 7-day event recorder reviewed.  Sinus rhythm is noted throughout.  Intermittent IVCD noted, patient has history of intermittent left bundle branch block.  Heart rate ranged from 52 bpm up to 90 bpm with average heart rate 66 bpm.  There were very rare PVCs overall.  There was an 8 beat run of NSVT noted on June 30 at 10:22 PM and also July 1 at 10:52 AM.  There was a 4 beat run of NSVT noted on July 5 at 5:11 PM.  There were no sustained arrhythmias or pauses.  Echocardiogram 05/20/2018: Study  Conclusions  - Left ventricle: The cavity size was normal. Wall thickness was   increased in a pattern of mild LVH. Systolic function was normal.   The estimated ejection fraction was in the range of 55% to 60%.   Wall motion was normal; there were no regional wall motion   abnormalities. Doppler parameters are consistent with abnormal   left ventricular relaxation (grade 1 diastolic dysfunction). - Aortic valve: Mildly calcified annulus. Trileaflet; mildly   calcified leaflets. - Mitral valve: Mildly calcified annulus. - Left atrium: The atrium was at the upper limits of normal in   size. - Right atrium: Central venous pressure (est): 3 mm Hg. - Atrial septum: No defect or patent foramen ovale was identified. - Tricuspid valve: There was trivial regurgitation. - Pulmonary arteries: PA peak pressure: 12 mm Hg (S). - Pericardium, extracardiac: There was no pericardial effusion.  Lexiscan Myoview 05/06/2018:  There was no ST segment deviation noted during stress.  Findings consistent with prior myocardial anterior/anteroseptal infarction with mild peri-infarct ischemia.  This is a low risk study.  The left ventricular ejection fraction is normal (55-65%).  Assessment and Plan:  1.  Intermittent palpitations, documented PVCs and a few episodes of NSVT by recent cardiac monitor.  She has had no syncope.  LVEF is normal by echocardiogram.  She remains on high-dose Toprol-XL.  2.  Dyspnea on exertion, abnormal Myoview showing anterior/anteroseptal infarct with peri-infarct ischemia, although soft tissue attenuation is also possible.  She did not have a focal wall motion normality by echocardiogram.  In light of her symptoms and also NSVT on monitor, we discussed proceeding with a diagnostic cardiac catheterization.  After reviewing the risks and benefits she is in agreement to proceed.  3.  Essential hypertension, pressure is reasonably controlled today.  No changes made today.  4.   Intermittent left bundle branch block noted over the years, previous ischemic work-up was negative.  5.  Type 2 diabetes mellitus, currently on Glucophage XR follow-up per Dr. Nadara Mustard.  Current medicines were reviewed with the patient today.   Orders Placed This Encounter  Procedures  . DG Chest 2 View  . Basic metabolic panel  . CBC    Disposition: Follow-up after procedure.  Signed,  Satira Sark, MD, [facility] 06/10/2018 2:28 PM    [facility] [place], [place], [place] [address] Phone: [phone]; Fax: [phone]

## 2018-06-10 ENCOUNTER — Encounter: Payer: Self-pay | Admitting: Cardiology

## 2018-06-10 ENCOUNTER — Other Ambulatory Visit: Payer: Self-pay | Admitting: Cardiology

## 2018-06-10 ENCOUNTER — Ambulatory Visit (INDEPENDENT_AMBULATORY_CARE_PROVIDER_SITE_OTHER): Payer: Medicare Other | Admitting: Cardiology

## 2018-06-10 ENCOUNTER — Telehealth: Payer: Self-pay | Admitting: Cardiology

## 2018-06-10 VITALS — BP 135/79 | HR 69 | Ht 65.0 in | Wt 262.2 lb

## 2018-06-10 DIAGNOSIS — I447 Left bundle-branch block, unspecified: Secondary | ICD-10-CM

## 2018-06-10 DIAGNOSIS — R9439 Abnormal result of other cardiovascular function study: Secondary | ICD-10-CM

## 2018-06-10 DIAGNOSIS — I472 Ventricular tachycardia: Secondary | ICD-10-CM

## 2018-06-10 DIAGNOSIS — I4729 Other ventricular tachycardia: Secondary | ICD-10-CM

## 2018-06-10 DIAGNOSIS — R0609 Other forms of dyspnea: Secondary | ICD-10-CM

## 2018-06-10 DIAGNOSIS — I10 Essential (primary) hypertension: Secondary | ICD-10-CM | POA: Diagnosis not present

## 2018-06-10 NOTE — Patient Instructions (Signed)
Medication Instructions:  Your physician recommends that you continue on your current medications as directed. Please refer to the Current Medication list given to you today.  Testing/Procedures: A chest x-ray takes a picture of the organs and structures inside the chest, including the heart, lungs, and blood vessels. This test can show several things, including, whether the heart is enlarges; whether fluid is building up in the lungs; and whether pacemaker / defibrillator leads are still in place.     [place] [address] Dept: [phone] Loc: [phone]  Grace [place]  06/10/2018  You are scheduled for a Cardiac Catheterization on Thursday, August 29 with Dr. Lauree Chandler.  1. Please arrive at the [facility] (Main Entrance A) at [facility]: [address] [place], [place] [address] at 5:30 AM (This time is two hours before your procedure to ensure your preparation). Free valet parking service is available.   Special note: Every effort is made to have your procedure done on time. Please understand that emergencies sometimes delay scheduled procedures.  2. Diet: Do not eat solid foods after midnight.  The patient may have clear liquids until 5am upon the day of the procedure.  3. Labs: You will need to have blood drawn on Tuesday, August 27 at [facility]. [address], [place]  Open: 7am - 6pm, Sat 8am - 12 noon   Phone: [phone]. You do not need to be fasting.  4. Medication instructions in preparation for your procedure:   Contrast Allergy: No  Stop taking, Lasix (Furosemide)  on Thursday, August 29.  Stop Taking PO Diabetes Meds Glucophage (Metformin)on Thursday, August 29. HOLD medication for 48 hours after procedure  On the morning of your procedure, take your Aspirin and any morning medicines NOT listed above.   You may use sips of water.  5. Plan for one night stay--bring personal belongings. 6. Bring a current list of your medications and current insurance cards. 7. You MUST have a responsible person to drive you home. 8. Someone MUST be with you the first 24 hours after you arrive home or your discharge will be delayed. 9. Please wear clothes that are easy to get on and off and wear slip-on shoes.  Thank you for allowing [place] to care for you!   -- [facility]  Follow-Up: Your physician recommends that you schedule a follow-up appointment in: [place]  Any Other Special Instructions Will Be Listed Below (If Applicable).  If you need a refill on your cardiac medications before your next appointment, please call your pharmacy.

## 2018-06-10 NOTE — Telephone Encounter (Signed)
°  Precert needed for: Chest xray- dyspnea on exertion                                  L heart cath  Location:  [facility]     Date: Jun 18, 2018

## 2018-06-12 ENCOUNTER — Ambulatory Visit (HOSPITAL_COMMUNITY)
Admission: RE | Admit: 2018-06-12 | Discharge: 2018-06-12 | Disposition: A | Discharge status: Home / Self Care | Payer: Medicare Other | Source: Ambulatory Visit | Attending: Cardiology | Admitting: Cardiology

## 2018-06-12 ENCOUNTER — Other Ambulatory Visit (HOSPITAL_COMMUNITY)
Admission: RE | Admit: 2018-06-12 | Discharge: 2018-06-12 | Disposition: A | Discharge status: Home / Self Care | Payer: Medicare Other | Source: Ambulatory Visit | Attending: Cardiology | Admitting: Cardiology

## 2018-06-12 ENCOUNTER — Telehealth: Payer: Self-pay | Admitting: *Deleted

## 2018-06-12 DIAGNOSIS — M4186 Other forms of scoliosis, lumbar region: Secondary | ICD-10-CM | POA: Diagnosis not present

## 2018-06-12 DIAGNOSIS — Z01818 Encounter for other preprocedural examination: Secondary | ICD-10-CM | POA: Diagnosis not present

## 2018-06-12 DIAGNOSIS — R0609 Other forms of dyspnea: Secondary | ICD-10-CM | POA: Diagnosis not present

## 2018-06-12 DIAGNOSIS — I700 Atherosclerosis of aorta: Secondary | ICD-10-CM | POA: Diagnosis not present

## 2018-06-12 LAB — BASIC METABOLIC PANEL
Anion gap: 8 (ref 5–15)
BUN: 17 mg/dL (ref 8–23)
CHLORIDE: 105 mmol/L (ref 98–111)
CO2: 29 mmol/L (ref 22–32)
CREATININE: 1.17 mg/dL — AB (ref 0.44–1.00)
Calcium: 9.7 mg/dL (ref 8.9–10.3)
GFR calc Af Amer: 53 mL/min — ABNORMAL LOW (ref >60)
GFR calc non Af Amer: 46 mL/min — ABNORMAL LOW (ref >60)
Glucose, Bld: 157 mg/dL — ABNORMAL HIGH (ref 70–99)
POTASSIUM: 3.9 mmol/L (ref 3.5–5.1)
SODIUM: 142 mmol/L (ref 135–145)

## 2018-06-12 LAB — CBC
HCT: 42.6 % (ref 36.0–46.0)
HEMOGLOBIN: 14.4 g/dL (ref 12.0–15.0)
MCH: 30.4 pg (ref 26.0–34.0)
MCHC: 33.8 g/dL (ref 30.0–36.0)
MCV: 90.1 fL (ref 78.0–100.0)
PLATELETS: 223 10*3/uL (ref 150–400)
RBC: 4.73 MIL/uL (ref 3.87–5.11)
RDW: 13 % (ref 11.5–15.5)
WBC: 7.2 10*3/uL (ref 4.0–10.5)

## 2018-06-12 NOTE — Telephone Encounter (Signed)
-----   Message from Satira Sark, MD sent at 06/12/2018  2:53 PM EDT ----- Results reviewed.  Creatinine 1.17, GFR is 46.  She will need to hold Cozaar for cardiac catheterization as per protocol. A copy of this test should be forwarded to Rory Percy, MD.

## 2018-06-12 NOTE — Telephone Encounter (Signed)
Patient informed and advised to hold losartan and furosemide the day before and day of heart cath. Verbalized understanding. Copy sent to PCP.

## 2018-06-15 DIAGNOSIS — L570 Actinic keratosis: Secondary | ICD-10-CM | POA: Diagnosis not present

## 2018-06-15 DIAGNOSIS — L309 Dermatitis, unspecified: Secondary | ICD-10-CM | POA: Diagnosis not present

## 2018-06-15 DIAGNOSIS — L821 Other seborrheic keratosis: Secondary | ICD-10-CM | POA: Diagnosis not present

## 2018-06-16 ENCOUNTER — Telehealth: Payer: Self-pay | Admitting: *Deleted

## 2018-06-16 NOTE — Telephone Encounter (Signed)
Pt contacted pre-catheterization scheduled at [facility] for: Thursday June 18, 2018 7:30 AM Verified arrival time and place: [facility] Entrance A at: 5:30 AM  No solid food after midnight prior to cath, clear liquids until 5 AM day of procedure.  Hold: Metformin-day of procedure and 48 hours post procedure. Furosemide-day before and day of procedure. Losartan-day before and day of procedure. NSAIDS-day before and day of procedure.  Except hold medications AM meds can be  taken pre-cath with sip of water including: ASA 81 mg  Confirmed patient has responsible person to drive home post procedure and for 24 hours after you arrive home: yes

## 2018-06-17 NOTE — Telephone Encounter (Signed)
I am not sure why the chest x-ray result did not transfer to MyChart.  I have copied the results here:  FINDINGS: There is no edema or consolidation. Heart is upper normal in size with pulmonary vascularity normal. No adenopathy. There is aortic atherosclerosis. There is upper thoracic levoscoliosis with lower thoracic dextroscoliosis.  IMPRESSION: No edema or consolidation. Stable cardiac silhouette. There is aortic atherosclerosis. Stable scoliosis.  Aortic Atherosclerosis (ICD10-I70.0).

## 2018-06-18 ENCOUNTER — Encounter (HOSPITAL_COMMUNITY)
Admission: RE | Disposition: A | Discharge status: Home / Self Care | Payer: Self-pay | Source: Ambulatory Visit | Attending: Cardiovascular Disease

## 2018-06-18 ENCOUNTER — Encounter (HOSPITAL_COMMUNITY): Payer: Self-pay | Admitting: Cardiovascular Disease

## 2018-06-18 ENCOUNTER — Other Ambulatory Visit: Payer: Self-pay

## 2018-06-18 ENCOUNTER — Ambulatory Visit (HOSPITAL_COMMUNITY)
Admission: RE | Admit: 2018-06-18 | Discharge: 2018-06-18 | Disposition: A | Discharge status: Home / Self Care | Payer: Medicare Other | Source: Ambulatory Visit | Attending: Cardiovascular Disease | Admitting: Cardiovascular Disease

## 2018-06-18 DIAGNOSIS — K219 Gastro-esophageal reflux disease without esophagitis: Secondary | ICD-10-CM | POA: Insufficient documentation

## 2018-06-18 DIAGNOSIS — I252 Old myocardial infarction: Secondary | ICD-10-CM | POA: Insufficient documentation

## 2018-06-18 DIAGNOSIS — F329 Major depressive disorder, single episode, unspecified: Secondary | ICD-10-CM | POA: Diagnosis not present

## 2018-06-18 DIAGNOSIS — I447 Left bundle-branch block, unspecified: Secondary | ICD-10-CM | POA: Insufficient documentation

## 2018-06-18 DIAGNOSIS — Z79890 Hormone replacement therapy (postmenopausal): Secondary | ICD-10-CM | POA: Insufficient documentation

## 2018-06-18 DIAGNOSIS — M419 Scoliosis, unspecified: Secondary | ICD-10-CM | POA: Diagnosis not present

## 2018-06-18 DIAGNOSIS — E039 Hypothyroidism, unspecified: Secondary | ICD-10-CM | POA: Insufficient documentation

## 2018-06-18 DIAGNOSIS — Z7984 Long term (current) use of oral hypoglycemic drugs: Secondary | ICD-10-CM | POA: Diagnosis not present

## 2018-06-18 DIAGNOSIS — F419 Anxiety disorder, unspecified: Secondary | ICD-10-CM | POA: Diagnosis not present

## 2018-06-18 DIAGNOSIS — I10 Essential (primary) hypertension: Secondary | ICD-10-CM | POA: Diagnosis not present

## 2018-06-18 DIAGNOSIS — Z833 Family history of diabetes mellitus: Secondary | ICD-10-CM | POA: Insufficient documentation

## 2018-06-18 DIAGNOSIS — Z9842 Cataract extraction status, left eye: Secondary | ICD-10-CM | POA: Diagnosis not present

## 2018-06-18 DIAGNOSIS — I472 Ventricular tachycardia: Secondary | ICD-10-CM | POA: Diagnosis not present

## 2018-06-18 DIAGNOSIS — G2581 Restless legs syndrome: Secondary | ICD-10-CM | POA: Diagnosis not present

## 2018-06-18 DIAGNOSIS — Z9889 Other specified postprocedural states: Secondary | ICD-10-CM | POA: Insufficient documentation

## 2018-06-18 DIAGNOSIS — E119 Type 2 diabetes mellitus without complications: Secondary | ICD-10-CM | POA: Insufficient documentation

## 2018-06-18 DIAGNOSIS — Z9851 Tubal ligation status: Secondary | ICD-10-CM | POA: Insufficient documentation

## 2018-06-18 DIAGNOSIS — I2510 Atherosclerotic heart disease of native coronary artery without angina pectoris: Secondary | ICD-10-CM | POA: Diagnosis not present

## 2018-06-18 DIAGNOSIS — Z881 Allergy status to other antibiotic agents status: Secondary | ICD-10-CM | POA: Insufficient documentation

## 2018-06-18 DIAGNOSIS — Z9841 Cataract extraction status, right eye: Secondary | ICD-10-CM | POA: Diagnosis not present

## 2018-06-18 DIAGNOSIS — Z981 Arthrodesis status: Secondary | ICD-10-CM | POA: Diagnosis not present

## 2018-06-18 DIAGNOSIS — Z8261 Family history of arthritis: Secondary | ICD-10-CM | POA: Insufficient documentation

## 2018-06-18 DIAGNOSIS — Z79899 Other long term (current) drug therapy: Secondary | ICD-10-CM | POA: Diagnosis not present

## 2018-06-18 DIAGNOSIS — Z7982 Long term (current) use of aspirin: Secondary | ICD-10-CM | POA: Diagnosis not present

## 2018-06-18 DIAGNOSIS — Z8249 Family history of ischemic heart disease and other diseases of the circulatory system: Secondary | ICD-10-CM | POA: Diagnosis not present

## 2018-06-18 DIAGNOSIS — Z96653 Presence of artificial knee joint, bilateral: Secondary | ICD-10-CM | POA: Insufficient documentation

## 2018-06-18 DIAGNOSIS — M1990 Unspecified osteoarthritis, unspecified site: Secondary | ICD-10-CM | POA: Insufficient documentation

## 2018-06-18 DIAGNOSIS — Z9049 Acquired absence of other specified parts of digestive tract: Secondary | ICD-10-CM | POA: Diagnosis not present

## 2018-06-18 DIAGNOSIS — R9439 Abnormal result of other cardiovascular function study: Secondary | ICD-10-CM | POA: Diagnosis not present

## 2018-06-18 HISTORY — PX: LEFT HEART CATH AND CORONARY ANGIOGRAPHY: CATH118249

## 2018-06-18 LAB — GLUCOSE, CAPILLARY: GLUCOSE-CAPILLARY: 141 mg/dL — AB (ref 70–99)

## 2018-06-18 SURGERY — LEFT HEART CATH AND CORONARY ANGIOGRAPHY
Anesthesia: LOCAL

## 2018-06-18 MED ORDER — SODIUM CHLORIDE 0.9 % IV SOLN: 250.0000 mL | INTRAVENOUS | Status: DC | PRN

## 2018-06-18 MED ORDER — ONDANSETRON HCL 4 MG/2ML IJ SOLN: 4.0000 mg | Freq: Four times a day (QID) | INTRAMUSCULAR | Status: DC | PRN

## 2018-06-18 MED ORDER — IOPAMIDOL (ISOVUE-370) INJECTION 76%
INTRAVENOUS | Status: AC
  Filled 2018-06-18: qty 100

## 2018-06-18 MED ORDER — IOPAMIDOL (ISOVUE-370) INJECTION 76%
INTRAVENOUS | Status: DC | PRN
  Administered 2018-06-18: 55 mL via INTRA_ARTERIAL

## 2018-06-18 MED ORDER — SODIUM CHLORIDE 0.9 % IV SOLN: INTRAVENOUS | Status: AC

## 2018-06-18 MED ORDER — SODIUM CHLORIDE 0.9% FLUSH: 3.0000 mL | INTRAVENOUS | Status: DC | PRN

## 2018-06-18 MED ORDER — VERAPAMIL HCL 2.5 MG/ML IV SOLN
INTRAVENOUS | Status: DC | PRN
  Administered 2018-06-18: 8 mL via INTRA_ARTERIAL

## 2018-06-18 MED ORDER — SODIUM CHLORIDE 0.9% FLUSH: 3.0000 mL | Freq: Two times a day (BID) | INTRAVENOUS | Status: DC

## 2018-06-18 MED ORDER — MIDAZOLAM HCL 2 MG/2ML IJ SOLN
INTRAMUSCULAR | Status: AC
  Filled 2018-06-18: qty 2

## 2018-06-18 MED ORDER — LIDOCAINE HCL (PF) 1 % IJ SOLN
INTRAMUSCULAR | Status: DC | PRN
  Administered 2018-06-18: 2 mL

## 2018-06-18 MED ORDER — MIDAZOLAM HCL 2 MG/2ML IJ SOLN
INTRAMUSCULAR | Status: DC | PRN
  Administered 2018-06-18: 1 mg via INTRAVENOUS

## 2018-06-18 MED ORDER — HEPARIN (PORCINE) IN NACL 1000-0.9 UT/500ML-% IV SOLN
INTRAVENOUS | Status: DC | PRN
  Administered 2018-06-18: 500 mL

## 2018-06-18 MED ORDER — FENTANYL CITRATE (PF) 100 MCG/2ML IJ SOLN
INTRAMUSCULAR | Status: DC | PRN
  Administered 2018-06-18: 50 ug via INTRAVENOUS

## 2018-06-18 MED ORDER — HEPARIN SODIUM (PORCINE) 1000 UNIT/ML IJ SOLN
INTRAMUSCULAR | Status: DC | PRN
  Administered 2018-06-18: 6000 [IU] via INTRAVENOUS

## 2018-06-18 MED ORDER — LIDOCAINE HCL (PF) 1 % IJ SOLN
INTRAMUSCULAR | Status: AC
  Filled 2018-06-18: qty 30

## 2018-06-18 MED ORDER — HEPARIN (PORCINE) IN NACL 1000-0.9 UT/500ML-% IV SOLN
INTRAVENOUS | Status: AC
  Filled 2018-06-18: qty 1000

## 2018-06-18 MED ORDER — SODIUM CHLORIDE 0.9 % WEIGHT BASED INFUSION
3.0000 mL/kg/h | INTRAVENOUS | Status: AC
  Administered 2018-06-18: 3 mL/kg/h via INTRAVENOUS

## 2018-06-18 MED ORDER — ACETAMINOPHEN 325 MG PO TABS: 650.0000 mg | ORAL_TABLET | ORAL | Status: DC | PRN

## 2018-06-18 MED ORDER — HEPARIN SODIUM (PORCINE) 1000 UNIT/ML IJ SOLN
INTRAMUSCULAR | Status: AC
  Filled 2018-06-18: qty 1

## 2018-06-18 MED ORDER — ASPIRIN 81 MG PO CHEW: 81.0000 mg | CHEWABLE_TABLET | ORAL | Status: AC

## 2018-06-18 MED ORDER — FENTANYL CITRATE (PF) 100 MCG/2ML IJ SOLN
INTRAMUSCULAR | Status: AC
  Filled 2018-06-18: qty 2

## 2018-06-18 MED ORDER — SODIUM CHLORIDE 0.9 % WEIGHT BASED INFUSION: 1.0000 mL/kg/h | INTRAVENOUS | Status: DC

## 2018-06-18 MED ORDER — VERAPAMIL HCL 2.5 MG/ML IV SOLN
INTRAVENOUS | Status: AC
  Filled 2018-06-18: qty 2

## 2018-06-18 SURGICAL SUPPLY — 11 items
CATH INFINITI 5 FR JL3.5 (CATHETERS) ×1 IMPLANT
CATH INFINITI JR4 5F (CATHETERS) ×1 IMPLANT
DEVICE RAD COMP TR BAND LRG (VASCULAR PRODUCTS) ×1 IMPLANT
GLIDESHEATH SLEND SS 6F .021 (SHEATH) ×1 IMPLANT
GUIDEWIRE INQWIRE 1.5J.035X260 (WIRE) IMPLANT
INQWIRE 1.5J .035X260CM (WIRE) ×2
KIT HEART LEFT (KITS) ×2 IMPLANT
PACK CARDIAC CATHETERIZATION (CUSTOM PROCEDURE TRAY) ×2 IMPLANT
TRANSDUCER W/STOPCOCK (MISCELLANEOUS) ×2 IMPLANT
TUBING CIL FLEX 10 FLL-RA (TUBING) ×2 IMPLANT
WIRE HI TORQ VERSACORE-J 145CM (WIRE) ×1 IMPLANT

## 2018-06-18 NOTE — Discharge Instructions (Signed)
**Note -identified via Obfuscation** Radial Site Care °Refer to this sheet in the next few weeks. These instructions provide you with information about caring for yourself after your procedure. Your health care provider may also give you more specific instructions. Your treatment has been planned according to current medical practices, but problems sometimes occur. Call your health care provider if you have any problems or questions after your procedure. °What can I expect after the procedure? °After your procedure, it is typical to have the following: °· Bruising at the radial site that usually fades within 1-2 weeks. °· Blood collecting in the tissue (hematoma) that may be painful to the touch. It should usually decrease in size and tenderness within 1-2 weeks. ° °Follow these instructions at home: °· Take medicines only as directed by your health care provider. °· You may shower 24-48 hours after the procedure or as directed by your health care provider. Remove the bandage (dressing) and gently wash the site with plain soap and water. Pat the area dry with a clean towel. Do not rub the site, because this may cause bleeding. °· Do not take baths, swim, or use a hot tub until your health care provider approves. °· Check your insertion site every day for redness, swelling, or drainage. °· Do not apply powder or lotion to the site. °· Do not flex or bend the affected arm for 24 hours or as directed by your health care provider. °· Do not push or pull heavy objects with the affected arm for 24 hours or as directed by your health care provider. °· Do not lift over 10 lb (4.5 kg) for 5 days after your procedure or as directed by your health care provider. °· Ask your health care provider when it is okay to: °? Return to work or school. °? Resume usual physical activities or sports. °? Resume sexual activity. °· Do not drive home if you are discharged the same day as the procedure. Have someone else drive you. °· You may drive 24 hours after the procedure  unless otherwise instructed by your health care provider. °· Do not operate machinery or power tools for 24 hours after the procedure. °· If your procedure was done as an outpatient procedure, which means that you went home the same day as your procedure, a responsible adult should be with you for the first 24 hours after you arrive home. °· Keep all follow-up visits as directed by your health care provider. This is important. °Contact a health care provider if: °· You have a fever. °· You have chills. °· You have increased bleeding from the radial site. Hold pressure on the site. °Get help right away if: °· You have unusual pain at the radial site. °· You have redness, warmth, or swelling at the radial site. °· You have drainage (other than a small amount of blood on the dressing) from the radial site. °· The radial site is bleeding, and the bleeding does not stop after 30 minutes of holding steady pressure on the site. °· Your arm or hand becomes pale, cool, tingly, or numb. °This information is not intended to replace advice given to you by your health care provider. Make sure you discuss any questions you have with your health care provider. °Document Released: 11/09/2010 Document Revised: 03/14/2016 Document Reviewed: 04/25/2014 °Elsevier Interactive Patient Education © 2018 [employer] ° °

## 2018-06-18 NOTE — Interval H&P Note (Signed)
History and Physical Interval Note:  06/18/2018 7:20 AM  [place]  has presented today for cardiac cath with the diagnosis of NSVT, abnormal stress test. The various methods of treatment have been discussed with the patient and family. After consideration of risks, benefits and other options for treatment, the patient has consented to  Procedure(s): LEFT HEART CATH AND CORONARY ANGIOGRAPHY (N/A) as a surgical intervention .  The patient's history has been reviewed, patient examined, no change in status, stable for surgery.  I have reviewed the patient's chart and labs.  Questions were answered to the patient's satisfaction.    Cath Lab Visit (complete for each Cath Lab visit)  Clinical Evaluation Leading to the Procedure:   ACS: No.  Non-ACS:    Anginal Classification: No Symptoms  Anti-ischemic medical therapy: Minimal Therapy (1 class of medications)  Non-Invasive Test Results: Low-risk stress test findings: cardiac mortality <1%/year  Prior CABG: No previous CABG         Lauree Chandler

## 2018-07-21 DIAGNOSIS — Z23 Encounter for immunization: Secondary | ICD-10-CM | POA: Diagnosis not present

## 2018-07-21 DIAGNOSIS — M25511 Pain in right shoulder: Secondary | ICD-10-CM | POA: Diagnosis not present

## 2018-07-21 DIAGNOSIS — S46111A Strain of muscle, fascia and tendon of long head of biceps, right arm, initial encounter: Secondary | ICD-10-CM | POA: Diagnosis not present

## 2018-08-03 ENCOUNTER — Ambulatory Visit: Payer: Medicare Other | Admitting: Cardiology

## 2018-08-04 ENCOUNTER — Ambulatory Visit: Payer: Medicare Other | Admitting: Cardiology

## 2018-08-07 DIAGNOSIS — I10 Essential (primary) hypertension: Secondary | ICD-10-CM | POA: Diagnosis not present

## 2018-08-07 DIAGNOSIS — E1165 Type 2 diabetes mellitus with hyperglycemia: Secondary | ICD-10-CM | POA: Diagnosis not present

## 2018-08-07 DIAGNOSIS — E039 Hypothyroidism, unspecified: Secondary | ICD-10-CM | POA: Diagnosis not present

## 2018-08-11 DIAGNOSIS — R609 Edema, unspecified: Secondary | ICD-10-CM | POA: Diagnosis not present

## 2018-08-11 DIAGNOSIS — E039 Hypothyroidism, unspecified: Secondary | ICD-10-CM | POA: Diagnosis not present

## 2018-08-11 DIAGNOSIS — R002 Palpitations: Secondary | ICD-10-CM | POA: Diagnosis not present

## 2018-08-11 DIAGNOSIS — Z6841 Body Mass Index (BMI) 40.0 and over, adult: Secondary | ICD-10-CM | POA: Diagnosis not present

## 2018-08-28 NOTE — Progress Notes (Signed)
Cardiology Office Note  Date: 08/31/2018   ID: Grace Delacruz, DOB [birth year], MRN [id number]  PCP: Rory Percy, MD  Primary Cardiologist: Rozann Lesches, MD   Chief Complaint  Patient presents with  . PVCs    History of Present Illness: Grace Delacruz is a 72 y.o. female last seen in August.  He was referred for a diagnostic cardiac catheterization at that time following abnormal Myoview and in the setting of dyspnea on exertion and frequent PVCs.  Procedure was performed by Dr. Angelena Form, fortunately demonstrating only mild nonobstructive coronary atherosclerosis.  She presents today for follow-up, overall reassured by her testing.  She still has intermittent palpitations, but no dizziness or syncope.  She has been working on diet and weight loss, has lost about 10 pounds by her scales.  I reviewed her medications and will plan to continue high-dose Toprol-XL.  Past Medical History:  Diagnosis Date  . Anxiety   . Depression   . DJD (degenerative joint disease)   . Esophageal reflux   . Essential hypertension, benign   . Fibrocystic breast disease   . Hypothyroidism   . Left bundle branch block    Transient - possibly rate related   . Menopause   . Osteoarthritis   . PONV (postoperative nausea and vomiting)    pt. would like to have a scopolamine patch  . Restless leg syndrome   . Scoliosis   . Type 2 diabetes mellitus ([place])     Past Surgical History:  Procedure Laterality Date  . BREAST BIOPSY     lt x3  . CARPOMETACARPEL SUSPENSION PLASTY Left 03/08/2014   Procedure: LEFT THUMB CARPOMETACARPEL ([place]) SUSPENSION PLASTY WITH TRAPEZIUM EXCISION;  Surgeon: [name]., MD;  Location: [facility];  Service: Orthopedics;  Laterality: Left;  . CATARACT EXTRACTION Bilateral   . CESAREAN SECTION    . CHOLECYSTECTOMY    . COLONOSCOPY  03/26/2007   RMR:  Diffuse changes of proctocolitis as described above diminutive rectal polyp, cold  biopsy/removed, pedunculated polyp of the splenic flexure hot snare removed.  Segmental biopsies the colon taken of the ileocecal valve ulcer biopsied normal terminal ileum  . COLONOSCOPY N/A 01/05/2014   Procedure: COLONOSCOPY;  Surgeon: Rogene Houston, MD;  [facility];  Service: Endoscopy;  Laterality: N/A;  830  . EYE SURGERY    . GASTROCNEMIUS RECESSION Right 08/07/2016   Procedure: GASTROCNEMIUS RECESSION;  Surgeon: Newt Minion, MD;  Location: [facility];  Service: Orthopedics;  Laterality: Right;  . Joint fusion-foot     x2 LEFT FOOT  . KNEE ARTHROPLASTY  2001   Right  . KNEE ARTHROPLASTY  2002   Left  . LEFT HEART CATH AND CORONARY ANGIOGRAPHY N/A 06/18/2018   Procedure: LEFT HEART CATH AND CORONARY ANGIOGRAPHY;  Surgeon: Burnell Blanks, MD;  Location: [facility];  Service: Cardiovascular;  Laterality: N/A;  . Left leg vein resection    . ROTATOR CUFF REPAIR     LEFT  . THYROIDECTOMY  1980   3/4  . TONSILLECTOMY    . TUBAL LIGATION    . WEIL OSTEOTOMY Right 08/07/2016   Procedure: WEIL OSTEOTOMY 2nd and 3rd Metatarsal Right Foot;  Surgeon: Newt Minion, MD;  Location: [facility];  Service: Orthopedics;  Laterality: Right;  . WEIL OSTEOTOMY Left 12/25/2016   Procedure: 2nd Toe Proximal Interphalangeal Resection, Weil Osteotomy 2nd and 3rd Metatarsal Left Foot;  Surgeon: Newt Minion, MD;  Location: [facility];  Service: Orthopedics;  Laterality: Left;    Current Outpatient Medications  Medication Sig Dispense Refill  . aspirin EC 81 MG tablet Take 81 mg by mouth at bedtime.     . docusate sodium (COLACE) 100 MG capsule Take 100 mg by mouth at bedtime.    Marland Kitchen FLUoxetine (PROZAC) 20 MG capsule Take 20 mg by mouth every morning.     . furosemide (LASIX) 40 MG tablet Take 40 mg by mouth daily.    Marland Kitchen ibuprofen (ADVIL,MOTRIN) 200 MG tablet Take 400 mg by mouth at bedtime.     Javier Docker Oil 500 MG CAPS Take 500 mg by mouth daily.    Marland Kitchen levothyroxine (SYNTHROID, LEVOTHROID)  100 MCG tablet Take 100 mcg by mouth daily before breakfast.     . loratadine (CLARITIN) 10 MG tablet Take 10 mg by mouth daily.     Marland Kitchen losartan (COZAAR) 100 MG tablet Take 1 tablet (100 mg total) by mouth every evening.    . meclizine (ANTIVERT) 25 MG tablet Take 25 mg by mouth 3 (three) times daily as needed for dizziness.     . metFORMIN (GLUCOPHAGE-XR) 500 MG 24 hr tablet Take 500 mg by mouth daily.    . metoprolol succinate (TOPROL-XL) 100 MG 24 hr tablet Take 100 mg by mouth every morning. Take with or immediately following a meal.    . omeprazole (PRILOSEC) 20 MG capsule Take 20 mg by mouth daily before breakfast.    . simvastatin (ZOCOR) 40 MG tablet Take 40 mg by mouth at bedtime.    . triamcinolone cream (KENALOG) 0.5 % Apply 1 application topically 3 (three) times daily as needed. For rash/irritation.    . Vitamin D, Cholecalciferol, 1000 UNITS CAPS Take 1,000 mg by mouth 2 (two) times daily.     No current facility-administered medications for this visit.    Allergies:  Ephedrine; Hibiclens [chlorhexidine gluconate]; and Keflex [cephalexin]   Social History: The patient  reports that she has never smoked. She has never used smokeless tobacco. She reports that she drinks alcohol. She reports that she does not use drugs.   ROS:  Please see the history of present illness. Otherwise, complete review of systems is positive for leg pain related to varicose veins.  All other systems are reviewed and negative.   Physical Exam: VS:  BP 128/80   Pulse 62   Ht 5\' 5"  (1.651 m)   Wt 257 lb 3.2 oz (116.7 kg)   SpO2 97%   BMI 42.80 kg/m , BMI Body mass index is 42.8 kg/m.  Wt Readings from Last 3 Encounters:  08/31/18 257 lb 3.2 oz (116.7 kg)  06/18/18 260 lb (117.9 kg)  06/10/18 262 lb 3.2 oz (118.9 kg)    General: Patient appears comfortable at rest. HEENT: Conjunctiva and lids normal, oropharynx clear. Neck: Supple, no elevated JVP or carotid bruits, no thyromegaly. Lungs: Clear  to auscultation, nonlabored breathing at rest. Cardiac: Regular rate and rhythm, no S3 or significant systolic murmur. Abdomen: Soft, nontender, bowel sounds present. Extremities: No pitting edema, distal pulses 2+. Skin: Warm and dry. Musculoskeletal: No kyphosis. Neuropsychiatric: Alert and oriented x3, affect grossly appropriate.  ECG: I personally reviewed the tracing from 06/18/2018 which showed sinus rhythm with intermittent left bundle branch block.  Recent Labwork: 06/12/2018: BUN 17; Creatinine, Ser 1.17; Hemoglobin 14.4; Platelets 223; Potassium 3.9; Sodium 142   Other Studies Reviewed Today:  Cardiac catheterization 06/18/2018:  Mid RCA lesion is 20% stenosed.  Prox LAD to Mid LAD lesion is 30% stenosed.  Ost 1st Diag lesion is 20% stenosed.   1. Mild non-obstructive CAD 2. Mild elevation LVEDP  Recommendations: Medical management of CAD.   Echocardiogram 05/20/2018: Study Conclusions  - Left ventricle: The cavity size was normal. Wall thickness was   increased in a pattern of mild LVH. Systolic function was normal.   The estimated ejection fraction was in the range of 55% to 60%.   Wall motion was normal; there were no regional wall motion   abnormalities. Doppler parameters are consistent with abnormal   left ventricular relaxation (grade 1 diastolic dysfunction). - Aortic valve: Mildly calcified annulus. Trileaflet; mildly   calcified leaflets. - Mitral valve: Mildly calcified annulus. - Left atrium: The atrium was at the upper limits of normal in   size. - Right atrium: Central venous pressure (est): 3 mm Hg. - Atrial septum: No defect or patent foramen ovale was identified. - Tricuspid valve: There was trivial regurgitation. - Pulmonary arteries: PA peak pressure: 12 mm Hg (S). - Pericardium, extracardiac: There was no pericardial effusion.  Assessment and Plan:  1.  PVCs and brief episodes of NSVT by cardiac monitoring.  LVEF is normal and she had no  significant obstructive CAD at cardiac catheterization.  Plan to continue high-dose Toprol-XL and observation for now.  2.  Intermittent left bundle branch block by ECG.  3.  Essential hypertension, blood pressure is well controlled today.  We talked about diet and exercise today.  Current medicines were reviewed with the patient today.  Disposition: Follow-up in 6 months.  Signed, Satira Sark, MD, [facility] 08/31/2018 1:54 PM    [facility] [place], [place], [place] [address] Phone: [phone]; Fax: [phone]

## 2018-08-31 ENCOUNTER — Encounter: Payer: Self-pay | Admitting: Cardiology

## 2018-08-31 ENCOUNTER — Ambulatory Visit (INDEPENDENT_AMBULATORY_CARE_PROVIDER_SITE_OTHER): Payer: Medicare Other | Admitting: Cardiology

## 2018-08-31 VITALS — BP 128/80 | HR 62 | Ht 65.0 in | Wt 257.2 lb

## 2018-08-31 DIAGNOSIS — R0609 Other forms of dyspnea: Secondary | ICD-10-CM | POA: Diagnosis not present

## 2018-08-31 DIAGNOSIS — I10 Essential (primary) hypertension: Secondary | ICD-10-CM | POA: Diagnosis not present

## 2018-08-31 DIAGNOSIS — I493 Ventricular premature depolarization: Secondary | ICD-10-CM

## 2018-08-31 DIAGNOSIS — I447 Left bundle-branch block, unspecified: Secondary | ICD-10-CM

## 2018-08-31 NOTE — Patient Instructions (Signed)

## 2018-12-16 DIAGNOSIS — L570 Actinic keratosis: Secondary | ICD-10-CM | POA: Diagnosis not present

## 2018-12-17 DIAGNOSIS — Z1231 Encounter for screening mammogram for malignant neoplasm of breast: Secondary | ICD-10-CM | POA: Diagnosis not present

## 2018-12-19 DIAGNOSIS — E039 Hypothyroidism, unspecified: Secondary | ICD-10-CM | POA: Diagnosis not present

## 2019-02-05 DIAGNOSIS — E039 Hypothyroidism, unspecified: Secondary | ICD-10-CM | POA: Diagnosis not present

## 2019-02-05 DIAGNOSIS — I10 Essential (primary) hypertension: Secondary | ICD-10-CM | POA: Diagnosis not present

## 2019-02-05 DIAGNOSIS — E1165 Type 2 diabetes mellitus with hyperglycemia: Secondary | ICD-10-CM | POA: Diagnosis not present

## 2019-02-05 DIAGNOSIS — E7800 Pure hypercholesterolemia, unspecified: Secondary | ICD-10-CM | POA: Diagnosis not present

## 2019-02-05 DIAGNOSIS — M419 Scoliosis, unspecified: Secondary | ICD-10-CM | POA: Diagnosis not present

## 2019-02-08 DIAGNOSIS — I10 Essential (primary) hypertension: Secondary | ICD-10-CM | POA: Diagnosis not present

## 2019-02-08 DIAGNOSIS — Z0001 Encounter for general adult medical examination with abnormal findings: Secondary | ICD-10-CM | POA: Diagnosis not present

## 2019-02-08 DIAGNOSIS — E119 Type 2 diabetes mellitus without complications: Secondary | ICD-10-CM | POA: Diagnosis not present

## 2019-02-08 DIAGNOSIS — R609 Edema, unspecified: Secondary | ICD-10-CM | POA: Diagnosis not present

## 2019-02-08 DIAGNOSIS — E785 Hyperlipidemia, unspecified: Secondary | ICD-10-CM | POA: Diagnosis not present

## 2019-02-08 DIAGNOSIS — E1165 Type 2 diabetes mellitus with hyperglycemia: Secondary | ICD-10-CM | POA: Diagnosis not present

## 2019-02-08 DIAGNOSIS — Z6841 Body Mass Index (BMI) 40.0 and over, adult: Secondary | ICD-10-CM | POA: Diagnosis not present

## 2019-02-08 DIAGNOSIS — E039 Hypothyroidism, unspecified: Secondary | ICD-10-CM | POA: Diagnosis not present

## 2019-03-01 ENCOUNTER — Telehealth: Payer: Self-pay | Admitting: Cardiology

## 2019-03-01 NOTE — Telephone Encounter (Signed)
Virtual Visit Pre-Appointment Phone Call  "(Name), I am calling you today to discuss your upcoming appointment. We are currently trying to limit exposure to the virus that causes COVID-19 by seeing patients at home rather than in the office."  1. "What is the BEST phone number to call the day of the visit?" - include this in appointment notes  2. Do you have or have access to (through a family member/friend) a smartphone with video capability that we can use for your visit?" a. If yes - list this number in appt notes as cell (if different from BEST phone #) and list the appointment type as a VIDEO visit in appointment notes b. If no - list the appointment type as a PHONE visit in appointment notes  3. Confirm consent - "In the setting of the current Covid19 crisis, you are scheduled for a (phone or video) visit with your provider on (date) at (time).  Just as we do with many in-office visits, in order for you to participate in this visit, we must obtain consent.  If you'd like, I can send this to your mychart (if signed up) or email for you to review.  Otherwise, I can obtain your verbal consent now.  All virtual visits are billed to your insurance company just like a normal visit would be.  By agreeing to a virtual visit, we'd like you to understand that the technology does not allow for your provider to perform an examination, and thus may limit your provider's ability to fully assess your condition. If your provider identifies any concerns that need to be evaluated in person, we will make arrangements to do so.  Finally, though the technology is pretty good, we cannot assure that it will always work on either your or our end, and in the setting of a video visit, we may have to convert it to a phone-only visit.  In either situation, we cannot ensure that we have a secure connection.  Are you willing to proceed?" STAFF: Did the patient verbally acknowledge consent to telehealth visit? Document  YES/NO here: yes  4. Advise patient to be prepared - "Two hours prior to your appointment, go ahead and check your blood pressure, pulse, oxygen saturation, and your weight (if you have the equipment to check those) and write them all down. When your visit starts, your provider will ask you for this information. If you have an Apple Watch or Kardia device, please plan to have heart rate information ready on the day of your appointment. Please have a pen and paper handy nearby the day of the visit as well."  5. Give patient instructions for MyChart download to smartphone OR [email or web address] as below if video visit (depending on what platform provider is using)  6. Inform patient they will receive a phone call 15 minutes prior to their appointment time (may be from unknown caller ID) so they should be prepared to answer    TELEPHONE CALL NOTE  FALICITY SHEETS has been deemed a candidate for a follow-up tele-health visit to limit community exposure during the Covid-19 pandemic. I spoke with the patient via phone to ensure availability of phone/video source, confirm preferred email & phone number, and discuss instructions and expectations.  I reminded Grace Delacruz to be prepared with any vital sign and/or heart rhythm information that could potentially be obtained via home monitoring, at the time of her visit. I reminded Grace Delacruz to expect a phone call prior to  her visit.  Grace Delacruz 03/01/2019 11:47 AM   INSTRUCTIONS FOR DOWNLOADING THE MYCHART APP TO SMARTPHONE  - The patient must first make sure to have activated MyChart and know their login information - If Apple, go to [employer] and type in MyChart in the search bar and download the app. If Android, ask patient to go to [employer] and type in [place] in the search bar and download the app. The app is free but as with any other app downloads, their phone may require them to verify saved payment information or  Apple/Android password.  - The patient will need to then log into the app with their MyChart username and password, and select [facility] as their healthcare provider to link the account. When it is time for your visit, go to the MyChart app, find appointments, and click Begin Video Visit. Be sure to Select Allow for your device to access the Microphone and Camera for your visit. You will then be connected, and your provider will be with you shortly.  **If they have any issues connecting, or need assistance please contact MyChart service desk [phone])**  **If using a computer, in order to ensure the best quality for their visit they will need to use either of the following Internet Browsers: [employer], or Google Chrome**  IF USING DOXIMITY or DOXY.ME - The patient will receive a link just prior to their visit by text.     FULL LENGTH CONSENT FOR TELE-HEALTH VISIT   I hereby voluntarily request, consent and authorize [facility] and its employed or contracted physicians, physician assistants, nurse practitioners or other licensed health care professionals (the Practitioner), to provide me with telemedicine health care services (the Services") as deemed necessary by the treating Practitioner. I acknowledge and consent to receive the Services by the Practitioner via telemedicine. I understand that the telemedicine visit will involve communicating with the Practitioner through live audiovisual communication technology and the disclosure of certain medical information by electronic transmission. I acknowledge that I have been given the opportunity to request an in-person assessment or other available alternative prior to the telemedicine visit and am voluntarily participating in the telemedicine visit.  I understand that I have the right to withhold or withdraw my consent to the use of telemedicine in the course of my care at any time, without affecting my right to future care  or treatment, and that the Practitioner or I may terminate the telemedicine visit at any time. I understand that I have the right to inspect all information obtained and/or recorded in the course of the telemedicine visit and may receive copies of available information for a reasonable fee.  I understand that some of the potential risks of receiving the Services via telemedicine include:   Delay or interruption in medical evaluation due to technological equipment failure or disruption;  Information transmitted may not be sufficient (e.g. poor resolution of images) to allow for appropriate medical decision making by the Practitioner; and/or   In rare instances, security protocols could fail, causing a breach of personal health information.  Furthermore, I acknowledge that it is my responsibility to provide information about my medical history, conditions and care that is complete and accurate to the best of my ability. I acknowledge that Practitioner's advice, recommendations, and/or decision may be based on factors not within their control, such as incomplete or inaccurate data provided by me or distortions of diagnostic images or specimens that may result from electronic transmissions. I  understand that the practice of medicine is not an exact science and that Practitioner makes no warranties or guarantees regarding treatment outcomes. I acknowledge that I will receive a copy of this consent concurrently upon execution via email to the email address I last provided but may also request a printed copy by calling the office of [facility].    I understand that my insurance will be billed for this visit.   I have read or had this consent read to me.  I understand the contents of this consent, which adequately explains the benefits and risks of the Services being provided via telemedicine.   I have been provided ample opportunity to ask questions regarding this consent and the Services and have had  my questions answered to my satisfaction.  I give my informed consent for the services to be provided through the use of telemedicine in my medical care  By participating in this telemedicine visit I agree to the above.

## 2019-03-02 NOTE — Progress Notes (Signed)
Virtual Visit via Video Note   This visit type was conducted due to national recommendations for restrictions regarding the COVID-19 Pandemic (e.g. social distancing) in an effort to limit this patient's exposure and mitigate transmission in our community.  Due to her co-morbid illnesses, this patient is at least at moderate risk for complications without adequate follow up.  This format is felt to be most appropriate for this patient at this time.  All issues noted in this document were discussed and addressed.  A limited physical exam was performed with this format.  Please refer to the patient's chart for her consent to telehealth for [facility].   Date:  03/03/2019   ID:  Grace Delacruz, DOB December 08, 1945, MRN [id number]  Patient Location: Home Provider Location: Office  PCP:  Rory Percy, MD  Cardiologist: Satira Sark, MD  Evaluation Performed:  Follow-Up Visit  Chief Complaint:   Cardiac follow-up  History of Present Illness:    Grace Delacruz is a 73 y.o. female last seen in November 2019.  We communicated via video conferencing today.  She is doing well from a cardiac perspective.  No active angina symptoms or increasing palpitations, no dizziness or syncope.  She states that she has been social distancing, staying at home mostly.  She has taken up quilting.  She had lab work with Dr. Nadara Mustard in April, we are requesting the results. Diabetic regimen was uptitrated.  She is still working on diet and weight loss.  I reviewed her remaining medications which are otherwise stable from a cardiac perspective.  The patient does not have symptoms concerning for COVID-19 infection (fever, chills, cough, or new shortness of breath).    Past Medical History:  Diagnosis Date  . Anxiety   . Depression   . DJD (degenerative joint disease)   . Esophageal reflux   . Essential hypertension, benign   . Fibrocystic breast disease   . Hypothyroidism   . Left bundle branch block     Transient - possibly rate related   . Menopause   . Osteoarthritis   . PONV (postoperative nausea and vomiting)    pt. would like to have a scopolamine patch  . Restless leg syndrome   . Scoliosis   . Type 2 diabetes mellitus ([place])    Past Surgical History:  Procedure Laterality Date  . BREAST BIOPSY     lt x3  . CARPOMETACARPEL SUSPENSION PLASTY Left 03/08/2014   Procedure: LEFT THUMB CARPOMETACARPEL ([place]) SUSPENSION PLASTY WITH TRAPEZIUM EXCISION;  Surgeon: [name]., MD;  Location: [place];  Service: Orthopedics;  Laterality: Left;  . CATARACT EXTRACTION Bilateral   . CESAREAN SECTION    . CHOLECYSTECTOMY    . COLONOSCOPY  03/26/2007   RMR:  Diffuse changes of proctocolitis as described above diminutive rectal polyp, cold biopsy/removed, pedunculated polyp of the splenic flexure hot snare removed.  Segmental biopsies the colon taken of the ileocecal valve ulcer biopsied normal terminal ileum  . COLONOSCOPY N/A 01/05/2014   Procedure: COLONOSCOPY;  Surgeon: Rogene Houston, MD;  [facility];  Service: Endoscopy;  Laterality: N/A;  830  . EYE SURGERY    . GASTROCNEMIUS RECESSION Right 08/07/2016   Procedure: GASTROCNEMIUS RECESSION;  Surgeon: Newt Minion, MD;  Location: [place];  Service: Orthopedics;  Laterality: Right;  . Joint fusion-foot     x2 LEFT FOOT  . KNEE ARTHROPLASTY  2001   Right  . KNEE ARTHROPLASTY  2002  Left  . LEFT HEART CATH AND CORONARY ANGIOGRAPHY N/A 06/18/2018   Procedure: LEFT HEART CATH AND CORONARY ANGIOGRAPHY;  Surgeon: Burnell Blanks, MD;  Location: [place] [facility];  Service: Cardiovascular;  Laterality: N/A;  . Left leg vein resection    . ROTATOR CUFF REPAIR     LEFT  . THYROIDECTOMY  1980   3/4  . TONSILLECTOMY    . TUBAL LIGATION    . WEIL OSTEOTOMY Right 08/07/2016   Procedure: WEIL OSTEOTOMY 2nd and 3rd Metatarsal Right Foot;  Surgeon: Newt Minion, MD;  Location: [place];  Service:  Orthopedics;  Laterality: Right;  . WEIL OSTEOTOMY Left 12/25/2016   Procedure: 2nd Toe Proximal Interphalangeal Resection, Weil Osteotomy 2nd and 3rd Metatarsal Left Foot;  Surgeon: Newt Minion, MD;  Location: [place];  Service: Orthopedics;  Laterality: Left;     Current Meds  Medication Sig  . aspirin EC 81 MG tablet Take 81 mg by mouth at bedtime.   . docusate sodium (COLACE) 100 MG capsule Take 100 mg by mouth at bedtime.  Marland Kitchen FLUoxetine (PROZAC) 20 MG capsule Take 20 mg by mouth every morning.   . furosemide (LASIX) 40 MG tablet Take 40 mg by mouth daily.  Marland Kitchen ibuprofen (ADVIL,MOTRIN) 200 MG tablet Take 400 mg by mouth daily.   Javier Docker Oil 500 MG CAPS Take 500 mg by mouth daily.  Marland Kitchen levothyroxine (SYNTHROID, LEVOTHROID) 100 MCG tablet Take 100 mcg by mouth daily before breakfast.   . loratadine (CLARITIN) 10 MG tablet Take 10 mg by mouth daily.   Marland Kitchen losartan (COZAAR) 100 MG tablet Take 1 tablet (100 mg total) by mouth every evening.  . meclizine (ANTIVERT) 25 MG tablet Take 25 mg by mouth 3 (three) times daily as needed for dizziness.   . metFORMIN (GLUCOPHAGE-XR) 500 MG 24 hr tablet Take 500 mg by mouth 2 (two) times a day.   . metoprolol succinate (TOPROL-XL) 100 MG 24 hr tablet Take 100 mg by mouth every morning. Take with or immediately following a meal.  . omeprazole (PRILOSEC) 20 MG capsule Take 20 mg by mouth daily before breakfast.  . simvastatin (ZOCOR) 40 MG tablet Take 40 mg by mouth at bedtime.  . triamcinolone cream (KENALOG) 0.5 % Apply 1 application topically 3 (three) times daily as needed. For rash/irritation.  . Vitamin D, Cholecalciferol, 1000 UNITS CAPS Take 1,000 mg by mouth 2 (two) times daily.     Allergies:   Ephedrine; Hibiclens [chlorhexidine gluconate]; and Keflex [cephalexin]   Social History   Tobacco Use  . Smoking status: Never Smoker  . Smokeless tobacco: Never Used  Substance Use Topics  . Alcohol use: Yes    Alcohol/week: 0.0 standard drinks     Comment: rare-- glass of wine at christmas  . Drug use: No     Family Hx: The patient's family history includes Arthritis in her father; Asthma in her paternal grandfather; Diabetes in her father; Emphysema in her paternal grandfather; Heart failure in her father; Hypertension in her father and mother.  ROS:   Please see the history of present illness.    All other systems reviewed and are negative.   Prior CV studies:   The following studies were reviewed today:  Cardiac catheterization 06/18/2018:  Mid RCA lesion is 20% stenosed.  Prox LAD to Mid LAD lesion is 30% stenosed.  Ost 1st Diag lesion is 20% stenosed.  1. Mild non-obstructive CAD 2. Mild elevation LVEDP  Recommendations: Medical  management of CAD.   Echocardiogram 05/20/2018: Study Conclusions  - Left ventricle: The cavity size was normal. Wall thickness was increased in a pattern of mild LVH. Systolic function was normal. The estimated ejection fraction was in the range of 55% to 60%. Wall motion was normal; there were no regional wall motion abnormalities. Doppler parameters are consistent with abnormal left ventricular relaxation (grade 1 diastolic dysfunction). - Aortic valve: Mildly calcified annulus. Trileaflet; mildly calcified leaflets. - Mitral valve: Mildly calcified annulus. - Left atrium: The atrium was at the upper limits of normal in size. - Right atrium: Central venous pressure (est): 3 mm Hg. - Atrial septum: No defect or patent foramen ovale was identified. - Tricuspid valve: There was trivial regurgitation. - Pulmonary arteries: PA peak pressure: 12 mm Hg (S). - Pericardium, extracardiac: There was no pericardial effusion.  Labs/Other Tests and Data Reviewed:    EKG:  An ECG dated 06/18/2018 was personally reviewed today and demonstrated:  Sinus rhythm with intermittent left bundle branch block.  Recent Labs: 06/12/2018: BUN 17; Creatinine, Ser 1.17; Hemoglobin 14.4;  Platelets 223; Potassium 3.9; Sodium 142    Wt Readings from Last 3 Encounters:  03/03/19 253 lb (114.8 kg)  08/31/18 257 lb 3.2 oz (116.7 kg)  06/18/18 260 lb (117.9 kg)     Objective:    Vital Signs:  BP (!) 147/76   Pulse 70   Temp (!) 97.4 F (36.3 C)   Ht 5\' 5"  (1.651 m)   Wt 253 lb (114.8 kg)   SpO2 97%   BMI 42.10 kg/m    General: Patient appeared comfortable at rest seated in her home. HEENT: Conjunctiva and lids normal. Lungs: Patient spoke in full sentences, no breathlessness.  No audible wheezing. Skin: Normal appearing color and turgor. Neuropsychiatric: Gaze conjugate.  Speech pattern normal.  Patient observed to stand up and walk.  Affect appropriate.  ASSESSMENT & PLAN:    1.  History of PVCs and NSVT.  She is doing well symptomatically at this time, no progressive palpitations or syncope.  LVEF normal and also no obstructive CAD by cardiac catheterization.  Continue Toprol-XL.  2.  Intermittent left bundle branch block.  3.  Essential hypertension, continue medical therapy including Cozaar and Toprol-XL.  4.  Mixed hyperlipidemia, on Zocor.  Requesting lab work from Dr. Nadara Mustard.  COVID-19 Education: The signs and symptoms of COVID-19 were discussed with the patient and how to seek care for testing (follow up with PCP or arrange E-visit).  The importance of social distancing was discussed today.  Time:    Today, I have spent 7 minutes with the patient with telehealth technology discussing the above problems.     Medication Adjustments/Labs and Tests Ordered: Current medicines are reviewed at length with the patient today.  Concerns regarding medicines are outlined above.   Tests Ordered: No orders of the defined types were placed in this encounter.   Medication Changes: No orders of the defined types were placed in this encounter.   Disposition:  Follow up 67-month office visit in [place].  Signed, Rozann Lesches, MD  03/03/2019 1:50 PM    [place]

## 2019-03-03 ENCOUNTER — Telehealth (INDEPENDENT_AMBULATORY_CARE_PROVIDER_SITE_OTHER): Payer: Medicare Other | Admitting: Cardiology

## 2019-03-03 ENCOUNTER — Encounter: Payer: Self-pay | Admitting: *Deleted

## 2019-03-03 ENCOUNTER — Encounter: Payer: Self-pay | Admitting: Cardiology

## 2019-03-03 VITALS — BP 147/76 | HR 70 | Temp 97.4°F | Ht 65.0 in | Wt 253.0 lb

## 2019-03-03 DIAGNOSIS — I493 Ventricular premature depolarization: Secondary | ICD-10-CM

## 2019-03-03 DIAGNOSIS — I447 Left bundle-branch block, unspecified: Secondary | ICD-10-CM

## 2019-03-03 DIAGNOSIS — Z7189 Other specified counseling: Secondary | ICD-10-CM | POA: Diagnosis not present

## 2019-03-03 DIAGNOSIS — I10 Essential (primary) hypertension: Secondary | ICD-10-CM | POA: Diagnosis not present

## 2019-03-03 DIAGNOSIS — E782 Mixed hyperlipidemia: Secondary | ICD-10-CM

## 2019-03-03 NOTE — Patient Instructions (Addendum)

## 2019-05-08 DIAGNOSIS — R42 Dizziness and giddiness: Secondary | ICD-10-CM | POA: Diagnosis not present

## 2019-05-08 DIAGNOSIS — Z6841 Body Mass Index (BMI) 40.0 and over, adult: Secondary | ICD-10-CM | POA: Diagnosis not present

## 2019-05-08 DIAGNOSIS — Z23 Encounter for immunization: Secondary | ICD-10-CM | POA: Diagnosis not present

## 2019-05-12 ENCOUNTER — Other Ambulatory Visit: Payer: Self-pay | Admitting: Family Medicine

## 2019-05-12 DIAGNOSIS — R42 Dizziness and giddiness: Secondary | ICD-10-CM

## 2019-05-12 DIAGNOSIS — H8109 Meniere's disease, unspecified ear: Secondary | ICD-10-CM

## 2019-06-03 ENCOUNTER — Ambulatory Visit
Admission: RE | Admit: 2019-06-03 | Discharge: 2019-06-03 | Disposition: A | Discharge status: Home / Self Care | Payer: Medicare Other | Source: Ambulatory Visit | Attending: Family Medicine | Admitting: Family Medicine

## 2019-06-03 DIAGNOSIS — R42 Dizziness and giddiness: Secondary | ICD-10-CM | POA: Diagnosis not present

## 2019-06-03 DIAGNOSIS — H8109 Meniere's disease, unspecified ear: Secondary | ICD-10-CM

## 2019-06-10 ENCOUNTER — Telehealth: Payer: Self-pay | Admitting: Cardiology

## 2019-06-10 NOTE — Telephone Encounter (Signed)
Virtual Visit Pre-Appointment Phone Call  "(Name), I am calling you today to discuss your upcoming appointment. We are currently trying to limit exposure to the virus that causes COVID-19 by seeing patients at home rather than in the office."  1. "What is the BEST phone number to call the day of the visit?" - include this in appointment notes  2. Do you have or have access to (through a family member/friend) a smartphone with video capability that we can use for your visit?" a. If yes - list this number in appt notes as cell (if different from BEST phone #) and list the appointment type as a VIDEO visit in appointment notes b. If no - list the appointment type as a PHONE visit in appointment notes  3. Confirm consent - "In the setting of the current Covid19 crisis, you are scheduled for a (phone or video) visit with your provider on (date) at (time).  Just as we do with many in-office visits, in order for you to participate in this visit, we must obtain consent.  If you'd like, I can send this to your mychart (if signed up) or email for you to review.  Otherwise, I can obtain your verbal consent now.  All virtual visits are billed to your insurance company just like a normal visit would be.  By agreeing to a virtual visit, we'd like you to understand that the technology does not allow for your provider to perform an examination, and thus may limit your provider's ability to fully assess your condition. If your provider identifies any concerns that need to be evaluated in person, we will make arrangements to do so.  Finally, though the technology is pretty good, we cannot assure that it will always work on either your or our end, and in the setting of a video visit, we may have to convert it to a phone-only visit.  In either situation, we cannot ensure that we have a secure connection.  Are you willing to proceed?" STAFF: Did the patient verbally acknowledge consent to telehealth visit? Document  YES/NO here: yes  4. Advise patient to be prepared - "Two hours prior to your appointment, go ahead and check your blood pressure, pulse, oxygen saturation, and your weight (if you have the equipment to check those) and write them all down. When your visit starts, your provider will ask you for this information. If you have an Apple Watch or Kardia device, please plan to have heart rate information ready on the day of your appointment. Please have a pen and paper handy nearby the day of the visit as well."  5. Give patient instructions for MyChart download to smartphone OR Doximity/[email or web address] as below if video visit (depending on what platform provider is using)  6. Inform patient they will receive a phone call 15 minutes prior to their appointment time (may be from unknown caller ID) so they should be prepared to answer    TELEPHONE CALL NOTE  HORTENSIA DUFFIN has been deemed a candidate for a follow-up tele-health visit to limit community exposure during the Covid-19 pandemic. I spoke with the patient via phone to ensure availability of phone/video source, confirm preferred email & phone number, and discuss instructions and expectations.  I reminded Grace Delacruz to be prepared with any vital sign and/or heart rhythm information that could potentially be obtained via home monitoring, at the time of her visit. I reminded Grace Delacruz to expect a phone call prior to  her visit.  Weston Anna 06/10/2019 12:52 PM   INSTRUCTIONS FOR DOWNLOADING THE MYCHART APP TO SMARTPHONE  - The patient must first make sure to have activated MyChart and know their login information - If Apple, go to [employer] and type in MyChart in the search bar and download the app. If Android, ask patient to go to [employer] and type in Vincent in the search bar and download the app. The app is free but as with any other app downloads, their phone may require them to verify saved payment information or  Apple/Android password.  - The patient will need to then log into the app with their MyChart username and password, and select [facility] as their healthcare provider to link the account. When it is time for your visit, go to the MyChart app, find appointments, and click Begin Video Visit. Be sure to Select Allow for your device to access the Microphone and Camera for your visit. You will then be connected, and your provider will be with you shortly.  **If they have any issues connecting, or need assistance please contact MyChart service desk [phone])**  **If using a computer, in order to ensure the best quality for their visit they will need to use either of the following Internet Browsers: [employer], or Google Chrome**  IF USING DOXIMITY or DOXY.ME - The patient will receive a link just prior to their visit by text.     FULL LENGTH CONSENT FOR TELE-HEALTH VISIT   I hereby voluntarily request, consent and authorize [facility] and its employed or contracted physicians, physician assistants, nurse practitioners or other licensed health care professionals (the Practitioner), to provide me with telemedicine health care services (the Services") as deemed necessary by the treating Practitioner. I acknowledge and consent to receive the Services by the Practitioner via telemedicine. I understand that the telemedicine visit will involve communicating with the Practitioner through live audiovisual communication technology and the disclosure of certain medical information by electronic transmission. I acknowledge that I have been given the opportunity to request an in-person assessment or other available alternative prior to the telemedicine visit and am voluntarily participating in the telemedicine visit.  I understand that I have the right to withhold or withdraw my consent to the use of telemedicine in the course of my care at any time, without affecting my right to future care  or treatment, and that the Practitioner or I may terminate the telemedicine visit at any time. I understand that I have the right to inspect all information obtained and/or recorded in the course of the telemedicine visit and may receive copies of available information for a reasonable fee.  I understand that some of the potential risks of receiving the Services via telemedicine include:   Delay or interruption in medical evaluation due to technological equipment failure or disruption;  Information transmitted may not be sufficient (e.g. poor resolution of images) to allow for appropriate medical decision making by the Practitioner; and/or   In rare instances, security protocols could fail, causing a breach of personal health information.  Furthermore, I acknowledge that it is my responsibility to provide information about my medical history, conditions and care that is complete and accurate to the best of my ability. I acknowledge that Practitioner's advice, recommendations, and/or decision may be based on factors not within their control, such as incomplete or inaccurate data provided by me or distortions of diagnostic images or specimens that may result from electronic transmissions. I  understand that the practice of medicine is not an exact science and that Practitioner makes no warranties or guarantees regarding treatment outcomes. I acknowledge that I will receive a copy of this consent concurrently upon execution via email to the email address I last provided but may also request a printed copy by calling the office of [facility].    I understand that my insurance will be billed for this visit.   I have read or had this consent read to me.  I understand the contents of this consent, which adequately explains the benefits and risks of the Services being provided via telemedicine.   I have been provided ample opportunity to ask questions regarding this consent and the Services and have had  my questions answered to my satisfaction.  I give my informed consent for the services to be provided through the use of telemedicine in my medical care  By participating in this telemedicine visit I agree to the above.

## 2019-06-15 DIAGNOSIS — L570 Actinic keratosis: Secondary | ICD-10-CM | POA: Diagnosis not present

## 2019-07-21 DIAGNOSIS — E039 Hypothyroidism, unspecified: Secondary | ICD-10-CM | POA: Diagnosis not present

## 2019-07-21 DIAGNOSIS — I10 Essential (primary) hypertension: Secondary | ICD-10-CM | POA: Diagnosis not present

## 2019-07-26 DIAGNOSIS — Z23 Encounter for immunization: Secondary | ICD-10-CM | POA: Diagnosis not present

## 2019-08-18 ENCOUNTER — Encounter: Payer: Self-pay | Admitting: Cardiology

## 2019-08-18 DIAGNOSIS — E7800 Pure hypercholesterolemia, unspecified: Secondary | ICD-10-CM | POA: Diagnosis not present

## 2019-08-18 DIAGNOSIS — E1165 Type 2 diabetes mellitus with hyperglycemia: Secondary | ICD-10-CM | POA: Diagnosis not present

## 2019-08-18 DIAGNOSIS — I10 Essential (primary) hypertension: Secondary | ICD-10-CM | POA: Diagnosis not present

## 2019-08-18 DIAGNOSIS — E039 Hypothyroidism, unspecified: Secondary | ICD-10-CM | POA: Diagnosis not present

## 2019-08-18 DIAGNOSIS — M419 Scoliosis, unspecified: Secondary | ICD-10-CM | POA: Diagnosis not present

## 2019-08-20 DIAGNOSIS — I10 Essential (primary) hypertension: Secondary | ICD-10-CM | POA: Diagnosis not present

## 2019-08-20 DIAGNOSIS — E039 Hypothyroidism, unspecified: Secondary | ICD-10-CM | POA: Diagnosis not present

## 2019-08-23 DIAGNOSIS — E1165 Type 2 diabetes mellitus with hyperglycemia: Secondary | ICD-10-CM | POA: Diagnosis not present

## 2019-08-23 DIAGNOSIS — L570 Actinic keratosis: Secondary | ICD-10-CM | POA: Diagnosis not present

## 2019-08-29 ENCOUNTER — Encounter: Payer: Self-pay | Admitting: Cardiology

## 2019-08-29 NOTE — Progress Notes (Signed)
Virtual Visit via Video Note   This visit type was conducted due to national recommendations for restrictions regarding the COVID-19 Pandemic (e.g. social distancing) in an effort to limit this patient's exposure and mitigate transmission in our community.  Due to her co-morbid illnesses, this patient is at least at moderate risk for complications without adequate follow up.  This format is felt to be most appropriate for this patient at this time.  All issues noted in this document were discussed and addressed.  A limited physical exam was performed with this format.  Please refer to the patient's chart for her consent to telehealth for [facility]   Date:  08/30/2019   ID:  Grace Delacruz, DOB [birth year], MRN [id number]  Patient Location: Home Provider Location: Office  PCP:  Rory Percy, MD  Cardiologist:  Rozann Lesches, MD Electrophysiologist:  None   Evaluation Performed:  Follow-Up Visit  Chief Complaint:   Cardiac follow-up  History of Present Illness:    Grace Delacruz is a 73 y.o. female last assessed via telehealth encounter in May.  We communicated by video conferencing today.  She states that she has been doing well, no chest pain or palpitations.  Remains functional with ADLs.  She has been focused on her diet, has lost about 15 pounds.  States that her glucose is under better control with recent drop in hemoglobin A1c to 6%.  LDL is also down to 91.  I am requesting the remainder of her lab work from PCP.  We went over her medications.  I did talk with her about the possibility of switching to a higher potency statin to try to get her LDL under 70.  She is going to continue to work on diet and weight loss and consider that at next assessment.  Otherwise no changes anticipated at this time.  The patient does not have symptoms concerning for COVID-19 infection (fever, chills, cough, or new shortness of breath).    Past Medical History:  Diagnosis Date  . Anxiety    . Depression   . DJD (degenerative joint disease)   . Esophageal reflux   . Essential hypertension   . Fibrocystic breast disease   . Hypothyroidism   . Left bundle branch block    Transient - possibly rate related   . Menopause   . Osteoarthritis   . PONV (postoperative nausea and vomiting)    pt. would like to have a scopolamine patch  . Restless leg syndrome   . Scoliosis   . Type 2 diabetes mellitus ([place])    Past Surgical History:  Procedure Laterality Date  . BREAST BIOPSY     lt x3  . CARPOMETACARPEL SUSPENSION PLASTY Left 03/08/2014   Procedure: LEFT THUMB CARPOMETACARPEL ([place]) SUSPENSION PLASTY WITH TRAPEZIUM EXCISION;  Surgeon: [name]., MD;  Location: [facility];  Service: Orthopedics;  Laterality: Left;  . CATARACT EXTRACTION Bilateral   . CESAREAN SECTION    . CHOLECYSTECTOMY    . COLONOSCOPY  03/26/2007   RMR:  Diffuse changes of proctocolitis as described above diminutive rectal polyp, cold biopsy/removed, pedunculated polyp of the splenic flexure hot snare removed.  Segmental biopsies the colon taken of the ileocecal valve ulcer biopsied normal terminal ileum  . COLONOSCOPY N/A 01/05/2014   Procedure: COLONOSCOPY;  Surgeon: Rogene Houston, MD;  [facility];  Service: Endoscopy;  Laterality: N/A;  830  . EYE SURGERY    . GASTROCNEMIUS RECESSION Right 08/07/2016  Procedure: GASTROCNEMIUS RECESSION;  Surgeon: Newt Minion, MD;  Location: [facility];  Service: Orthopedics;  Laterality: Right;  . Joint fusion-foot     x2 LEFT FOOT  . KNEE ARTHROPLASTY  2001   Right  . KNEE ARTHROPLASTY  2002   Left  . LEFT HEART CATH AND CORONARY ANGIOGRAPHY N/A 06/18/2018   Procedure: LEFT HEART CATH AND CORONARY ANGIOGRAPHY;  Surgeon: Burnell Blanks, MD;  Location: [facility];  Service: Cardiovascular;  Laterality: N/A;  . Left leg vein resection    . ROTATOR CUFF REPAIR     LEFT  . THYROIDECTOMY  1980   3/4  . TONSILLECTOMY     . TUBAL LIGATION    . WEIL OSTEOTOMY Right 08/07/2016   Procedure: WEIL OSTEOTOMY 2nd and 3rd Metatarsal Right Foot;  Surgeon: Newt Minion, MD;  Location: [facility];  Service: Orthopedics;  Laterality: Right;  . WEIL OSTEOTOMY Left 12/25/2016   Procedure: 2nd Toe Proximal Interphalangeal Resection, Weil Osteotomy 2nd and 3rd Metatarsal Left Foot;  Surgeon: Newt Minion, MD;  Location: [facility];  Service: Orthopedics;  Laterality: Left;     Current Meds  Medication Sig  . aspirin EC 81 MG tablet Take 81 mg by mouth at bedtime.   . docusate sodium (COLACE) 100 MG capsule Take 100 mg by mouth at bedtime.  Marland Kitchen FLUoxetine (PROZAC) 20 MG capsule Take 20 mg by mouth every morning.   . furosemide (LASIX) 40 MG tablet Take 40 mg by mouth daily.  Marland Kitchen ibuprofen (ADVIL,MOTRIN) 200 MG tablet Take 400 mg by mouth daily.   Javier Docker Oil 500 MG CAPS Take 500 mg by mouth daily.  Marland Kitchen levothyroxine (SYNTHROID, LEVOTHROID) 100 MCG tablet Take 100 mcg by mouth daily before breakfast.   . loratadine (CLARITIN) 10 MG tablet Take 10 mg by mouth daily.   Marland Kitchen losartan (COZAAR) 100 MG tablet Take 1 tablet (100 mg total) by mouth every evening.  . meclizine (ANTIVERT) 25 MG tablet Take 25 mg by mouth 3 (three) times daily as needed for dizziness.   . metFORMIN (GLUCOPHAGE-XR) 500 MG 24 hr tablet Take 500 mg by mouth 2 (two) times a day.   . metoprolol succinate (TOPROL-XL) 100 MG 24 hr tablet Take 100 mg by mouth every morning. Take with or immediately following a meal.  . omeprazole (PRILOSEC) 20 MG capsule Take 20 mg by mouth daily before breakfast.  . simvastatin (ZOCOR) 40 MG tablet Take 40 mg by mouth at bedtime.  . triamcinolone cream (KENALOG) 0.5 % Apply 1 application topically 3 (three) times daily as needed. For rash/irritation.  . Vitamin D, Cholecalciferol, 1000 UNITS CAPS Take 1,000 mg by mouth 2 (two) times daily.     Allergies:   Ephedrine, Hibiclens [chlorhexidine gluconate], and Keflex [cephalexin]   Social  History   Tobacco Use  . Smoking status: Never Smoker  . Smokeless tobacco: Never Used  Substance Use Topics  . Alcohol use: Yes    Alcohol/week: 0.0 standard drinks    Comment: rare-- glass of wine at christmas  . Drug use: No     Family Hx: The patient's family history includes Arthritis in her father; Asthma in her paternal grandfather; Diabetes in her father; Emphysema in her paternal grandfather; Heart failure in her father; Hypertension in her father and mother.  ROS:   Please see the history of present illness. All other systems reviewed and are negative.   Prior CV studies:   The following studies  were reviewed today:  Cardiac catheterization 06/18/2018:  Mid RCA lesion is 20% stenosed.  Prox LAD to Mid LAD lesion is 30% stenosed.  Ost 1st Diag lesion is 20% stenosed.  1. Mild non-obstructive CAD 2. Mild elevation LVEDP  Recommendations: Medical management of CAD.  Echocardiogram7/31/2019: Study Conclusions  - Left ventricle: The cavity size was normal. Wall thickness was increased in a pattern of mild LVH. Systolic function was normal. The estimated ejection fraction was in the range of 55% to 60%. Wall motion was normal; there were no regional wall motion abnormalities. Doppler parameters are consistent with abnormal left ventricular relaxation (grade 1 diastolic dysfunction). - Aortic valve: Mildly calcified annulus. Trileaflet; mildly calcified leaflets. - Mitral valve: Mildly calcified annulus. - Left atrium: The atrium was at the upper limits of normal in size. - Right atrium: Central venous pressure (est): 3 mm Hg. - Atrial septum: No defect or patent foramen ovale was identified. - Tricuspid valve: There was trivial regurgitation. - Pulmonary arteries: PA peak pressure: 12 mm Hg (S). - Pericardium, extracardiac: There was no pericardial effusion.  Labs/Other Tests and Data Reviewed:    EKG:  An ECG dated 06/18/2018 was  personally reviewed today and demonstrated:  Sinus rhythm with intermittent left bundle branch block.  Recent Labs:  April 2020: BUN 19, creatinine 0.98, potassium 4.0, AST 14, ALT 16, cholesterol 212, triglycerides 244, HDL 41, LDL 122, hemoglobin 14.3, platelets 237, hemoglobin A1c 7.2%  Wt Readings from Last 3 Encounters:  08/30/19 248 lb (112.5 kg)  03/03/19 253 lb (114.8 kg)  08/31/18 257 lb 3.2 oz (116.7 kg)     Objective:    Vital Signs:  BP 127/66   Pulse 76   Ht 5\' 5"  (1.651 m)   Wt 248 lb (112.5 kg)   SpO2 97%   BMI 41.27 kg/m    General: Patient appears comfortable at rest. HEENT: Conjunctiva and lids normal. Skin: Normal appearance of color. Lungs: Patient spoke in full sentences, not short of breath.  No audible wheezing or coughing. Musculoskeletal: No kyphosis. Neuropsychiatric: Gaze conjugate.  Speech pattern normal.  Patient observed to ambulate in her home.  Affect appropriate.  ASSESSMENT & PLAN:    1.  History of frequent PVCs and NSVT.  She is doing well at this time on Toprol-XL.  No dizziness or syncope.  Cardiac work-up includes documentation of normal LVEF and only mild coronary atherosclerosis.  2.  Intermittent left bundle branch block.  3.  Essential hypertension, systolics in the [id number] today.  No change to current regimen.  She is also working on diet and weight loss.  4.  Mixed hyperlipidemia.  Most recent LDL 91 on moderate intensity Zocor.  Requesting interval lab work.  She would like to continue to work on diet and weight loss.  Can consider higher potency statin if needed particularly with diabetes mellitus and atherosclerosis.  COVID-19 Education: The signs and symptoms of COVID-19 were discussed with the patient and how to seek care for testing (follow up with PCP or arrange E-visit).  The importance of social distancing was discussed today.  Time:   Today, I have spent 8 minutes with the patient with telehealth technology discussing the  above problems.     Medication Adjustments/Labs and Tests Ordered: Current medicines are reviewed at length with the patient today.  Concerns regarding medicines are outlined above.   Tests Ordered: No orders of the defined types were placed in this encounter.   Medication Changes: No orders of  the defined types were placed in this encounter.   Follow Up:  Either In Person or Virtual 6 months.  Signed, Rozann Lesches, MD  08/30/2019 11:44 AM    Grace Delacruz

## 2019-08-30 ENCOUNTER — Telehealth (INDEPENDENT_AMBULATORY_CARE_PROVIDER_SITE_OTHER): Payer: Medicare Other | Admitting: Cardiology

## 2019-08-30 ENCOUNTER — Encounter: Payer: Self-pay | Admitting: *Deleted

## 2019-08-30 ENCOUNTER — Encounter: Payer: Self-pay | Admitting: Cardiology

## 2019-08-30 VITALS — BP 127/66 | HR 76 | Ht 65.0 in | Wt 248.0 lb

## 2019-08-30 DIAGNOSIS — I10 Essential (primary) hypertension: Secondary | ICD-10-CM | POA: Diagnosis not present

## 2019-08-30 DIAGNOSIS — E782 Mixed hyperlipidemia: Secondary | ICD-10-CM

## 2019-08-30 DIAGNOSIS — I493 Ventricular premature depolarization: Secondary | ICD-10-CM | POA: Diagnosis not present

## 2019-08-30 DIAGNOSIS — I2510 Atherosclerotic heart disease of native coronary artery without angina pectoris: Secondary | ICD-10-CM

## 2019-08-30 DIAGNOSIS — I447 Left bundle-branch block, unspecified: Secondary | ICD-10-CM

## 2019-08-30 NOTE — Patient Instructions (Addendum)

## 2019-10-27 ENCOUNTER — Encounter (INDEPENDENT_AMBULATORY_CARE_PROVIDER_SITE_OTHER): Payer: Medicare Other | Admitting: Ophthalmology

## 2019-10-27 DIAGNOSIS — H353122 Nonexudative age-related macular degeneration, left eye, intermediate dry stage: Secondary | ICD-10-CM | POA: Diagnosis not present

## 2019-10-27 DIAGNOSIS — H33301 Unspecified retinal break, right eye: Secondary | ICD-10-CM | POA: Diagnosis not present

## 2019-10-27 DIAGNOSIS — I10 Essential (primary) hypertension: Secondary | ICD-10-CM

## 2019-10-27 DIAGNOSIS — H35033 Hypertensive retinopathy, bilateral: Secondary | ICD-10-CM

## 2019-10-27 DIAGNOSIS — H35371 Puckering of macula, right eye: Secondary | ICD-10-CM | POA: Diagnosis not present

## 2019-10-27 DIAGNOSIS — H43813 Vitreous degeneration, bilateral: Secondary | ICD-10-CM

## 2019-11-03 ENCOUNTER — Encounter (INDEPENDENT_AMBULATORY_CARE_PROVIDER_SITE_OTHER): Payer: No Typology Code available for payment source | Admitting: Ophthalmology

## 2019-11-09 IMAGING — MR MRI HEAD WITHOUT CONTRAST
10 series · 48 of 48 positions shown · non-contrast
Comparison: None.

CLINICAL DATA: Chronic intermittent dizziness and blurred vision
with increased frequency of symptoms for 2 months.

EXAM:
MRI HEAD WITHOUT CONTRAST
TECHNIQUE: Multiplanar, multiecho pulse sequences of the brain and surrounding
structures were obtained without intravenous contrast.

[Series 5: T1 · sagittal · 4.0mm · 0.75mm/px · 3 of 31 slices shown (1 of 2)]
[im 1/31]
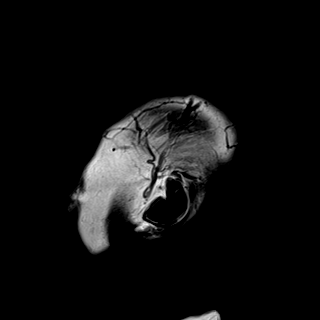
[im 16/31]
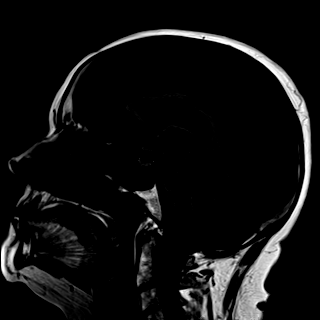
[im 31/31]
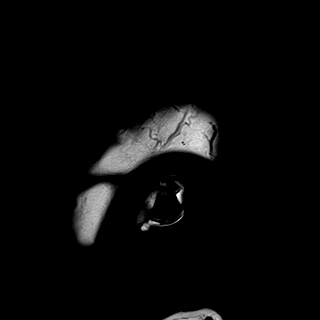

[Series 6: DWI · axial · 3.0mm · 1.44mm/px · z∈[-43,+103]mm · 8 of 92 slices shown (1 of 4)]
[im 1/92]
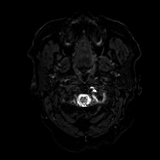
[im 14/92]
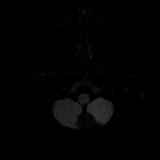
[im 27/92]
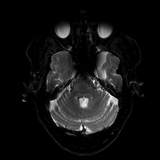
[im 40/92]
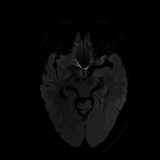
[im 53/92]
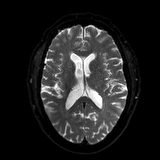
[im 66/92]
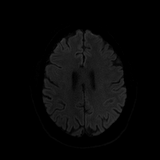
[im 79/92]
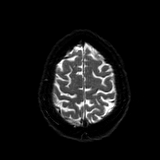
[im 92/92]
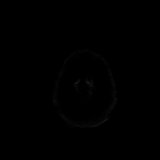

[Series 7: DWI · axial · 3.0mm · 1.44mm/px · z∈[-43,+103]mm · 4 of 46 slices shown (2 of 4)]
[im 1/46]
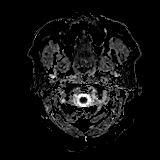
[im 16/46]
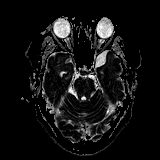
[im 31/46]
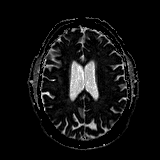
[im 46/46]
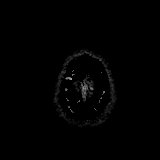

[Series 8: DWI · coronal · 5.0mm · 1.44mm/px · 5 of 60 slices shown (3 of 4)]
[im 1/60]
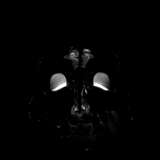
[im 15/60]
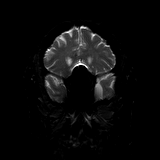
[im 30/60]
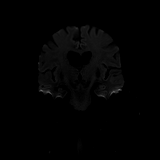
[im 45/60]
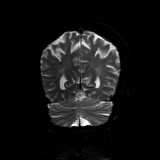
[im 60/60]
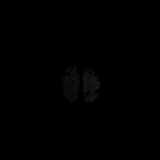

[Series 9: DWI · coronal · 5.0mm · 1.44mm/px · 2 of 30 slices shown (4 of 4)]
[im 1/30]
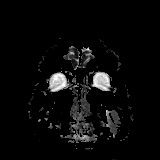
[im 30/30]
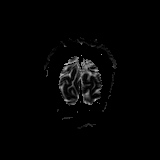

[Series 10: T2 · axial · 4.0mm · 0.45mm/px · z∈[-41,+102]mm · 2 of 29 slices shown (1 of 2)]
[im 1/29]
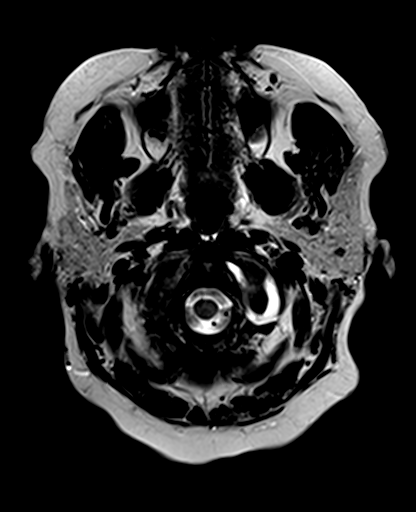
[im 29/29]
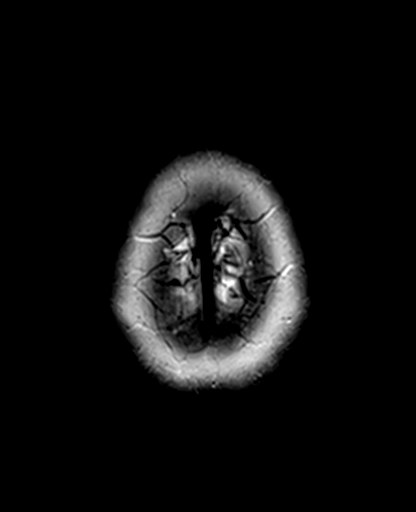

[Series 11: FLAIR · axial · 3.0mm · 0.72mm/px · z∈[-44,+104]mm · 2 of 26 slices shown]
[im 1/26]
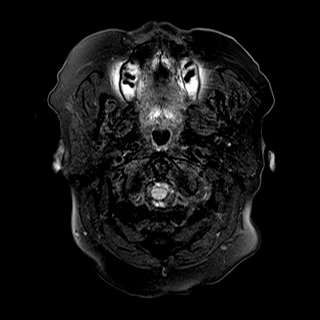
[im 26/26]
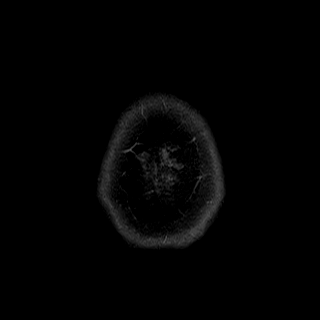

[Series 13: swi_images · axial · 1.5mm · 0.90mm/px · z∈[-39,+101]mm · 8 of 96 slices shown]
[im 1/96]
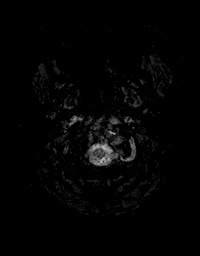
[im 14/96]
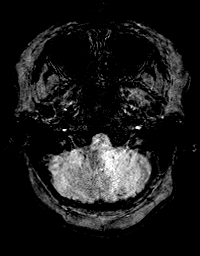
[im 28/96]
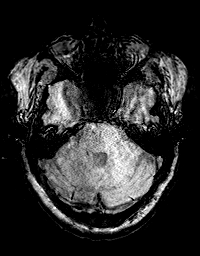
[im 41/96]
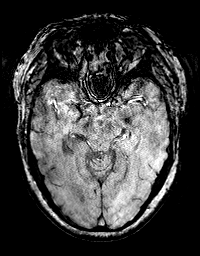
[im 55/96]
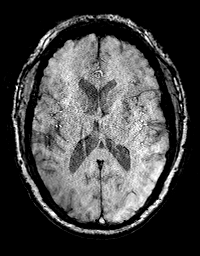
[im 68/96]
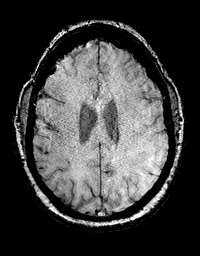
[im 82/96]
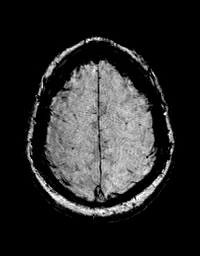
[im 96/96]
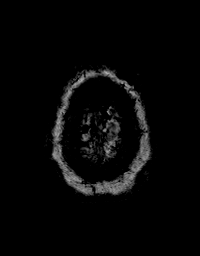

[Series 14: T1 · axial · 1.0mm · 0.90mm/px · z∈[-40,+101]mm · 12 of 144 slices shown (2 of 2)]
[im 1/144]
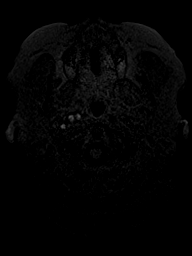
[im 14/144]
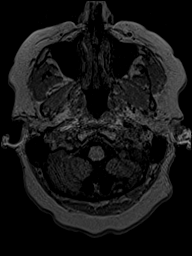
[im 27/144]
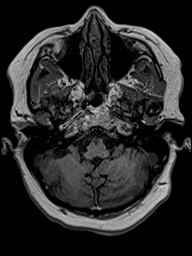
[im 40/144]
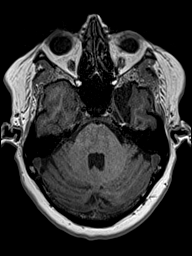
[im 53/144]
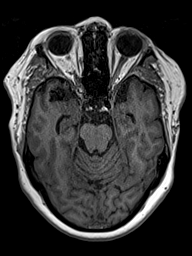
[im 66/144]
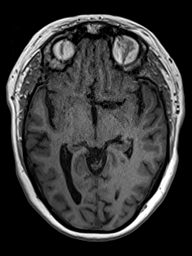
[im 79/144]
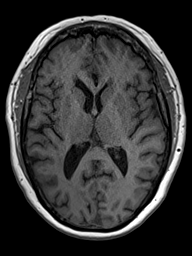
[im 92/144]
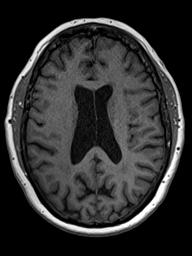
[im 105/144]
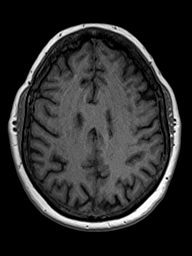
[im 118/144]
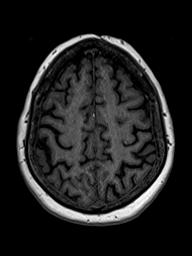
[im 131/144]
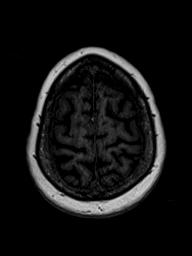
[im 144/144]
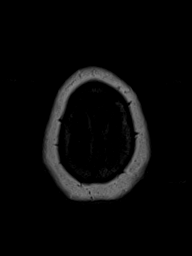

[Series 15: T2 · coronal · 4.5mm · 0.36mm/px · 2 of 30 slices shown (2 of 2)]
[im 1/30]
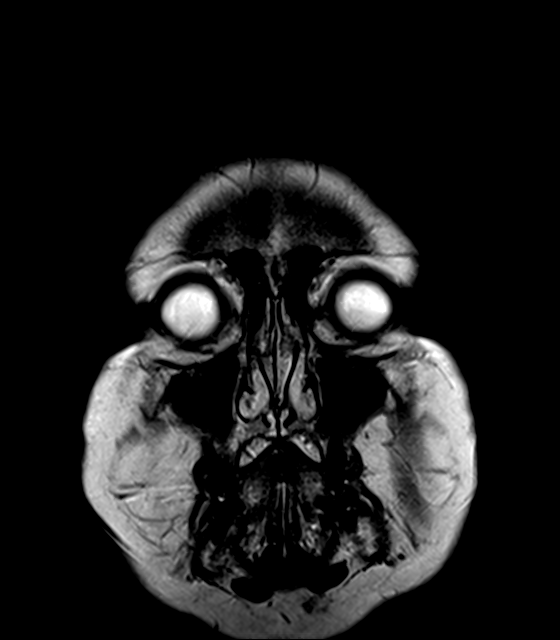
[im 30/30]
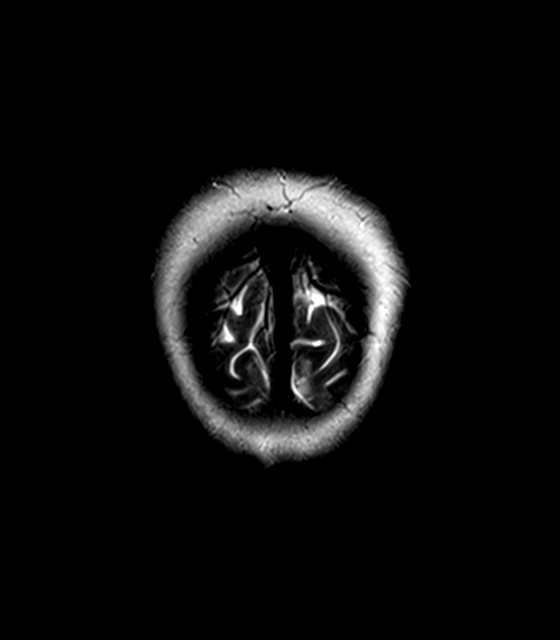

[48 of 48 positions shown; findings below may reference images not displayed]

FINDINGS: Brain: There is no evidence of acute infarct, intracranial
hemorrhage, mass, midline shift, or extra-axial fluid collection.
Mild cerebral atrophy is within normal limits for age. Patchy T2
hyperintensities in the cerebral white matter bilaterally are
nonspecific but compatible with mild-to-moderate chronic small
vessel ischemic disease. A focal collection of extra-axial fluid
medially in the right middle cranial fossa measures 3.6 x 1.6 x
cm and is consistent with an incidental arachnoid cyst with mild
mass effect on the temporal lobe. No focal cerebellar insult is
identified.

Vascular: Major intracranial vascular flow voids are preserved.

Skull and upper cervical spine: Unremarkable bone marrow signal.
Large left atlantooccipital joint effusion.

Sinuses/Orbits: Bilateral cataract extraction. Paranasal sinuses and
mastoid air cells are clear.

Other: None.
IMPRESSION: 1. No acute intracranial abnormality.
2. Mild-to-moderate chronic small vessel ischemic disease.
3. Incidental left middle cranial fossa arachnoid cyst.

## 2019-11-11 ENCOUNTER — Encounter (INDEPENDENT_AMBULATORY_CARE_PROVIDER_SITE_OTHER): Payer: Medicare Other | Admitting: Ophthalmology

## 2019-11-11 DIAGNOSIS — H33301 Unspecified retinal break, right eye: Secondary | ICD-10-CM

## 2019-12-27 DIAGNOSIS — D485 Neoplasm of uncertain behavior of skin: Secondary | ICD-10-CM | POA: Diagnosis not present

## 2019-12-27 DIAGNOSIS — D3612 Benign neoplasm of peripheral nerves and autonomic nervous system, upper limb, including shoulder: Secondary | ICD-10-CM | POA: Diagnosis not present

## 2019-12-27 DIAGNOSIS — L821 Other seborrheic keratosis: Secondary | ICD-10-CM | POA: Diagnosis not present

## 2019-12-27 DIAGNOSIS — L570 Actinic keratosis: Secondary | ICD-10-CM | POA: Diagnosis not present

## 2020-02-28 DIAGNOSIS — E1165 Type 2 diabetes mellitus with hyperglycemia: Secondary | ICD-10-CM | POA: Diagnosis not present

## 2020-02-28 DIAGNOSIS — I10 Essential (primary) hypertension: Secondary | ICD-10-CM | POA: Diagnosis not present

## 2020-02-28 DIAGNOSIS — Z130 Encounter for screening for diseases of the blood and blood-forming organs and certain disorders involving the immune mechanism: Secondary | ICD-10-CM | POA: Diagnosis not present

## 2020-02-28 DIAGNOSIS — E7800 Pure hypercholesterolemia, unspecified: Secondary | ICD-10-CM | POA: Diagnosis not present

## 2020-03-02 DIAGNOSIS — Z1389 Encounter for screening for other disorder: Secondary | ICD-10-CM | POA: Diagnosis not present

## 2020-03-02 DIAGNOSIS — R05 Cough: Secondary | ICD-10-CM | POA: Diagnosis not present

## 2020-03-02 DIAGNOSIS — E1165 Type 2 diabetes mellitus with hyperglycemia: Secondary | ICD-10-CM | POA: Diagnosis not present

## 2020-03-13 ENCOUNTER — Encounter (INDEPENDENT_AMBULATORY_CARE_PROVIDER_SITE_OTHER): Payer: Medicare Other | Admitting: Ophthalmology

## 2020-03-13 ENCOUNTER — Other Ambulatory Visit: Payer: Self-pay

## 2020-03-13 DIAGNOSIS — H35371 Puckering of macula, right eye: Secondary | ICD-10-CM

## 2020-03-13 DIAGNOSIS — I10 Essential (primary) hypertension: Secondary | ICD-10-CM | POA: Diagnosis not present

## 2020-03-13 DIAGNOSIS — H43813 Vitreous degeneration, bilateral: Secondary | ICD-10-CM

## 2020-03-13 DIAGNOSIS — H33301 Unspecified retinal break, right eye: Secondary | ICD-10-CM

## 2020-03-13 DIAGNOSIS — H35033 Hypertensive retinopathy, bilateral: Secondary | ICD-10-CM

## 2020-03-20 DIAGNOSIS — I10 Essential (primary) hypertension: Secondary | ICD-10-CM | POA: Diagnosis not present

## 2020-03-20 DIAGNOSIS — Z7984 Long term (current) use of oral hypoglycemic drugs: Secondary | ICD-10-CM | POA: Diagnosis not present

## 2020-03-20 DIAGNOSIS — E1165 Type 2 diabetes mellitus with hyperglycemia: Secondary | ICD-10-CM | POA: Diagnosis not present

## 2020-03-20 DIAGNOSIS — E039 Hypothyroidism, unspecified: Secondary | ICD-10-CM | POA: Diagnosis not present

## 2020-04-18 DIAGNOSIS — E1165 Type 2 diabetes mellitus with hyperglycemia: Secondary | ICD-10-CM | POA: Diagnosis not present

## 2020-04-19 DIAGNOSIS — I10 Essential (primary) hypertension: Secondary | ICD-10-CM | POA: Diagnosis not present

## 2020-04-19 DIAGNOSIS — E1165 Type 2 diabetes mellitus with hyperglycemia: Secondary | ICD-10-CM | POA: Diagnosis not present

## 2020-04-19 DIAGNOSIS — E039 Hypothyroidism, unspecified: Secondary | ICD-10-CM | POA: Diagnosis not present

## 2020-04-19 DIAGNOSIS — Z7984 Long term (current) use of oral hypoglycemic drugs: Secondary | ICD-10-CM | POA: Diagnosis not present

## 2020-05-09 ENCOUNTER — Encounter: Payer: Self-pay | Admitting: Cardiology

## 2020-05-09 ENCOUNTER — Ambulatory Visit: Payer: No Typology Code available for payment source | Admitting: Cardiology

## 2020-05-09 VITALS — BP 120/70 | HR 76 | Ht 65.0 in | Wt 245.6 lb

## 2020-05-09 DIAGNOSIS — E782 Mixed hyperlipidemia: Secondary | ICD-10-CM | POA: Diagnosis not present

## 2020-05-09 DIAGNOSIS — I447 Left bundle-branch block, unspecified: Secondary | ICD-10-CM

## 2020-05-09 DIAGNOSIS — I10 Essential (primary) hypertension: Secondary | ICD-10-CM | POA: Diagnosis not present

## 2020-05-09 DIAGNOSIS — I493 Ventricular premature depolarization: Secondary | ICD-10-CM

## 2020-05-09 NOTE — Patient Instructions (Addendum)

## 2020-05-09 NOTE — Progress Notes (Signed)
Cardiology Office Note  Date: 05/09/2020   ID: Lorrinda, Ramstad 03/30/1946, MRN [id number]  PCP:  Rory Percy, MD  Cardiologist:  Rozann Lesches, MD Electrophysiologist:  None   Chief Complaint  Patient presents with  . Cardiac follow-up    History of Present Illness: Grace Delacruz is a 74 y.o. female last assessed via telehealth encounter in November 2020.  She presents for a routine visit.  Overall, doing well, no progressive sense of palpitations, no chest pain or syncope.  She continues to work on diet and exercise with goal of further weight loss.  I reviewed her medications which are outlined below.  She is also now on Ozempic.  She has follow-up lab work planned later this year with PCP.  I personally reviewed her ECG today which shows sinus rhythm with left bundle branch block.  Past Medical History:  Diagnosis Date  . Anxiety   . Depression   . DJD (degenerative joint disease)   . Esophageal reflux   . Essential hypertension   . Fibrocystic breast disease   . Hypothyroidism   . Left bundle branch block    Transient - possibly rate related   . Menopause   . Osteoarthritis   . PONV (postoperative nausea and vomiting)    pt. would like to have a scopolamine patch  . Restless leg syndrome   . Scoliosis   . Type 2 diabetes mellitus ([place])     Past Surgical History:  Procedure Laterality Date  . BREAST BIOPSY     lt x3  . CARPOMETACARPEL SUSPENSION PLASTY Left 03/08/2014   Procedure: LEFT THUMB CARPOMETACARPEL ([place]) SUSPENSION PLASTY WITH TRAPEZIUM EXCISION;  Surgeon: [name]., MD;  Location: [facility];  Service: Orthopedics;  Laterality: Left;  . CATARACT EXTRACTION Bilateral   . CESAREAN SECTION    . CHOLECYSTECTOMY    . COLONOSCOPY  03/26/2007   RMR:  Diffuse changes of proctocolitis as described above diminutive rectal polyp, cold biopsy/removed, pedunculated polyp of the splenic flexure hot snare removed.  Segmental  biopsies the colon taken of the ileocecal valve ulcer biopsied normal terminal ileum  . COLONOSCOPY N/A 01/05/2014   Procedure: COLONOSCOPY;  Surgeon: Rogene Houston, MD;  [facility];  Service: Endoscopy;  Laterality: N/A;  830  . EYE SURGERY    . GASTROCNEMIUS RECESSION Right 08/07/2016   Procedure: GASTROCNEMIUS RECESSION;  Surgeon: Newt Minion, MD;  Location: [facility];  Service: Orthopedics;  Laterality: Right;  . Joint fusion-foot     x2 LEFT FOOT  . KNEE ARTHROPLASTY  2001   Right  . KNEE ARTHROPLASTY  2002   Left  . LEFT HEART CATH AND CORONARY ANGIOGRAPHY N/A 06/18/2018   Procedure: LEFT HEART CATH AND CORONARY ANGIOGRAPHY;  Surgeon: Burnell Blanks, MD;  Location: [facility];  Service: Cardiovascular;  Laterality: N/A;  . Left leg vein resection    . ROTATOR CUFF REPAIR     LEFT  . THYROIDECTOMY  1980   3/4  . TONSILLECTOMY    . TUBAL LIGATION    . WEIL OSTEOTOMY Right 08/07/2016   Procedure: WEIL OSTEOTOMY 2nd and 3rd Metatarsal Right Foot;  Surgeon: Newt Minion, MD;  Location: [facility];  Service: Orthopedics;  Laterality: Right;  . WEIL OSTEOTOMY Left 12/25/2016   Procedure: 2nd Toe Proximal Interphalangeal Resection, Weil Osteotomy 2nd and 3rd Metatarsal Left Foot;  Surgeon: Newt Minion, MD;  Location: [facility];  Service: Orthopedics;  Laterality: Left;    Current Outpatient Medications  Medication Sig Dispense Refill  . aspirin EC 81 MG tablet Take 81 mg by mouth at bedtime.     . docusate sodium (COLACE) 100 MG capsule Take 100 mg by mouth at bedtime.    Marland Kitchen FLUoxetine (PROZAC) 20 MG capsule Take 20 mg by mouth every morning.     . furosemide (LASIX) 40 MG tablet Take 40 mg by mouth daily.    Marland Kitchen ibuprofen (ADVIL,MOTRIN) 200 MG tablet Take 400 mg by mouth daily.     Javier Docker Oil 500 MG CAPS Take 500 mg by mouth daily.    Marland Kitchen levothyroxine (SYNTHROID, LEVOTHROID) 100 MCG tablet Take 100 mcg by mouth daily before breakfast.     . loratadine (CLARITIN) 10  MG tablet Take 10 mg by mouth daily.     Marland Kitchen losartan (COZAAR) 100 MG tablet Take 1 tablet (100 mg total) by mouth every evening.    . meclizine (ANTIVERT) 25 MG tablet Take 25 mg by mouth 3 (three) times daily as needed for dizziness.     . metFORMIN (GLUCOPHAGE-XR) 500 MG 24 hr tablet Take 500 mg by mouth 2 (two) times a day.     . metoprolol succinate (TOPROL-XL) 100 MG 24 hr tablet Take 100 mg by mouth every morning. Take with or immediately following a meal.    . omeprazole (PRILOSEC) 20 MG capsule Take 20 mg by mouth daily before breakfast.    . OZEMPIC, 0.25 OR 0.5 MG/DOSE, 2 MG/1.5ML SOPN Inject 1 mL into the skin once a week.    . simvastatin (ZOCOR) 40 MG tablet Take 40 mg by mouth at bedtime.    . triamcinolone cream (KENALOG) 0.5 % Apply 1 application topically 3 (three) times daily as needed. For rash/irritation.    . Vitamin D, Cholecalciferol, 1000 UNITS CAPS Take 1,000 mg by mouth 2 (two) times daily.     No current facility-administered medications for this visit.   Allergies:  Ephedrine, Hibiclens [chlorhexidine gluconate], and Keflex [cephalexin]   ROS:   Recurring vertigo symptoms, possibly BPV based on description.  She states that she has a consultation pending at [facility] with ENT specialist.  Physical Exam: VS:  BP 120/70   Pulse 76   Ht 5\' 5"  (1.651 m)   Wt 245 lb 9.6 oz (111.4 kg)   SpO2 94%   BMI 40.87 kg/m , BMI Body mass index is 40.87 kg/m.  Wt Readings from Last 3 Encounters:  05/09/20 245 lb 9.6 oz (111.4 kg)  08/30/19 248 lb (112.5 kg)  03/03/19 253 lb (114.8 kg)    General: Patient appears comfortable at rest. HEENT: Conjunctiva and lids normal, wearing a mask. Neck: Supple, no elevated JVP or carotid bruits, no thyromegaly. Lungs: Clear to auscultation, nonlabored breathing at rest. Cardiac: Regular rate and rhythm, no S3 or significant systolic murmur, no pericardial rub. Extremities: No pitting edema, distal pulses 2+.  ECG:  An ECG dated  06/18/2018 was personally reviewed today and demonstrated:  Sinus rhythm with intermittent left bundle branch block.  Recent Labwork:  October 2020: BUN 17, creatinine 0.98, potassium 4.0, AST 16, ALT 16, cholesterol 191, triglycerides 359, HDL 41, LDL 91, hemoglobin 14.2, platelets 240, hemoglobin A1c 6.1%  Other Studies Reviewed Today:  Cardiac catheterization 06/18/2018:  Mid RCA lesion is 20% stenosed.  Prox LAD to Mid LAD lesion is 30% stenosed.  Ost 1st Diag lesion is 20% stenosed.  1. Mild non-obstructive CAD  2. Mild elevation LVEDP  Recommendations: Medical management of CAD.  Echocardiogram7/31/2019: Study Conclusions  - Left ventricle: The cavity size was normal. Wall thickness was increased in a pattern of mild LVH. Systolic function was normal. The estimated ejection fraction was in the range of 55% to 60%. Wall motion was normal; there were no regional wall motion abnormalities. Doppler parameters are consistent with abnormal left ventricular relaxation (grade 1 diastolic dysfunction). - Aortic valve: Mildly calcified annulus. Trileaflet; mildly calcified leaflets. - Mitral valve: Mildly calcified annulus. - Left atrium: The atrium was at the upper limits of normal in size. - Right atrium: Central venous pressure (est): 3 mm Hg. - Atrial septum: No defect or patent foramen ovale was identified. - Tricuspid valve: There was trivial regurgitation. - Pulmonary arteries: PA peak pressure: 12 mm Hg (S). - Pericardium, extracardiac: There was no pericardial effusion.  Assessment and Plan:  1.  Frequent PVCs and history of NSVT, currently doing well without any progressive palpitations or history of syncope.  LVEF is normal and she has mild coronary atherosclerosis.  Continue Toprol-XL at current dose.  2.  Intermittent left bundle branch block pattern by ECG, chronic finding.  3.  Essential hypertension, blood pressure is well controlled today.   She continues on Toprol-XL and losartan as well as Lasix.  Keep follow-up with PCP.  4.  Mixed hyperlipidemia on Zocor.  Last LDL was 91 in October 2020.  Follow-up lab work later this year with PCP.  Could consider switching to Crestor to get LDL under 70 if necessary.  Medication Adjustments/Labs and Tests Ordered: Current medicines are reviewed at length with the patient today.  Concerns regarding medicines are outlined above.   Tests Ordered: Orders Placed This Encounter  Procedures  . EKG 12-Lead    Medication Changes: No orders of the defined types were placed in this encounter.   Disposition:  Follow up 6 months in the [place] office.  Signed, Satira Sark, MD, [facility] 05/09/2020 3:32 PM    [facility] [place], [place], [place] [id number] Phone: [phone]; Fax: [phone]

## 2020-05-14 DIAGNOSIS — Z20822 Contact with and (suspected) exposure to covid-19: Secondary | ICD-10-CM | POA: Diagnosis not present

## 2020-05-19 DIAGNOSIS — I10 Essential (primary) hypertension: Secondary | ICD-10-CM | POA: Diagnosis not present

## 2020-05-19 DIAGNOSIS — E1165 Type 2 diabetes mellitus with hyperglycemia: Secondary | ICD-10-CM | POA: Diagnosis not present

## 2020-05-19 DIAGNOSIS — Z7984 Long term (current) use of oral hypoglycemic drugs: Secondary | ICD-10-CM | POA: Diagnosis not present

## 2020-05-19 DIAGNOSIS — E039 Hypothyroidism, unspecified: Secondary | ICD-10-CM | POA: Diagnosis not present

## 2020-06-13 DIAGNOSIS — E1165 Type 2 diabetes mellitus with hyperglycemia: Secondary | ICD-10-CM | POA: Diagnosis not present

## 2020-06-14 ENCOUNTER — Encounter (INDEPENDENT_AMBULATORY_CARE_PROVIDER_SITE_OTHER): Payer: Medicare Other | Admitting: Ophthalmology

## 2020-06-14 ENCOUNTER — Other Ambulatory Visit: Payer: Self-pay

## 2020-06-14 DIAGNOSIS — H33301 Unspecified retinal break, right eye: Secondary | ICD-10-CM

## 2020-06-14 DIAGNOSIS — H43822 Vitreomacular adhesion, left eye: Secondary | ICD-10-CM | POA: Diagnosis not present

## 2020-06-14 DIAGNOSIS — I10 Essential (primary) hypertension: Secondary | ICD-10-CM | POA: Diagnosis not present

## 2020-06-14 DIAGNOSIS — H35033 Hypertensive retinopathy, bilateral: Secondary | ICD-10-CM | POA: Diagnosis not present

## 2020-06-14 DIAGNOSIS — H35371 Puckering of macula, right eye: Secondary | ICD-10-CM | POA: Diagnosis not present

## 2020-06-14 DIAGNOSIS — H353122 Nonexudative age-related macular degeneration, left eye, intermediate dry stage: Secondary | ICD-10-CM

## 2020-06-20 DIAGNOSIS — I10 Essential (primary) hypertension: Secondary | ICD-10-CM | POA: Diagnosis not present

## 2020-06-20 DIAGNOSIS — Z7984 Long term (current) use of oral hypoglycemic drugs: Secondary | ICD-10-CM | POA: Diagnosis not present

## 2020-06-20 DIAGNOSIS — E039 Hypothyroidism, unspecified: Secondary | ICD-10-CM | POA: Diagnosis not present

## 2020-06-20 DIAGNOSIS — E1165 Type 2 diabetes mellitus with hyperglycemia: Secondary | ICD-10-CM | POA: Diagnosis not present

## 2020-06-27 DIAGNOSIS — L570 Actinic keratosis: Secondary | ICD-10-CM | POA: Diagnosis not present

## 2020-07-11 DIAGNOSIS — R519 Headache, unspecified: Secondary | ICD-10-CM | POA: Diagnosis not present

## 2020-07-11 DIAGNOSIS — H8112 Benign paroxysmal vertigo, left ear: Secondary | ICD-10-CM | POA: Insufficient documentation

## 2020-07-11 DIAGNOSIS — R42 Dizziness and giddiness: Secondary | ICD-10-CM | POA: Diagnosis not present

## 2020-07-11 DIAGNOSIS — H9041 Sensorineural hearing loss, unilateral, right ear, with unrestricted hearing on the contralateral side: Secondary | ICD-10-CM | POA: Diagnosis not present

## 2020-07-20 DIAGNOSIS — E1165 Type 2 diabetes mellitus with hyperglycemia: Secondary | ICD-10-CM | POA: Diagnosis not present

## 2020-07-20 DIAGNOSIS — Z1231 Encounter for screening mammogram for malignant neoplasm of breast: Secondary | ICD-10-CM | POA: Diagnosis not present

## 2020-07-20 DIAGNOSIS — Z7984 Long term (current) use of oral hypoglycemic drugs: Secondary | ICD-10-CM | POA: Diagnosis not present

## 2020-07-20 DIAGNOSIS — I10 Essential (primary) hypertension: Secondary | ICD-10-CM | POA: Diagnosis not present

## 2020-08-03 DIAGNOSIS — Z23 Encounter for immunization: Secondary | ICD-10-CM | POA: Diagnosis not present

## 2020-08-18 DIAGNOSIS — E1165 Type 2 diabetes mellitus with hyperglycemia: Secondary | ICD-10-CM | POA: Diagnosis not present

## 2020-08-18 DIAGNOSIS — D519 Vitamin B12 deficiency anemia, unspecified: Secondary | ICD-10-CM | POA: Diagnosis not present

## 2020-08-18 DIAGNOSIS — I10 Essential (primary) hypertension: Secondary | ICD-10-CM | POA: Diagnosis not present

## 2020-08-18 DIAGNOSIS — E7800 Pure hypercholesterolemia, unspecified: Secondary | ICD-10-CM | POA: Diagnosis not present

## 2020-08-18 DIAGNOSIS — E559 Vitamin D deficiency, unspecified: Secondary | ICD-10-CM | POA: Diagnosis not present

## 2020-08-18 DIAGNOSIS — E039 Hypothyroidism, unspecified: Secondary | ICD-10-CM | POA: Diagnosis not present

## 2020-08-19 DIAGNOSIS — E1165 Type 2 diabetes mellitus with hyperglycemia: Secondary | ICD-10-CM | POA: Diagnosis not present

## 2020-08-19 DIAGNOSIS — I10 Essential (primary) hypertension: Secondary | ICD-10-CM | POA: Diagnosis not present

## 2020-08-19 DIAGNOSIS — E039 Hypothyroidism, unspecified: Secondary | ICD-10-CM | POA: Diagnosis not present

## 2020-08-19 DIAGNOSIS — Z7984 Long term (current) use of oral hypoglycemic drugs: Secondary | ICD-10-CM | POA: Diagnosis not present

## 2020-08-23 DIAGNOSIS — Z0001 Encounter for general adult medical examination with abnormal findings: Secondary | ICD-10-CM | POA: Diagnosis not present

## 2020-08-23 DIAGNOSIS — E039 Hypothyroidism, unspecified: Secondary | ICD-10-CM | POA: Diagnosis not present

## 2020-09-19 DIAGNOSIS — Z7984 Long term (current) use of oral hypoglycemic drugs: Secondary | ICD-10-CM | POA: Diagnosis not present

## 2020-09-19 DIAGNOSIS — E1165 Type 2 diabetes mellitus with hyperglycemia: Secondary | ICD-10-CM | POA: Diagnosis not present

## 2020-09-19 DIAGNOSIS — I10 Essential (primary) hypertension: Secondary | ICD-10-CM | POA: Diagnosis not present

## 2020-10-09 DIAGNOSIS — H524 Presbyopia: Secondary | ICD-10-CM | POA: Diagnosis not present

## 2020-10-20 DIAGNOSIS — I10 Essential (primary) hypertension: Secondary | ICD-10-CM | POA: Diagnosis not present

## 2020-10-20 DIAGNOSIS — E1165 Type 2 diabetes mellitus with hyperglycemia: Secondary | ICD-10-CM | POA: Diagnosis not present

## 2020-10-20 DIAGNOSIS — Z7984 Long term (current) use of oral hypoglycemic drugs: Secondary | ICD-10-CM | POA: Diagnosis not present

## 2020-10-31 DIAGNOSIS — Z20822 Contact with and (suspected) exposure to covid-19: Secondary | ICD-10-CM | POA: Diagnosis not present

## 2020-10-31 DIAGNOSIS — J069 Acute upper respiratory infection, unspecified: Secondary | ICD-10-CM | POA: Diagnosis not present

## 2020-11-15 ENCOUNTER — Encounter (INDEPENDENT_AMBULATORY_CARE_PROVIDER_SITE_OTHER): Payer: Medicare Other | Admitting: Ophthalmology

## 2020-11-15 ENCOUNTER — Other Ambulatory Visit: Payer: Self-pay

## 2020-11-15 DIAGNOSIS — H43813 Vitreous degeneration, bilateral: Secondary | ICD-10-CM | POA: Diagnosis not present

## 2020-11-15 DIAGNOSIS — H35373 Puckering of macula, bilateral: Secondary | ICD-10-CM | POA: Diagnosis not present

## 2020-11-15 DIAGNOSIS — H353122 Nonexudative age-related macular degeneration, left eye, intermediate dry stage: Secondary | ICD-10-CM

## 2020-11-15 DIAGNOSIS — H35033 Hypertensive retinopathy, bilateral: Secondary | ICD-10-CM

## 2020-11-15 DIAGNOSIS — I10 Essential (primary) hypertension: Secondary | ICD-10-CM

## 2021-03-05 NOTE — Progress Notes (Signed)
Cardiology Office Note  Date: 03/06/2021   ID: Consepcion, Utt 11-10-1945, MRN [id number]  PCP:  Sedalia Nation, MD  Cardiologist:  Rozann Lesches, MD Electrophysiologist:  None   Chief Complaint  Patient presents with  . Cardiac follow-up    History of Present Illness: Grace Delacruz is a 75 y.o. female last seen in July 2021.  She presents for a routine visit.  Doing well, she has lost around 15 pounds since last year through combination of diet and her current medications including Ozempic.  Reports hemoglobin A1c now under 6%.  We are requesting her recent lab work from Dr. Jimmye Norman.  She does not report any exertional chest pain, no palpitations or syncope.  I reviewed her current medications as detailed below.  She does not describe any intolerances.  Past Medical History:  Diagnosis Date  . Anxiety   . Depression   . DJD (degenerative joint disease)   . Esophageal reflux   . Essential hypertension   . Fibrocystic breast disease   . Hypothyroidism   . Left bundle branch block    Transient - possibly rate related   . Menopause   . Osteoarthritis   . PONV (postoperative nausea and vomiting)    pt. would like to have a scopolamine patch  . Restless leg syndrome   . Scoliosis   . Type 2 diabetes mellitus ([place])     Past Surgical History:  Procedure Laterality Date  . BREAST BIOPSY     lt x3  . CARPOMETACARPEL SUSPENSION PLASTY Left 03/08/2014   Procedure: LEFT THUMB CARPOMETACARPEL ([place]) SUSPENSION PLASTY WITH TRAPEZIUM EXCISION;  Surgeon: [name]., MD;  Location: [place];  Service: Orthopedics;  Laterality: Left;  . CATARACT EXTRACTION Bilateral   . CESAREAN SECTION    . CHOLECYSTECTOMY    . COLONOSCOPY  03/26/2007   RMR:  Diffuse changes of proctocolitis as described above diminutive rectal polyp, cold biopsy/removed, pedunculated polyp of the splenic flexure hot snare removed.  Segmental biopsies the colon taken of the  ileocecal valve ulcer biopsied normal terminal ileum  . COLONOSCOPY N/A 01/05/2014   Procedure: COLONOSCOPY;  Surgeon: Rogene Houston, MD;  [facility];  Service: Endoscopy;  Laterality: N/A;  830  . EYE SURGERY    . GASTROCNEMIUS RECESSION Right 08/07/2016   Procedure: GASTROCNEMIUS RECESSION;  Surgeon: Newt Minion, MD;  Location: [place];  Service: Orthopedics;  Laterality: Right;  . Joint fusion-foot     x2 LEFT FOOT  . KNEE ARTHROPLASTY  2001   Right  . KNEE ARTHROPLASTY  2002   Left  . LEFT HEART CATH AND CORONARY ANGIOGRAPHY N/A 06/18/2018   Procedure: LEFT HEART CATH AND CORONARY ANGIOGRAPHY;  Surgeon: Burnell Blanks, MD;  Location: [place];  Service: Cardiovascular;  Laterality: N/A;  . Left leg vein resection    . ROTATOR CUFF REPAIR     LEFT  . THYROIDECTOMY  1980   3/4  . TONSILLECTOMY    . TUBAL LIGATION    . WEIL OSTEOTOMY Right 08/07/2016   Procedure: WEIL OSTEOTOMY 2nd and 3rd Metatarsal Right Foot;  Surgeon: Newt Minion, MD;  Location: [place];  Service: Orthopedics;  Laterality: Right;  . WEIL OSTEOTOMY Left 12/25/2016   Procedure: 2nd Toe Proximal Interphalangeal Resection, Weil Osteotomy 2nd and 3rd Metatarsal Left Foot;  Surgeon: Newt Minion, MD;  Location: [place] [facility];  Service: Orthopedics;  Laterality: Left;  Current Outpatient Medications  Medication Sig Dispense Refill  . aspirin EC 81 MG tablet Take 81 mg by mouth at bedtime.     . docusate sodium (COLACE) 100 MG capsule Take 100 mg by mouth at bedtime.    Marland Kitchen FLUoxetine (PROZAC) 20 MG capsule Take 20 mg by mouth every morning.     . furosemide (LASIX) 40 MG tablet Take 20 mg by mouth daily.    Marland Kitchen ibuprofen (ADVIL,MOTRIN) 200 MG tablet Take 400 mg by mouth daily.     Javier Docker Oil 500 MG CAPS Take 500 mg by mouth daily.    Marland Kitchen levothyroxine (SYNTHROID, LEVOTHROID) 100 MCG tablet Take 100 mcg by mouth daily before breakfast.     . loratadine (CLARITIN) 10 MG tablet Take 10 mg by mouth  daily.    Marland Kitchen losartan (COZAAR) 100 MG tablet Take 1 tablet (100 mg total) by mouth every evening.    . meclizine (ANTIVERT) 25 MG tablet Take 25 mg by mouth 3 (three) times daily as needed for dizziness.     . metFORMIN (GLUCOPHAGE-XR) 500 MG 24 hr tablet Take 500 mg by mouth 2 (two) times a day.     . metoprolol succinate (TOPROL-XL) 100 MG 24 hr tablet Take 100 mg by mouth every morning. Take with or immediately following a meal.    . Multiple Vitamin (MULTIVITAMIN) tablet Take 1 tablet by mouth daily.    Marland Kitchen omeprazole (PRILOSEC) 20 MG capsule Take 20 mg by mouth daily before breakfast.    . OZEMPIC, 0.25 OR 0.5 MG/DOSE, 2 MG/1.5ML SOPN Inject 1 mL into the skin once a week.    . simvastatin (ZOCOR) 40 MG tablet Take 40 mg by mouth at bedtime.    . triamcinolone cream (KENALOG) 0.5 % Apply 1 application topically 3 (three) times daily as needed. For rash/irritation.    . vitamin B-12 (CYANOCOBALAMIN) 500 MCG tablet Take 500 mcg by mouth daily.    . vitamin C (ASCORBIC ACID) 500 MG tablet Take 500 mg by mouth daily.    . Vitamin D, Cholecalciferol, 1000 UNITS CAPS Take 1,000 mg by mouth 2 (two) times daily.     No current facility-administered medications for this visit.   Allergies:  Ephedrine, Hibiclens [chlorhexidine gluconate], and Keflex [cephalexin]   ROS: No orthopnea or PND.  She has had a few falls in the last 6 months, either missed a step or tripped, no syncope.  Physical Exam: VS:  BP 124/72   Pulse 69   Ht 5\' 5"  (1.651 m)   Wt 231 lb (104.8 kg)   SpO2 98%   BMI 38.44 kg/m , BMI Body mass index is 38.44 kg/m.  Wt Readings from Last 3 Encounters:  03/06/21 231 lb (104.8 kg)  05/09/20 245 lb 9.6 oz (111.4 kg)  08/30/19 248 lb (112.5 kg)    General: Patient appears comfortable at rest. HEENT: Conjunctiva and lids normal, wearing a mask. Neck: Supple, no elevated JVP or carotid bruits, no thyromegaly. Lungs: Clear to auscultation, nonlabored breathing at rest. Cardiac:  Regular rate and rhythm, no S3 or significant systolic murmur, no pericardial rub. Extremities: No pitting edema.  ECG:  An ECG dated 05/09/2020 was personally reviewed today and demonstrated:  Sinus rhythm with left bundle branch block.  Recent Labwork:  October 2020: BUN 17, creatinine 0.98, potassium 4.0, AST 16, ALT 16, cholesterol 191, triglycerides 359, HDL 41, LDL 91, hemoglobin 14.2, platelets 240, hemoglobin A1c 6.1%  Other Studies Reviewed Today:  Cardiac  catheterization 06/18/2018:  Mid RCA lesion is 20% stenosed.  Prox LAD to Mid LAD lesion is 30% stenosed.  Ost 1st Diag lesion is 20% stenosed.  1. Mild non-obstructive CAD 2. Mild elevation LVEDP  Recommendations: Medical management of CAD.  Echocardiogram7/31/2019: Study Conclusions  - Left ventricle: The cavity size was normal. Wall thickness was increased in a pattern of mild LVH. Systolic function was normal. The estimated ejection fraction was in the range of 55% to 60%. Wall motion was normal; there were no regional wall motion abnormalities. Doppler parameters are consistent with abnormal left ventricular relaxation (grade 1 diastolic dysfunction). - Aortic valve: Mildly calcified annulus. Trileaflet; mildly calcified leaflets. - Mitral valve: Mildly calcified annulus. - Left atrium: The atrium was at the upper limits of normal in size. - Right atrium: Central venous pressure (est): 3 mm Hg. - Atrial septum: No defect or patent foramen ovale was identified. - Tricuspid valve: There was trivial regurgitation. - Pulmonary arteries: PA peak pressure: 12 mm Hg (S). - Pericardium, extracardiac: There was no pericardial effusion.  Assessment and Plan:  1.  History of frequent PVCs and NSVT, continues to do well on Toprol-XL, no dizziness or syncope.  LVEF normal and only mild CAD by cardiac catheterization in 2019.  2.  Mixed hyperlipidemia, tolerating Zocor.  Requesting interval lab work  from [facility].  3.  Intermittent left bundle branch block by ECG.  4.  Type 2 diabetes mellitus, coming under good control per discussion today.  She is working on diet and further weight loss, also tolerating Ozempic and Glucophage XR.  Continues to follow with Dr. Jimmye Norman.  Medication Adjustments/Labs and Tests Ordered: Current medicines are reviewed at length with the patient today.  Concerns regarding medicines are outlined above.   Tests Ordered: No orders of the defined types were placed in this encounter.   Medication Changes: No orders of the defined types were placed in this encounter.   Disposition:  Follow up 1 year.  Signed, Satira Sark, MD, [facility] 03/06/2021 10:08 AM    [facility] [address], [place], [place] [address] Phone: [phone]; Fax: [phone]

## 2021-03-06 ENCOUNTER — Encounter: Payer: Self-pay | Admitting: Cardiology

## 2021-03-06 ENCOUNTER — Ambulatory Visit: Payer: Medicare Other | Admitting: Cardiology

## 2021-03-06 ENCOUNTER — Encounter: Payer: Self-pay | Admitting: *Deleted

## 2021-03-06 VITALS — BP 124/72 | HR 69 | Ht 65.0 in | Wt 231.0 lb

## 2021-03-06 DIAGNOSIS — E119 Type 2 diabetes mellitus without complications: Secondary | ICD-10-CM | POA: Diagnosis not present

## 2021-03-06 DIAGNOSIS — I447 Left bundle-branch block, unspecified: Secondary | ICD-10-CM

## 2021-03-06 DIAGNOSIS — I493 Ventricular premature depolarization: Secondary | ICD-10-CM | POA: Diagnosis not present

## 2021-03-06 NOTE — Patient Instructions (Addendum)
Medication Instructions:   Your physician has recommended you make the following change in your medication:   Decrease furosemide 20 mg daily  Continue other medications the same  Labwork:  none  Testing/Procedures:  none  Follow-Up:  Your physician recommends that you schedule a follow-up appointment in: 1 year. You will receive a reminder call or letter in the mail in about 10 months reminding you to call and schedule your appointment. If you don't receive this letter, please contact our office.   Any Other Special Instructions Will Be Listed Below (If Applicable).  If you need a refill on your cardiac medications before your next appointment, please call your pharmacy.

## 2021-05-10 ENCOUNTER — Ambulatory Visit: Payer: Medicare Other | Admitting: Neurology

## 2021-05-10 ENCOUNTER — Encounter: Payer: Self-pay | Admitting: Neurology

## 2021-05-10 ENCOUNTER — Telehealth: Payer: Self-pay | Admitting: Neurology

## 2021-05-10 VITALS — BP 152/84 | HR 71 | Ht 65.0 in | Wt 235.8 lb

## 2021-05-10 DIAGNOSIS — H81399 Other peripheral vertigo, unspecified ear: Secondary | ICD-10-CM

## 2021-05-10 DIAGNOSIS — M542 Cervicalgia: Secondary | ICD-10-CM

## 2021-05-10 DIAGNOSIS — R200 Anesthesia of skin: Secondary | ICD-10-CM | POA: Diagnosis not present

## 2021-05-10 DIAGNOSIS — H9011 Conductive hearing loss, unilateral, right ear, with unrestricted hearing on the contralateral side: Secondary | ICD-10-CM

## 2021-05-10 DIAGNOSIS — G4486 Cervicogenic headache: Secondary | ICD-10-CM | POA: Diagnosis not present

## 2021-05-10 DIAGNOSIS — G629 Polyneuropathy, unspecified: Secondary | ICD-10-CM | POA: Diagnosis not present

## 2021-05-10 DIAGNOSIS — R269 Unspecified abnormalities of gait and mobility: Secondary | ICD-10-CM

## 2021-05-10 NOTE — Telephone Encounter (Signed)
MRI cervical spine wo contrast & brain/IACs w/wo contrast [employer] auth: NPR  Sent to GI for scheduling

## 2021-05-10 NOTE — Progress Notes (Signed)
[facility]  PATIENT: Grace Delacruz DOB: 02-Mar-1946  REFERRING DOCTOR OR PCP: Stana Bunting, Rory Percy SOURCE: Patient, notes from primary care, imaging and laboratory reports, MRI images personally reviewed.  _________________________________   HISTORICAL  CHIEF COMPLAINT:  Chief Complaint  Patient presents with   New Patient (Initial Visit)    New rm, alone. Pt referred by Dr. Jimmye Norman, from [place], for intermittent vertigo. Pt c/o of dizziness and HA. Pt's states her vertigo has gradually worsened over the years. HA are mainly left occipital and frontal. Ongoing since last year, occurring weekly or biweekly. Takes ibuprofen for HA and meclizine for dizziness.     HISTORY OF PRESENT ILLNESS:  I had the pleasure of seeing your patient, Grace Delacruz, at [facility] for neurologic consultation regarding her vertigo, headaches and gait disturbance.  She is a 75 year old woman who has had intermittent vertigo x 10 years but notes more lightheadedness over the past few months.   If she turns her head when she gets out of bed, she feels will black out.    She feels her balance has gradually worsened.    She fell twice the last 2 months, once going down steps and once tripping over a small step going into a restaurant.    Onfall she broke her humerus.    She denies weakness in her legs.    She needs to hold the bannister on stairs (has x many years).     The vertigo spells she has had x 10 years are associated with nausea and vomiting and occur in clusters of several every few months.   She denies a stuffed up sensation in ears or hearing loss.    She saw ENT and was told hearing was fine.    She had VNG at [facility] c/w benign paroxysmal positional vertigo.    Epley maneuvers have helped.     She has had headaches mostly occipital and sometimes frontal.   These occur once a week and last hours to the rest of the day.   She denies nausea  or vomiting.   When one occurs, she takes ibuprofen.     She has urinary frequency and nocturia x 3.    Vascular risks:  She has well controlled essential hypertension.   Never smoked.   She has Type 2 NIDDM x 10 years.    Images personally reviewed.   MRI brain 06/02/2021 showed age-appropriate mild generalized cortical atrophy, scattered T2/FLAIR hyperintense foci consistent with mild chronic microvascular ischemic change and an arachnoid cyst in the left middle fossa.  There were no acute findings.  Vertebral arteries are diminutive and the left terminates as the left posterior inferior cerebellar artery.  Posterior communicating arteries are present.   REVIEW OF SYSTEMS: Constitutional: No fevers, chills, sweats, or change in appetite Eyes: No visual changes, double vision, eye pain.  She has dry eyes. Ear, nose and throat: No hearing loss, ear pain, nasal congestion, sore throat.  She had vertigo. Cardiovascular: No chest pain, palpitations Respiratory:  No shortness of breath at rest or with exertion.   No wheezes GastrointestinaI: No nausea, vomiting, diarrhea, abdominal pain, fecal incontinence Genitourinary:  No dysuria, urinary retention or frequency.  No nocturia. Musculoskeletal:  No neck pain, back pain Integumentary: No rash, pruritus, skin lesions Neurological: as above Psychiatric: No depression at this time.  No anxiety Endocrine: No palpitations, diaphoresis, change in appetite, change in weigh or increased thirst Hematologic/Lymphatic:  No anemia, purpura, petechiae. Allergic/Immunologic: No itchy/runny eyes, nasal congestion, recent allergic reactions, rashes  ALLERGIES: Allergies  Allergen Reactions   Ephedrine Palpitations    Heart rate fast-   Hibiclens [Chlorhexidine Gluconate] Rash    SKIN REDNESS AND BURNING SENSATION    NO CHG   Keflex [Cephalexin] Itching and Rash    HOME MEDICATIONS:  Current Outpatient Medications:    aspirin EC 81 MG tablet, Take  81 mg by mouth at bedtime. , Disp: , Rfl:    docusate sodium (COLACE) 100 MG capsule, Take 100 mg by mouth at bedtime., Disp: , Rfl:    FLUoxetine (PROZAC) 20 MG capsule, Take 20 mg by mouth every morning. , Disp: , Rfl:    furosemide (LASIX) 40 MG tablet, Take 20 mg by mouth daily., Disp: , Rfl:    ibuprofen (ADVIL,MOTRIN) 200 MG tablet, Take 400 mg by mouth daily. , Disp: , Rfl:    Krill Oil 500 MG CAPS, Take 500 mg by mouth daily., Disp: , Rfl:    levothyroxine (SYNTHROID, LEVOTHROID) 100 MCG tablet, Take 100 mcg by mouth daily before breakfast. , Disp: , Rfl:    loratadine (CLARITIN) 10 MG tablet, Take 10 mg by mouth daily., Disp: , Rfl:    losartan (COZAAR) 100 MG tablet, Take 1 tablet (100 mg total) by mouth every evening., Disp: , Rfl:    meclizine (ANTIVERT) 25 MG tablet, Take 25 mg by mouth 3 (three) times daily as needed for dizziness. , Disp: , Rfl:    metFORMIN (GLUCOPHAGE-XR) 500 MG 24 hr tablet, Take 500 mg by mouth 2 (two) times a day. , Disp: , Rfl:    metoprolol succinate (TOPROL-XL) 100 MG 24 hr tablet, Take 100 mg by mouth every morning. Take with or immediately following a meal., Disp: , Rfl:    Multiple Vitamin (MULTIVITAMIN) tablet, Take 1 tablet by mouth daily., Disp: , Rfl:    omeprazole (PRILOSEC) 20 MG capsule, Take 20 mg by mouth daily before breakfast., Disp: , Rfl:    OZEMPIC, 0.25 OR 0.5 MG/DOSE, 2 MG/1.5ML SOPN, Inject 1 mL into the skin once a week., Disp: , Rfl:    simvastatin (ZOCOR) 40 MG tablet, Take 40 mg by mouth at bedtime., Disp: , Rfl:    triamcinolone cream (KENALOG) 0.5 %, Apply 1 application topically 3 (three) times daily as needed. For rash/irritation., Disp: , Rfl:    vitamin C (ASCORBIC ACID) 500 MG tablet, Take 500 mg by mouth daily., Disp: , Rfl:    Vitamin D, Cholecalciferol, 1000 UNITS CAPS, Take 1,000 mg by mouth 2 (two) times daily., Disp: , Rfl:   PAST MEDICAL HISTORY: Past Medical History:  Diagnosis Date   Anxiety    Depression    DJD  (degenerative joint disease)    Esophageal reflux    Essential hypertension    Fibrocystic breast disease    Hypothyroidism    Left bundle branch block    Transient - possibly rate related    Menopause    Osteoarthritis    PONV (postoperative nausea and vomiting)    pt. would like to have a scopolamine patch   Restless leg syndrome    Scoliosis    Type 2 diabetes mellitus ([place])     PAST SURGICAL HISTORY: Past Surgical History:  Procedure Laterality Date   BREAST BIOPSY     lt x3   CARPOMETACARPEL SUSPENSION PLASTY Left 03/08/2014   Procedure: LEFT THUMB CARPOMETACARPEL ([place]) SUSPENSION PLASTY WITH TRAPEZIUM EXCISION;  Surgeon:  [name]., MD;  Location: [place];  Service: Orthopedics;  Laterality: Left;   CATARACT EXTRACTION Bilateral    CESAREAN SECTION     CHOLECYSTECTOMY     COLONOSCOPY  03/26/2007   RMR:  Diffuse changes of proctocolitis as described above diminutive rectal polyp, cold biopsy/removed, pedunculated polyp of the splenic flexure hot snare removed.  Segmental biopsies the colon taken of the ileocecal valve ulcer biopsied normal terminal ileum   COLONOSCOPY N/A 01/05/2014   Procedure: COLONOSCOPY;  Surgeon: Rogene Houston, MD;  [facility];  Service: Endoscopy;  Laterality: N/A;  830   EYE SURGERY     GASTROCNEMIUS RECESSION Right 08/07/2016   Procedure: GASTROCNEMIUS RECESSION;  Surgeon: Newt Minion, MD;  Location: [place];  Service: Orthopedics;  Laterality: Right;   Joint fusion-foot     x2 LEFT FOOT   KNEE ARTHROPLASTY  2001   Right   KNEE ARTHROPLASTY  2002   Left   LEFT HEART CATH AND CORONARY ANGIOGRAPHY N/A 06/18/2018   Procedure: LEFT HEART CATH AND CORONARY ANGIOGRAPHY;  Surgeon: Burnell Blanks, MD;  Location: [place] [facility];  Service: Cardiovascular;  Laterality: N/A;   Left leg vein resection     ROTATOR CUFF REPAIR     LEFT   THYROIDECTOMY  1980   3/4   TONSILLECTOMY     TUBAL LIGATION      WEIL OSTEOTOMY Right 08/07/2016   Procedure: WEIL OSTEOTOMY 2nd and 3rd Metatarsal Right Foot;  Surgeon: Newt Minion, MD;  Location: [place];  Service: Orthopedics;  Laterality: Right;   WEIL OSTEOTOMY Left 12/25/2016   Procedure: 2nd Toe Proximal Interphalangeal Resection, Weil Osteotomy 2nd and 3rd Metatarsal Left Foot;  Surgeon: Newt Minion, MD;  Location: [place];  Service: Orthopedics;  Laterality: Left;    FAMILY HISTORY: Family History  Problem Relation Age of Onset   Heart failure Father        Died with pneumonia   Hypertension Father    Arthritis Father    Diabetes Father    Hypertension Mother    Asthma Paternal Grandfather    Emphysema Paternal Grandfather     SOCIAL HISTORY:  Social History   Socioeconomic History   Marital status: Married    Spouse name: Not on file   Number of children: 1   Years of education: Not on file   Highest education level: Not on file  Occupational History   Occupation: retired  Tobacco Use   Smoking status: Never   Smokeless tobacco: Never  Substance and Sexual Activity   Alcohol use: Yes    Alcohol/week: 0.0 standard drinks    Comment: rare-- glass of wine at christmas   Drug use: No   Sexual activity: Not on file  Other Topics Concern   Not on file  Social History Narrative   Not on file   Social Determinants of Health   Financial Resource Strain: Not on file  Food Insecurity: Not on file  Transportation Needs: Not on file  Physical Activity: Not on file  Stress: Not on file  Social Connections: Not on file  Intimate Partner Violence: Not on file     PHYSICAL EXAM  Vitals:   05/10/21 1025  Weight: 235 lb 12.8 oz (107 kg)  Height: _0  (1.651 m)    Body mass index is 39.24 kg/m.   General: The patient is well-developed and well-nourished and in no acute distress  HEENT:  Head is [place]/AT.  Sclera are anicteric.     Neck: No carotid bruits are noted.  The neck is tender Over the occiput left >  right  Cardiovascular: The heart has a regular rate and rhythm with a normal S1 and S2. There were no murmurs, gallops or rubs.    Skin: Extremities are without rash or  edema.   Varicose veins  Musculoskeletal:  Back is nontender  Neurologic Exam  Mental status: The patient is alert and oriented x 3 at the time of the examination. The patient has apparent normal recent and remote memory, with an apparently normal attention span and concentration ability.   Speech is normal.  Cranial nerves: Extraocular movements are full. Pupils are equal, round, and reactive to light and accomodation.  Visual fields are full.  Facial symmetry is present. There is good facial sensation to soft touch bilaterally.Facial strength is normal.  Trapezius and sternocleidomastoid strength is normal. No dysarthria is noted.    Mild reduced right hearing and Weber lateralizes right.     Motor:  Muscle bulk is normal.   Tone is normal. Strength is  5 / 5 in all 4 extremities.   Sensory: Sensory testing is intact to pinprick, soft touch and vibration sensation in arms but reduced vibration at ankles (75%) and toes (20%)   Reduced touch/pp at toes to ankles.  Coordination: Cerebellar testing reveals good finger-nose-finger and heel-to-shin bilaterally.  Gait and station: Station is normal.   Gait is normal. Tandem gait is wide . Romberg is negative.   Reflexes: Deep tendon reflexes are symmetric and normal in arms, absent in legs.   Plantar responses are flexor.    DIAGNOSTIC DATA (LABS, IMAGING, TESTING) - I reviewed patient records, labs, notes, testing and imaging myself where available.  Lab Results  Component Value Date   WBC 7.2 06/12/2018   HGB 14.4 06/12/2018   HCT 42.6 06/12/2018   MCV 90.1 06/12/2018   PLT 223 06/12/2018      Component Value Date/Time   NA 142 06/12/2018 1151   K 3.9 06/12/2018 1151   CL 105 06/12/2018 1151   CO2 29 06/12/2018 1151   GLUCOSE 157 (H) 06/12/2018 1151   BUN 17  06/12/2018 1151   CREATININE 1.17 (H) 06/12/2018 1151   CALCIUM 9.7 06/12/2018 1151   GFRNONAA 46 (L) 06/12/2018 1151   GFRAA 53 (L) 06/12/2018 1151   No results found for: CHOL, HDL, LDLCALC, LDLDIRECT, TRIG, CHOLHDL Lab Results  Component Value Date   HGBA1C 6.6 (H) 12/20/2016        ASSESSMENT AND PLAN   Polyneuropathy  Gait disturbance - Plan: Sjogren's syndrome antibods(ssa + ssb), Multiple Myeloma Panel (SPEP&IFE w/QIG), Vitamin B12, Rheumatoid factor  Numbness - Plan: Sjogren's syndrome antibods(ssa + ssb), Multiple Myeloma Panel (SPEP&IFE w/QIG), Vitamin B12, Rheumatoid factor  Neck pain - Plan: Rheumatoid factor  Other peripheral vertigo, unspecified ear - Plan: MR BRAIN/IAC W WO CONTRAST  Cervicalgia - Plan: MR CERVICAL SPINE WO CONTRAST  Conductive hearing loss of right ear with unrestricted hearing of left ear  Cervicogenic headache  In summary, Ms. Viveros is a 75 year old woman with numbness, gait disturbance, vertigo and neck pain/headache.  The headaches have a component of occipital neuralgia and she was tender over that nerve on the left.  I will do a left splenius capitis trigger point injection with 30 mg Depo-Medrol and 3 cc lidocaine in the hope that that reduces her neck pain and headaches.  She has  vertigo and also has mild reduced hearing on the right.  We need to check an MRI of the brain with thin cuts through the internal auditory canals to make sure that there is not a acoustic schwannoma.  On examination, she had significant numbness in her feet.  This is most likely due to diabetic polyneuropathy but we will check some lab work for autoimmune and metabolic etiology.  She will return to see [place] in 3 months or sooner if there are new or worsening neurologic symptoms.  We will call her with the results of the labs and imaging studies.  Thank you for asking me to see Ms. Brittingham.  Please let me know if I can be of further assistance with her or other  patients in the future.  Katrisha Segall A. Felecia Shelling, MD, [facility] 6/74/2552, 58:94 AM Certified in Neurology, Clinical Neurophysiology, Sleep Medicine and Neuroimaging  [facility] [address], [place], [place] [address] [phone]

## 2021-05-15 LAB — SJOGREN'S SYNDROME ANTIBODS(SSA + SSB)
ENA SSA (RO) Ab: 0.2 AI (ref 0.0–0.9)
ENA SSB (LA) Ab: <0.2 AI (ref 0.0–0.9)

## 2021-05-15 LAB — MULTIPLE MYELOMA PANEL, SERUM
Albumin SerPl Elph-Mcnc: 3.7 g/dL (ref 2.9–4.4)
Albumin/Glob SerPl: 1.3 (ref 0.7–1.7)
Alpha 1: 0.2 g/dL (ref 0.0–0.4)
Alpha2 Glob SerPl Elph-Mcnc: 0.7 g/dL (ref 0.4–1.0)
B-Globulin SerPl Elph-Mcnc: 1.1 g/dL (ref 0.7–1.3)
Gamma Glob SerPl Elph-Mcnc: 0.9 g/dL (ref 0.4–1.8)
Globulin, Total: 2.9 g/dL (ref 2.2–3.9)
IgA/Immunoglobulin A, Serum: 217 mg/dL (ref 64–422)
IgG (Immunoglobin G), Serum: 1011 mg/dL (ref 586–1602)
IgM (Immunoglobulin M), Srm: 41 mg/dL (ref 26–217)
Total Protein: 6.6 g/dL (ref 6.0–8.5)

## 2021-05-15 LAB — VITAMIN B12: Vitamin B-12: 679 pg/mL (ref 232–1245)

## 2021-05-15 LAB — RHEUMATOID FACTOR: Rheumatoid fact SerPl-aCnc: <10 IU/mL (ref <14.0)

## 2021-05-17 ENCOUNTER — Encounter (INDEPENDENT_AMBULATORY_CARE_PROVIDER_SITE_OTHER): Payer: Medicare Other | Admitting: Ophthalmology

## 2021-05-23 ENCOUNTER — Ambulatory Visit
Admission: RE | Admit: 2021-05-23 | Discharge: 2021-05-23 | Disposition: A | Discharge status: Home / Self Care | Payer: Medicare Other | Source: Ambulatory Visit | Attending: Neurology | Admitting: Neurology

## 2021-05-23 DIAGNOSIS — H81399 Other peripheral vertigo, unspecified ear: Secondary | ICD-10-CM

## 2021-05-23 DIAGNOSIS — M542 Cervicalgia: Secondary | ICD-10-CM

## 2021-05-23 MED ORDER — GADOBENATE DIMEGLUMINE 529 MG/ML IV SOLN
20.0000 mL | Freq: Once | INTRAVENOUS | Status: AC | PRN
  Administered 2021-05-23: 20 mL via INTRAVENOUS

## 2021-08-09 ENCOUNTER — Ambulatory Visit: Payer: Medicare Other | Admitting: Neurology

## 2021-08-09 ENCOUNTER — Encounter: Payer: Self-pay | Admitting: Neurology

## 2021-08-09 ENCOUNTER — Other Ambulatory Visit: Payer: Self-pay

## 2021-08-09 VITALS — BP 180/87 | HR 65 | Ht 65.0 in | Wt 228.0 lb

## 2021-08-09 DIAGNOSIS — H81399 Other peripheral vertigo, unspecified ear: Secondary | ICD-10-CM

## 2021-08-09 DIAGNOSIS — R269 Unspecified abnormalities of gait and mobility: Secondary | ICD-10-CM

## 2021-08-09 DIAGNOSIS — R200 Anesthesia of skin: Secondary | ICD-10-CM

## 2021-08-09 DIAGNOSIS — G629 Polyneuropathy, unspecified: Secondary | ICD-10-CM | POA: Diagnosis not present

## 2021-08-09 DIAGNOSIS — G4486 Cervicogenic headache: Secondary | ICD-10-CM

## 2021-08-09 MED ORDER — IMIPRAMINE HCL 10 MG PO TABS: 10.0000 mg | ORAL_TABLET | Freq: Every day | ORAL | 5 refills | Status: DC

## 2021-08-09 NOTE — Progress Notes (Signed)
[facility]  PATIENT: Grace Delacruz DOB: [birth year]  REFERRING DOCTOR OR PCP: Stana Bunting, Rory Percy SOURCE: Patient, notes from primary care, imaging and laboratory reports, MRI images personally reviewed.  _________________________________   HISTORICAL  CHIEF COMPLAINT:  Chief Complaint  Patient presents with   Follow-up    Rm 1, w husband Mortimer Fries. Here for 3 month polyneuropathy f/u. Pt reports HA had stopped  after getting a cortisone shot but have returned last few weeks. 2 weeks ago pt felt her head blowing up and had some short hearing loss. Has happened about 4x since.     HISTORY OF PRESENT ILLNESS:  Ravinder Hofland is a 75 y.o. woman with  vertigo, headaches and gait disturbance and foot numbness  UPDATE10/20/2022: The MRI of the brain showed atrophy and chronic microvascular ischemic changes.  MRI of the cervical spine showed a normal spinal cord.  No spinal stenosis.  She has mild DJD.  Nothing was seen on either scan to explain her gait issues.  She has painful numbness in her fingers and toes.    She has NIDDM (since 2007).  She notes the numbness more when she is resting.  At times it can become very painful and other times it seems milder  At the last visit, we did an occipital nerve block/splenius capitus TPI for occipital neuralgia.  She felt she got 5 to 6 weeks of benefit and then symptoms have slowly returned but are not as bad now as they were at the last visit  She has lost 40 pounds on Ozempic for DM.  She has had intermittent vertigo since 2012 but notes several episodes of  lightheadedness this year. Usually she has been standing a while, frst time while shopping.   She feels her balance has gradually worsened.    She fell twice the last 2 months, once going down steps and once tripping over a small step going into a restaurant.    Onfall she broke her humerus.    She denies weakness in her legs.    She needs to hold the bannister on  stairs (has x many years).     She saw ENT and was told hearing was fine.    She had VNG at [facility] c/w benign paroxysmal positional vertigo.    Epley maneuvers have helped.     She has urinary frequency and nocturia x 3.    Vascular risks:  She has well controlled essential hypertension.   Never smoked.   She has Type 2 NIDDM > 10 years.    Images personally reviewed.   MRI brain 06/03/2019 showed age-appropriate mild generalized cortical atrophy, scattered T2/FLAIR hyperintense foci consistent with mild chronic microvascular ischemic change and an arachnoid cyst in the left middle fossa.  There were no acute findings.  Vertebral arteries are diminutive and the left terminates as the left posterior inferior cerebellar artery.  Posterior communicating arteries are present.  MRI [facility] 05/23/2021 showed  The internal auditory canals appear normal.     Scattered T2/FLAIR hyperintense foci in the hemispheres consistent with mild to moderate chronic microvascular ischemic change, unchanged compared to the previous MRI.   Mild generalized cortical atrophy, typical for age..   No acute findings.  Normal enhancement pattern.  MRI cervical spine 05/23/2021 showed    Mild degenerative changes at C3-C4 through C6-C7 that do not lead to nerve root compression or spinal stenosis.   The spinal cord is normal signal..   Asymmetric thyroid gland.  There appears to be surgical resection of the right hemithyroid and there is heterogenous signal in the left hemithyroid.    LABS: 05/10/2021:    B12, SPEP/IEF, ssa/ssb, RF were normal   REVIEW OF SYSTEMS: Constitutional: No fevers, chills, sweats, or change in appetite Eyes: No visual changes, double vision, eye pain.  She has dry eyes. Ear, nose and throat: No hearing loss, ear pain, nasal congestion, sore throat.  She had vertigo. Cardiovascular: No chest pain, palpitations Respiratory:  No shortness of breath at rest or with exertion.   No wheezes GastrointestinaI: No  nausea, vomiting, diarrhea, abdominal pain, fecal incontinence Genitourinary:  No dysuria, urinary retention or frequency.  2 x  nocturia Musculoskeletal:  No neck pain, back pain Integumentary: No rash, pruritus, skin lesions Neurological: as above Psychiatric: No depression at this time.  No anxiety Endocrine: Well controlled type 2 NIDDM Hematologic/Lymphatic:  No anemia, purpura, petechiae. Allergic/Immunologic: No itchy/runny eyes, nasal congestion, recent allergic reactions, rashes  ALLERGIES: Allergies  Allergen Reactions   Ephedrine Palpitations    Heart rate fast-   Hibiclens [Chlorhexidine Gluconate] Rash    SKIN REDNESS AND BURNING SENSATION    NO CHG   Keflex [Cephalexin] Itching and Rash    HOME MEDICATIONS:  Current Outpatient Medications:    aspirin EC 81 MG tablet, Take 81 mg by mouth at bedtime. , Disp: , Rfl:    docusate sodium (COLACE) 100 MG capsule, Take 100 mg by mouth at bedtime., Disp: , Rfl:    FLUoxetine (PROZAC) 20 MG capsule, Take 20 mg by mouth every morning. , Disp: , Rfl:    furosemide (LASIX) 40 MG tablet, Take 20 mg by mouth daily., Disp: , Rfl:    ibuprofen (ADVIL,MOTRIN) 200 MG tablet, Take 400 mg by mouth daily. , Disp: , Rfl:    imipramine (TOFRANIL) 10 MG tablet, Take 1 tablet (10 mg total) by mouth at bedtime., Disp: 30 tablet, Rfl: 5   Krill Oil 500 MG CAPS, Take 500 mg by mouth daily., Disp: , Rfl:    levothyroxine (SYNTHROID, LEVOTHROID) 100 MCG tablet, Take 100 mcg by mouth daily before breakfast. , Disp: , Rfl:    loratadine (CLARITIN) 10 MG tablet, Take 10 mg by mouth daily., Disp: , Rfl:    losartan (COZAAR) 100 MG tablet, Take 1 tablet (100 mg total) by mouth every evening., Disp: , Rfl:    meclizine (ANTIVERT) 25 MG tablet, Take 25 mg by mouth 3 (three) times daily as needed for dizziness. , Disp: , Rfl:    metFORMIN (GLUCOPHAGE-XR) 500 MG 24 hr tablet, Take 500 mg by mouth daily., Disp: , Rfl:    metoprolol succinate (TOPROL-XL) 100  MG 24 hr tablet, Take 100 mg by mouth every morning. Take with or immediately following a meal., Disp: , Rfl:    Multiple Vitamin (MULTIVITAMIN) tablet, Take 1 tablet by mouth daily., Disp: , Rfl:    omeprazole (PRILOSEC) 20 MG capsule, Take 20 mg by mouth daily before breakfast., Disp: , Rfl:    OZEMPIC, 0.25 OR 0.5 MG/DOSE, 2 MG/1.5ML SOPN, Inject 1 mL into the skin once a week., Disp: , Rfl:    simvastatin (ZOCOR) 40 MG tablet, Take 40 mg by mouth at bedtime., Disp: , Rfl:    triamcinolone cream (KENALOG) 0.5 %, Apply 1 application topically 3 (three) times daily as needed. For rash/irritation., Disp: , Rfl:    vitamin C (ASCORBIC ACID) 500 MG tablet, Take 500 mg by mouth daily., Disp: , Rfl:  Vitamin D, Cholecalciferol, 1000 UNITS CAPS, Take 1,000 mg by mouth 2 (two) times daily., Disp: , Rfl:   PAST MEDICAL HISTORY: Past Medical History:  Diagnosis Date   Anxiety    Depression    DJD (degenerative joint disease)    Esophageal reflux    Essential hypertension    Fibrocystic breast disease    Hypothyroidism    Left bundle branch block    Transient - possibly rate related    Menopause    Osteoarthritis    PONV (postoperative nausea and vomiting)    pt. would like to have a scopolamine patch   Restless leg syndrome    Scoliosis    Type 2 diabetes mellitus ([place])     PAST SURGICAL HISTORY: Past Surgical History:  Procedure Laterality Date   BREAST BIOPSY     lt x3   CARPOMETACARPEL SUSPENSION PLASTY Left 03/08/2014   Procedure: LEFT THUMB CARPOMETACARPEL ([place]) SUSPENSION PLASTY WITH TRAPEZIUM EXCISION;  Surgeon: [name]., MD;  Location: [facility];  Service: Orthopedics;  Laterality: Left;   CATARACT EXTRACTION Bilateral    CESAREAN SECTION     CHOLECYSTECTOMY     COLONOSCOPY  03/26/2007   RMR:  Diffuse changes of proctocolitis as described above diminutive rectal polyp, cold biopsy/removed, pedunculated polyp of the splenic flexure hot snare  removed.  Segmental biopsies the colon taken of the ileocecal valve ulcer biopsied normal terminal ileum   COLONOSCOPY N/A 01/05/2014   Procedure: COLONOSCOPY;  Surgeon: Rogene Houston, MD;  [facility];  Service: Endoscopy;  Laterality: N/A;  830   EYE SURGERY     GASTROCNEMIUS RECESSION Right 08/07/2016   Procedure: GASTROCNEMIUS RECESSION;  Surgeon: Newt Minion, MD;  Location: [facility];  Service: Orthopedics;  Laterality: Right;   Joint fusion-foot     x2 LEFT FOOT   KNEE ARTHROPLASTY  2001   Right   KNEE ARTHROPLASTY  2002   Left   LEFT HEART CATH AND CORONARY ANGIOGRAPHY N/A 06/18/2018   Procedure: LEFT HEART CATH AND CORONARY ANGIOGRAPHY;  Surgeon: Burnell Blanks, MD;  Location: [facility];  Service: Cardiovascular;  Laterality: N/A;   Left leg vein resection     ROTATOR CUFF REPAIR     LEFT   THYROIDECTOMY  1980   3/4   TONSILLECTOMY     TUBAL LIGATION     WEIL OSTEOTOMY Right 08/07/2016   Procedure: WEIL OSTEOTOMY 2nd and 3rd Metatarsal Right Foot;  Surgeon: Newt Minion, MD;  Location: [facility];  Service: Orthopedics;  Laterality: Right;   WEIL OSTEOTOMY Left 12/25/2016   Procedure: 2nd Toe Proximal Interphalangeal Resection, Weil Osteotomy 2nd and 3rd Metatarsal Left Foot;  Surgeon: Newt Minion, MD;  Location: [facility];  Service: Orthopedics;  Laterality: Left;    FAMILY HISTORY: Family History  Problem Relation Age of Onset   Heart failure Father        Died with pneumonia   Hypertension Father    Arthritis Father    Diabetes Father    Hypertension Mother    Asthma Paternal Grandfather    Emphysema Paternal Grandfather     SOCIAL HISTORY:  Social History   Socioeconomic History   Marital status: Married    Spouse name: Not on file   Number of children: 1   Years of education: Not on file   Highest education level: Not on file  Occupational History   Occupation: retired  Tobacco Use  Smoking status: Never   Smokeless tobacco:  Never  Substance and Sexual Activity   Alcohol use: Yes    Alcohol/week: 0.0 standard drinks    Comment: rare-- glass of wine at christmas   Drug use: No   Sexual activity: Not on file  Other Topics Concern   Not on file  Social History Narrative   Not on file   Social Determinants of Health   Financial Resource Strain: Not on file  Food Insecurity: Not on file  Transportation Needs: Not on file  Physical Activity: Not on file  Stress: Not on file  Social Connections: Not on file  Intimate Partner Violence: Not on file     PHYSICAL EXAM  Vitals:   08/09/21 1019  BP: (!) 180/87  Pulse: 65  Weight: 228 lb (103.4 kg)  Height: 5\' 5"  (1.651 m)    Body mass index is 37.94 kg/m.   General: The patient is well-developed and well-nourished and in no acute distress  HEENT:  Head is [place]/AT.  Sclera are anicteric.     Neck: No carotid bruits are noted.  The neck is tender over the occiput left > right  Skin: Extremities are without rash or  edema.   Varicose veins    Neurologic Exam  Mental status: The patient is alert and oriented x 3 at the time of the examination. The patient has apparent normal recent and remote memory, with an apparently normal attention span and concentration ability.   Speech is normal.  Cranial nerves: Extraocular movements are full. Facial strength and sensation are normal.     Mild reduced right hearing and Weber lateralizes right.     Motor:  Muscle bulk is normal.   Tone is normal. Strength is  5 / 5 in all 4 extremities including toes.   Sensory: Sensory testing is intact to pinprick, soft touch and vibration sensation in arms but reduced vibration at ankles (75%) and toes (10%)   Reduced touch/pp at toes to ankles.  Coordination: Cerebellar testing reveals good finger-nose-finger and heel-to-shin bilaterally.  Gait and station: Station is normal.   Gait is normal for age. Tandem gait is wide .  The was no Romberg sign    Reflexes: Deep  tendon reflexes are symmetric and normal in arms, absent in legs.        ASSESSMENT AND PLAN   Polyneuropathy  Gait disturbance  Other peripheral vertigo, unspecified ear  Cervicogenic headache  Numbness  Patient MRI of the brain and cervical spine showed age-related changes but no acute findings and nothing that would explain her gait balance. Low-dose imipramine for headache, dysesthesias, nocturia and insomnia.  She should stop if you notice any cognitive change.  We could also consider changing fluoxetine to Cymbalta.  Amel Gianino A. Felecia Shelling, MD, [facility] 17/79/3903, 0:09 PM Certified in Neurology, Clinical Neurophysiology, Sleep Medicine and Neuroimaging  [facility] [address] [place], [place] [address] [phone]

## 2021-12-17 ENCOUNTER — Encounter: Payer: Self-pay | Admitting: Family Medicine

## 2021-12-17 ENCOUNTER — Ambulatory Visit: Payer: Medicare Other | Admitting: Family Medicine

## 2021-12-17 VITALS — BP 147/76 | HR 78 | Ht 64.0 in | Wt 222.0 lb

## 2021-12-17 DIAGNOSIS — G4486 Cervicogenic headache: Secondary | ICD-10-CM

## 2021-12-17 DIAGNOSIS — G4700 Insomnia, unspecified: Secondary | ICD-10-CM | POA: Diagnosis not present

## 2021-12-17 DIAGNOSIS — H81399 Other peripheral vertigo, unspecified ear: Secondary | ICD-10-CM

## 2021-12-17 DIAGNOSIS — G629 Polyneuropathy, unspecified: Secondary | ICD-10-CM | POA: Diagnosis not present

## 2021-12-17 MED ORDER — GABAPENTIN 300 MG PO CAPS: 300.0000 mg | ORAL_CAPSULE | Freq: Two times a day (BID) | ORAL | 11 refills | Status: DC

## 2021-12-17 NOTE — Patient Instructions (Signed)
Below is our plan:  We will wean imipramine. Starting tonight, skip every other day for 2 weeks. If doing, stop imipramine. Start gabapentin 326m every night at bedtime. Monitor for worsening dizziness. Let met know if you have any significant side effects. If will tolerated in 2-3 weeks you can increase dose to twice daily.   Please make sure you are staying well hydrated. I recommend 50-60 ounces daily. Well balanced diet and regular exercise encouraged. Consistent sleep schedule with 6-8 hours recommended.   Please continue follow up with care team as directed.   Follow up with me in 6 months   You may receive a survey regarding today's visit. I encourage you to leave honest feed back as I do use this information to improve patient care. Thank you for seeing me today!

## 2021-12-17 NOTE — Progress Notes (Signed)
Chief Complaint  Patient presents with   Follow-up    Rm 2, w husband. Here for 4 month polyneuropathy and HA. Pt has not noticed significant change with imipramine. Pt has been more constipated. Pt continues to have dizziness at night when turning from L to R side.      HISTORY OF PRESENT ILLNESS:  12/17/21 ALL:  Grace Delacruz is a 76 y.o. female here today for follow up for vertigo, headaches, and foot numbness.   She was last seen by Dr Felecia Shelling 07/2021 and started on imipramine 10mg  QHS for headaches, dysesthesias, nocturia and insomnia. She feels that it has helped headaches but not certain it has helped with neuropathy discomfort. She continues to have sharp pain in feet, especially at night. She denies changes to gait. She denies falls. She continues to have difficulty getting to and staying asleep. She has noted significant constipation over the past 2 months. Vertigo is stable.    HISTORY (copied from Dr Garth Bigness previous note)  Grace Delacruz is a 76 y.o. woman with  vertigo, headaches and gait disturbance and foot numbness   UPDATE10/20/2022: The MRI of the brain showed atrophy and chronic microvascular ischemic changes.  MRI of the cervical spine showed a normal spinal cord.  No spinal stenosis.  She has mild DJD.  Nothing was seen on either scan to explain her gait issues.  She has painful numbness in her fingers and toes.    She has NIDDM (since 2007).  She notes the numbness more when she is resting.  At times it can become very painful and other times it seems milder   At the last visit, we did an occipital nerve block/splenius capitus TPI for occipital neuralgia.  She felt she got 5 to 6 weeks of benefit and then symptoms have slowly returned but are not as bad now as they were at the last visit   She has lost 40 pounds on Ozempic for DM.   She has had intermittent vertigo since 2012 but notes several episodes of  lightheadedness this year. Usually she has been standing  a while, frst time while shopping.   She feels her balance has gradually worsened.    She fell twice the last 2 months, once going down steps and once tripping over a small step going into a restaurant.    Onfall she broke her humerus.    She denies weakness in her legs.    She needs to hold the bannister on stairs (has x many years).     She saw ENT and was told hearing was fine.    She had VNG at [facility] c/w benign paroxysmal positional vertigo.    Epley maneuvers have helped.      She has urinary frequency and nocturia x 3.     Vascular risks:  She has well controlled essential hypertension.   Never smoked.   She has Type 2 NIDDM > 10 years.     Images personally reviewed.   MRI brain 06/03/2019 showed age-appropriate mild generalized cortical atrophy, scattered T2/FLAIR hyperintense foci consistent with mild chronic microvascular ischemic change and an arachnoid cyst in the left middle fossa.  There were no acute findings.  Vertebral arteries are diminutive and the left terminates as the left posterior inferior cerebellar artery.  Posterior communicating arteries are present.   MRI [facility] 05/23/2021 showed  The internal auditory canals appear normal.     Scattered T2/FLAIR hyperintense foci in the hemispheres consistent with  mild to moderate chronic microvascular ischemic change, unchanged compared to the previous MRI.   Mild generalized cortical atrophy, typical for age..   No acute findings.  Normal enhancement pattern.   MRI cervical spine 05/23/2021 showed    Mild degenerative changes at C3-C4 through C6-C7 that do not lead to nerve root compression or spinal stenosis.   The spinal cord is normal signal..   Asymmetric thyroid gland.  There appears to be surgical resection of the right hemithyroid and there is heterogenous signal in the left hemithyroid.   LABS: 05/10/2021:    B12, SPEP/IEF, ssa/ssb, RF were normal    REVIEW OF SYSTEMS: Out of a complete 14 system review of symptoms, the patient  complains only of the following symptoms, dizziness, numbness and sharp pains of feet, headaches and all other reviewed systems are negative.   ALLERGIES: Allergies  Allergen Reactions   Ephedrine Palpitations    Heart rate fast-   Hibiclens [Chlorhexidine Gluconate] Rash    SKIN REDNESS AND BURNING SENSATION    NO CHG   Keflex [Cephalexin] Itching and Rash     HOME MEDICATIONS: Outpatient Medications Prior to Visit  Medication Sig Dispense Refill   aspirin EC 81 MG tablet Take 81 mg by mouth at bedtime.      docusate sodium (COLACE) 100 MG capsule Take 100 mg by mouth at bedtime.     FLUoxetine (PROZAC) 20 MG capsule Take 20 mg by mouth every morning.      furosemide (LASIX) 40 MG tablet Take 20 mg by mouth daily.     ibuprofen (ADVIL,MOTRIN) 200 MG tablet Take 400 mg by mouth daily.      Krill Oil 500 MG CAPS Take 500 mg by mouth daily.     levothyroxine (SYNTHROID, LEVOTHROID) 100 MCG tablet Take 100 mcg by mouth daily before breakfast.      loratadine (CLARITIN) 10 MG tablet Take 10 mg by mouth daily.     losartan (COZAAR) 100 MG tablet Take 1 tablet (100 mg total) by mouth every evening.     meclizine (ANTIVERT) 25 MG tablet Take 25 mg by mouth 3 (three) times daily as needed for dizziness.      metFORMIN (GLUCOPHAGE-XR) 500 MG 24 hr tablet Take 500 mg by mouth daily.     metoprolol succinate (TOPROL-XL) 100 MG 24 hr tablet Take 100 mg by mouth every morning. Take with or immediately following a meal.     Multiple Vitamin (MULTIVITAMIN) tablet Take 1 tablet by mouth daily.     omeprazole (PRILOSEC) 20 MG capsule Take 20 mg by mouth daily before breakfast.     OZEMPIC, 0.25 OR 0.5 MG/DOSE, 2 MG/1.5ML SOPN Inject 1 mL into the skin once a week.     simvastatin (ZOCOR) 40 MG tablet Take 40 mg by mouth at bedtime.     triamcinolone cream (KENALOG) 0.5 % Apply 1 application topically 3 (three) times daily as needed. For rash/irritation.     vitamin C (ASCORBIC ACID) 500 MG tablet  Take 500 mg by mouth daily.     Vitamin D, Cholecalciferol, 1000 UNITS CAPS Take 1,000 mg by mouth 2 (two) times daily.     imipramine (TOFRANIL) 10 MG tablet Take 1 tablet (10 mg total) by mouth at bedtime. 30 tablet 5   No facility-administered medications prior to visit.     PAST MEDICAL HISTORY: Past Medical History:  Diagnosis Date   Anxiety    Depression    DJD (degenerative joint disease)  Esophageal reflux    Essential hypertension    Fibrocystic breast disease    Hypothyroidism    Left bundle branch block    Transient - possibly rate related    Menopause    Osteoarthritis    PONV (postoperative nausea and vomiting)    pt. would like to have a scopolamine patch   Restless leg syndrome    Scoliosis    Type 2 diabetes mellitus ([place])      PAST SURGICAL HISTORY: Past Surgical History:  Procedure Laterality Date   BREAST BIOPSY     lt x3   CARPOMETACARPEL SUSPENSION PLASTY Left 03/08/2014   Procedure: LEFT THUMB CARPOMETACARPEL ([place]) SUSPENSION PLASTY WITH TRAPEZIUM EXCISION;  Surgeon: [name]., MD;  Location: [place];  Service: Orthopedics;  Laterality: Left;   CATARACT EXTRACTION Bilateral    CESAREAN SECTION     CHOLECYSTECTOMY     COLONOSCOPY  03/26/2007   RMR:  Diffuse changes of proctocolitis as described above diminutive rectal polyp, cold biopsy/removed, pedunculated polyp of the splenic flexure hot snare removed.  Segmental biopsies the colon taken of the ileocecal valve ulcer biopsied normal terminal ileum   COLONOSCOPY N/A 01/05/2014   Procedure: COLONOSCOPY;  Surgeon: Rogene Houston, MD;  [facility];  Service: Endoscopy;  Laterality: N/A;  830   EYE SURGERY     GASTROCNEMIUS RECESSION Right 08/07/2016   Procedure: GASTROCNEMIUS RECESSION;  Surgeon: Newt Minion, MD;  Location: [place];  Service: Orthopedics;  Laterality: Right;   Joint fusion-foot     x2 LEFT FOOT   KNEE ARTHROPLASTY  2001   Right   KNEE  ARTHROPLASTY  2002   Left   LEFT HEART CATH AND CORONARY ANGIOGRAPHY N/A 06/18/2018   Procedure: LEFT HEART CATH AND CORONARY ANGIOGRAPHY;  Surgeon: Burnell Blanks, MD;  Location: [place] [facility];  Service: Cardiovascular;  Laterality: N/A;   Left leg vein resection     ROTATOR CUFF REPAIR     LEFT   THYROIDECTOMY  1980   3/4   TONSILLECTOMY     TUBAL LIGATION     WEIL OSTEOTOMY Right 08/07/2016   Procedure: WEIL OSTEOTOMY 2nd and 3rd Metatarsal Right Foot;  Surgeon: Newt Minion, MD;  Location: [place];  Service: Orthopedics;  Laterality: Right;   WEIL OSTEOTOMY Left 12/25/2016   Procedure: 2nd Toe Proximal Interphalangeal Resection, Weil Osteotomy 2nd and 3rd Metatarsal Left Foot;  Surgeon: Newt Minion, MD;  Location: [place];  Service: Orthopedics;  Laterality: Left;     FAMILY HISTORY: Family History  Problem Relation Age of Onset   Heart failure Father        Died with pneumonia   Hypertension Father    Arthritis Father    Diabetes Father    Hypertension Mother    Asthma Paternal Grandfather    Emphysema Paternal Grandfather      SOCIAL HISTORY: Social History   Socioeconomic History   Marital status: Married    Spouse name: Not on file   Number of children: 1   Years of education: Not on file   Highest education level: Not on file  Occupational History   Occupation: retired  Tobacco Use   Smoking status: Never   Smokeless tobacco: Never  Substance and Sexual Activity   Alcohol use: Yes    Alcohol/week: 0.0 standard drinks    Comment: rare-- glass of wine at christmas   Drug use: No   Sexual  activity: Not on file  Other Topics Concern   Not on file  Social History Narrative   Not on file   Social Determinants of Health   Financial Resource Strain: Not on file  Food Insecurity: Not on file  Transportation Needs: Not on file  Physical Activity: Not on file  Stress: Not on file  Social Connections: Not on file  Intimate Partner Violence: Not  on file     PHYSICAL EXAM  Vitals:   12/17/21 1114  BP: (!) 147/76  Pulse: 78  Weight: 222 lb (100.7 kg)  Height: 5\' 4"  (1.626 m)   Body mass index is 38.11 kg/m.  Generalized: Well developed, in no acute distress  Cardiology: normal rate and rhythm, no murmur auscultated  Respiratory: clear to auscultation bilaterally    Neurological examination  Mentation: Alert oriented to time, place, history taking. Follows all commands speech and language fluent Cranial nerve II-XII: Pupils were equal round reactive to light. Extraocular movements were full, visual field were full on confrontational test. Facial sensation and strength were normal. Head turning and shoulder shrug  were normal and symmetric. Motor: The motor testing reveals 5 over 5 strength of all 4 extremities. Good symmetric motor tone is noted throughout.  Sensory: Sensory testing is intact to soft touch on all 4 extremities. No evidence of extinction is noted.  Gait and station: Gait is normal.    DIAGNOSTIC DATA (LABS, IMAGING, TESTING) - I reviewed patient records, labs, notes, testing and imaging myself where available.  Lab Results  Component Value Date   WBC 7.2 06/12/2018   HGB 14.4 06/12/2018   HCT 42.6 06/12/2018   MCV 90.1 06/12/2018   PLT 223 06/12/2018      Component Value Date/Time   NA 142 06/12/2018 1151   K 3.9 06/12/2018 1151   CL 105 06/12/2018 1151   CO2 29 06/12/2018 1151   GLUCOSE 157 (H) 06/12/2018 1151   BUN 17 06/12/2018 1151   CREATININE 1.17 (H) 06/12/2018 1151   CALCIUM 9.7 06/12/2018 1151   PROT 6.6 05/10/2021 1124   GFRNONAA 46 (L) 06/12/2018 1151   GFRAA 53 (L) 06/12/2018 1151   No results found for: CHOL, HDL, LDLCALC, LDLDIRECT, TRIG, CHOLHDL Lab Results  Component Value Date   HGBA1C 6.6 (H) 12/20/2016   Lab Results  Component Value Date   GLOVFIEP32 951 05/10/2021   No results found for: TSH  No flowsheet data found.   No flowsheet data  found.   ASSESSMENT AND PLAN  76 y.o. year old female  has a past medical history of Anxiety, Depression, DJD (degenerative joint disease), Esophageal reflux, Essential hypertension, Fibrocystic breast disease, Hypothyroidism, Left bundle branch block, Menopause, Osteoarthritis, PONV (postoperative nausea and vomiting), Restless leg syndrome, Scoliosis, and Type 2 diabetes mellitus ([place]). here with    Polyneuropathy  Other peripheral vertigo, unspecified ear  Cervicogenic headache  Insomnia, unspecified type  [place] does not feel imipramine has been very effective in managing pain or insomnia but has been helpful in headaches. She is having significant constipation with increased use of laxatives. I will have her wean imipramine. She was advised to take every other day for 2 weeks, then stop. We will add gabapentin 300mg  QHS. Side effects reviewed. If well tolerated in two weeks, she may increase dose to 300mg  BID. She will focus on healthy lifestyle habits and sleep hygiene. Close follow up with PCP advised. She will return to see me in 6 months, sooner if needed.  No orders of the defined types were placed in this encounter.    Meds ordered this encounter  Medications   gabapentin (NEURONTIN) 300 MG capsule    Sig: Take 1 capsule (300 mg total) by mouth 2 (two) times daily.    Dispense:  60 capsule    Refill:  11    Order Specific Question:   Supervising Provider    Answer:   Melvenia Beam [[id number]]      Debbora Presto, MSN, FNP-C 12/17/2021, 11:57 AM  [facility] [address] [place], [place] [address] [phone]

## 2021-12-31 ENCOUNTER — Ambulatory Visit (INDEPENDENT_AMBULATORY_CARE_PROVIDER_SITE_OTHER): Payer: Medicare Other

## 2021-12-31 ENCOUNTER — Other Ambulatory Visit: Payer: Self-pay

## 2021-12-31 ENCOUNTER — Ambulatory Visit: Payer: Medicare Other | Admitting: Orthopedic Surgery

## 2021-12-31 DIAGNOSIS — M79672 Pain in left foot: Secondary | ICD-10-CM

## 2022-01-01 ENCOUNTER — Encounter: Payer: Self-pay | Admitting: Orthopedic Surgery

## 2022-01-01 NOTE — Progress Notes (Signed)
? ?Office Visit Note ?  ?Patient: Grace Delacruz           ?Date of Birth: 08/14/1946           ?MRN: [id number] ?Visit Date: 12/31/2021 ?             ?Requested by: [place] Nation, MD ?[address] ?[place],  [place] [address] ?PCP: Lanagan Nation, MD ? ?Chief Complaint  ?Patient presents with  ? Left Foot - Pain  ? ? ? ? ?HPI: ?Patient is a 76 year old woman who is seen for evaluation of left foot pain.  She complains of a painful callus beneath the fourth metatarsal head she states she has pain to walk without shoes.  She states that she has a callus over the tip of the second toe and she has had episodes of bleeding when she hits the toe.  She is status post multiple surgical interventions she has had a Weil osteotomy for the second and third metatarsal she has undergone a fusion of the base of the first metatarsal and fusion across the base of the fourth and fifth metatarsals with 2 retained plates laterally.  She has had a second toe PIP resection and revision fusion of a malunion. ? ?Assessment & Plan: ?Visit Diagnoses:  ?1. Pain in left foot   ? ? ?Plan: Recommended a stiff soled new balance sneaker plus sole orthotics to unload the metatarsal heads.  Discussed that we could modify the orthotic of the sole orthotics do not completely remove her symptoms.  A surgical option would be to proceed with a Weil osteotomy of the fourth metatarsal. ? ?Follow-Up Instructions: Return if symptoms worsen or fail to improve.  ? ?Ortho Exam ? ?Patient is alert, oriented, no adenopathy, well-dressed, normal affect, normal respiratory effort. ?Examination patient has a good dorsalis pedis pulse she does have venous stasis swelling.  She has callus and pain to palpation beneath the fourth metatarsal head and this reproduces her symptoms.  She does not have pain with range of motion of the MTP joint which shows arthritic changes by radiograph.  The callus was pared beneath the fourth metatarsal head there is no evidence of  infection.  Patient also has a callus over the tip of the second toe and this appears to be superficial there is no sausage digit swelling no signs of infection. ? ?Imaging: ?XR Foot 2 Views Left ? ?Result Date: 01/01/2022 ?Three-view radiographs of the left foot shows previous fusion base of the first metatarsal and base of the fourth and fifth metatarsals with Weil osteotomies of the second and third metatarsal and fusion of the PIP joint of the second toe.  Patient has degenerative arthritis of the first MTP joint.  ?No images are attached to the encounter. ? ?Labs: ?Lab Results  ?Component Value Date  ? HGBA1C 6.6 (H) 12/20/2016  ? HGBA1C 6.7 (H) 08/05/2016  ? ? ? ?No results found for: ALBUMIN, PREALBUMIN, CBC ? ?No results found for: MG ?No results found for: VD25OH ? ?No results found for: PREALBUMIN ?CBC EXTENDED Latest Ref Rng & Units 06/12/2018 12/20/2016 08/05/2016  ?WBC 4.0 - 10.5 K/uL 7.2 9.9 9.5  ?RBC 3.87 - 5.11 MIL/uL 4.73 4.77 4.67  ?HGB 12.0 - 15.0 g/dL 14.4 14.4 14.2  ?HCT 36.0 - 46.0 % 42.6 43.4 42.7  ?PLT 150 - 400 K/uL 223 243 254  ? ? ? ?There is no height or weight on file to calculate BMI. ? ?Orders:  ?Orders Placed  This Encounter  ?Procedures  ? XR Foot 2 Views Left  ? ?No orders of the defined types were placed in this encounter. ? ? ? Procedures: ?No procedures performed ? ?Clinical Data: ?No additional findings. ? ?ROS: ? ?All other systems negative, except as noted in the HPI. ?Review of Systems ? ?Objective: ?Vital Signs: There were no vitals taken for this visit. ? ?Specialty Comments:  ?No specialty comments available. ? ?PMFS History: ?Patient Active Problem List  ? Diagnosis Date Noted  ? Polyneuropathy 05/10/2021  ? Gait disturbance 05/10/2021  ? Numbness 05/10/2021  ? Neck pain 05/10/2021  ? Other peripheral vertigo, unspecified ear 05/10/2021  ? Cervicogenic headache 05/10/2021  ? Abnormal myocardial perfusion study   ? Presence of retained hardware   ? Fused toes of left foot  11/28/2016  ? Acquired claw toe, left   ? Acquired contracture of Achilles tendon, right   ? Abnormal chest x-ray 10/13/2014  ? Snoring 10/04/2013  ? Left bundle branch block 08/24/2013  ? Essential hypertension, benign 06/19/2012  ? Type 2 diabetes mellitus ([place]) 06/19/2012  ? ?Past Medical History:  ?Diagnosis Date  ? Anxiety   ? Depression   ? DJD (degenerative joint disease)   ? Esophageal reflux   ? Essential hypertension   ? Fibrocystic breast disease   ? Hypothyroidism   ? Left bundle branch block   ? Transient - possibly rate related   ? Menopause   ? Osteoarthritis   ? PONV (postoperative nausea and vomiting)   ? pt. would like to have a scopolamine patch  ? Restless leg syndrome   ? Scoliosis   ? Type 2 diabetes mellitus ([place])   ?  ?Family History  ?Problem Relation Age of Onset  ? Heart failure Father   ?     Died with pneumonia  ? Hypertension Father   ? Arthritis Father   ? Diabetes Father   ? Hypertension Mother   ? Asthma Paternal Grandfather   ? Emphysema Paternal Grandfather   ?  ?Past Surgical History:  ?Procedure Laterality Date  ? BREAST BIOPSY    ? lt x3  ? CARPOMETACARPEL SUSPENSION PLASTY Left 03/08/2014  ? Procedure: LEFT THUMB CARPOMETACARPEL ([place]) SUSPENSION PLASTY WITH TRAPEZIUM EXCISION;  Surgeon: [name]., MD;  Location: [place];  Service: Orthopedics;  Laterality: Left;  ? CATARACT EXTRACTION Bilateral   ? CESAREAN SECTION    ? CHOLECYSTECTOMY    ? COLONOSCOPY  03/26/2007  ? RMR:  Diffuse changes of proctocolitis as described above diminutive rectal polyp, cold biopsy/removed, pedunculated polyp of the splenic flexure hot snare removed.  Segmental biopsies the colon taken of the ileocecal valve ulcer biopsied normal terminal ileum  ? COLONOSCOPY N/A 01/05/2014  ? Procedure: COLONOSCOPY;  Surgeon: Rogene Houston, MD;  [facility];  Service: Endoscopy;  Laterality: N/A;  830  ? EYE SURGERY    ? GASTROCNEMIUS RECESSION Right 08/07/2016  ? Procedure:  GASTROCNEMIUS RECESSION;  Surgeon: Newt Minion, MD;  Location: [facility];  Service: Orthopedics;  Laterality: Right;  ? Joint fusion-foot    ? x2 LEFT FOOT  ? KNEE ARTHROPLASTY  2001  ? Right  ? KNEE ARTHROPLASTY  2002  ? Left  ? LEFT HEART CATH AND CORONARY ANGIOGRAPHY N/A 06/18/2018  ? Procedure: LEFT HEART CATH AND CORONARY ANGIOGRAPHY;  Surgeon: Burnell Blanks, MD;  Location: [facility];  Service: Cardiovascular;  Laterality: N/A;  ? Left leg vein resection    ?  ROTATOR CUFF REPAIR    ? LEFT  ? THYROIDECTOMY  1980  ? 3/4  ? TONSILLECTOMY    ? TUBAL LIGATION    ? WEIL OSTEOTOMY Right 08/07/2016  ? Procedure: WEIL OSTEOTOMY 2nd and 3rd Metatarsal Right Foot;  Surgeon: Newt Minion, MD;  Location: [place];  Service: Orthopedics;  Laterality: Right;  ? WEIL OSTEOTOMY Left 12/25/2016  ? Procedure: 2nd Toe Proximal Interphalangeal Resection, Weil Osteotomy 2nd and 3rd Metatarsal Left Foot;  Surgeon: Newt Minion, MD;  Location: [facility];  Service: Orthopedics;  Laterality: Left;  ? ?Social History  ? ?Occupational History  ? Occupation: retired  ?Tobacco Use  ? Smoking status: Never  ? Smokeless tobacco: Never  ?Substance and Sexual Activity  ? Alcohol use: Yes  ?  Alcohol/week: 0.0 standard drinks  ?  Comment: rare-- glass of wine at christmas  ? Drug use: No  ? Sexual activity: Not on file  ? ? ? ? ? ?

## 2022-01-15 ENCOUNTER — Encounter (INDEPENDENT_AMBULATORY_CARE_PROVIDER_SITE_OTHER): Payer: Self-pay | Admitting: *Deleted

## 2022-02-25 HISTORY — PX: RETINAL DETACHMENT SURGERY: SHX105

## 2022-03-04 ENCOUNTER — Other Ambulatory Visit: Payer: Self-pay

## 2022-03-04 MED ORDER — GABAPENTIN 300 MG PO CAPS: 300.0000 mg | ORAL_CAPSULE | Freq: Two times a day (BID) | ORAL | 8 refills | Status: DC

## 2022-03-26 ENCOUNTER — Ambulatory Visit (INDEPENDENT_AMBULATORY_CARE_PROVIDER_SITE_OTHER): Payer: Medicare Other | Admitting: Gastroenterology

## 2022-03-26 ENCOUNTER — Encounter (INDEPENDENT_AMBULATORY_CARE_PROVIDER_SITE_OTHER): Payer: Self-pay | Admitting: Gastroenterology

## 2022-03-26 VITALS — BP 128/81 | HR 72 | Temp 97.6°F | Ht 64.0 in | Wt 232.2 lb

## 2022-03-26 DIAGNOSIS — K5900 Constipation, unspecified: Secondary | ICD-10-CM | POA: Diagnosis not present

## 2022-03-26 NOTE — Patient Instructions (Signed)
I will obtain recent labs from your PCP for review as I suspect your thyroid could be playing a role in your constipation. Please make sure you are drinking atleast eight 8 oz glasses of water per day Diet high in fruits, veggies, and whole grains, especially kiwi/prunes as these are very good for constipation I am providing you with samples of trulance, you will take this once daily in the morning with or without food,  please note this can cause some looser stools initially but should subsided after a week or two, If you find that this works well for you, I can send a prescription to your pharmacy.   Follow up 3 months

## 2022-03-26 NOTE — Progress Notes (Signed)
Referring Provider: Avery Nation, MD Primary Care Physician:  Ivanhoe Nation, MD Primary GI Physician: new  Chief Complaint  Patient presents with   Abdominal Pain    Patient here today with complaints of abdominal pain and constipation on going for the last six months.   HPI:   Grace Delacruz is a 76 y.o. female with past medical history of anxiety, depression, DJD, GERD, HTN, hypothyroidism, LBBB, OA, RLS, Type 2 DM.   Patient presenting today as a new patient for abdominal pain and constipation.   Last labs done 10/31/21 with normal CMP.  Patient states that about 6 months ago, she began having constipation, she could go 3-4 days without a BM, started taking laxative which helped. She denies any changes in her medications. States that she had thyroid function checked a few weeks ago and was told it was a little low, she reports she has had partial thyroidectomy. She endorses some RLQ abdominal pain that usually improves with a BM, pain is sometimes severe cramping in nature. She has not had to take anything to help with this. She does note that she has been on ozempic for 1.5 years and has lost about 40 pounds. Cant seem to lose anymore. States that diet is a wide variety. She denies any rectal bleeding or melena. No nausea or vomiting.  Feels that she drinks a decent amount of water. She would take a laxative if she went more than 3 days without a BM. She did feels that when she took the laxative she would go about 24 hours before having a BM then would have a large "blow out" She does note some bloating at times.    NSAID use: ibuprofen on occasion  Social hx: no etoh or alcohol  Fam hx: no crc or liver disease  Last Colonoscopy:January 05 2014, Examination performed to cecum. No evidence of endoscopic colitis or scarring. Mild sigmoid colon diverticulosis. External hemorrhoids. Last Endoscopy:never  Recommendations:  Repeat colonoscopy in 2025  Past Medical History:   Diagnosis Date   Anxiety    Depression    DJD (degenerative joint disease)    Esophageal reflux    Essential hypertension    Fibrocystic breast disease    Hypothyroidism    Left bundle branch block    Transient - possibly rate related    Menopause    Osteoarthritis    PONV (postoperative nausea and vomiting)    pt. would like to have a scopolamine patch   Restless leg syndrome    Scoliosis    Type 2 diabetes mellitus ([place])     Past Surgical History:  Procedure Laterality Date   BREAST BIOPSY     lt x3   CARPOMETACARPEL SUSPENSION PLASTY Left 03/08/2014   Procedure: LEFT THUMB CARPOMETACARPEL ([place]) SUSPENSION PLASTY WITH TRAPEZIUM EXCISION;  Surgeon: [name]., MD;  Location: [place];  Service: Orthopedics;  Laterality: Left;   CATARACT EXTRACTION Bilateral    CESAREAN SECTION     CHOLECYSTECTOMY     COLONOSCOPY  03/26/2007   RMR:  Diffuse changes of proctocolitis as described above diminutive rectal polyp, cold biopsy/removed, pedunculated polyp of the splenic flexure hot snare removed.  Segmental biopsies the colon taken of the ileocecal valve ulcer biopsied normal terminal ileum   COLONOSCOPY N/A 01/05/2014   Procedure: COLONOSCOPY;  Surgeon: Rogene Houston, MD;  [facility];  Service: Endoscopy;  Laterality: N/A;  830   EYE SURGERY  GASTROCNEMIUS RECESSION Right 08/07/2016   Procedure: GASTROCNEMIUS RECESSION;  Surgeon: Newt Minion, MD;  Location: [facility];  Service: Orthopedics;  Laterality: Right;   Joint fusion-foot     x2 LEFT FOOT   KNEE ARTHROPLASTY  2001   Right   KNEE ARTHROPLASTY  2002   Left   LEFT HEART CATH AND CORONARY ANGIOGRAPHY N/A 06/18/2018   Procedure: LEFT HEART CATH AND CORONARY ANGIOGRAPHY;  Surgeon: Burnell Blanks, MD;  Location: [facility];  Service: Cardiovascular;  Laterality: N/A;   Left leg vein resection     ROTATOR CUFF REPAIR     LEFT   THYROIDECTOMY  1980   3/4   TONSILLECTOMY      TUBAL LIGATION     WEIL OSTEOTOMY Right 08/07/2016   Procedure: WEIL OSTEOTOMY 2nd and 3rd Metatarsal Right Foot;  Surgeon: Newt Minion, MD;  Location: [place];  Service: Orthopedics;  Laterality: Right;   WEIL OSTEOTOMY Left 12/25/2016   Procedure: 2nd Toe Proximal Interphalangeal Resection, Weil Osteotomy 2nd and 3rd Metatarsal Left Foot;  Surgeon: Newt Minion, MD;  Location: [place];  Service: Orthopedics;  Laterality: Left;    Current Outpatient Medications  Medication Sig Dispense Refill   aspirin EC 81 MG tablet Take 81 mg by mouth at bedtime.      cyanocobalamin 1000 MCG tablet Take 1,000 mcg by mouth daily.     docusate sodium (COLACE) 100 MG capsule Take 200 mg by mouth at bedtime.     FLUoxetine (PROZAC) 20 MG capsule Take 20 mg by mouth every morning.      furosemide (LASIX) 40 MG tablet Take 40 mg by mouth daily.     gabapentin (NEURONTIN) 300 MG capsule Take 1 capsule (300 mg total) by mouth 2 (two) times daily. 60 capsule 8   ibuprofen (ADVIL,MOTRIN) 200 MG tablet Take 400 mg by mouth daily.      Krill Oil 500 MG CAPS Take 500 mg by mouth daily.     levothyroxine (SYNTHROID, LEVOTHROID) 100 MCG tablet Take 100 mcg by mouth daily before breakfast.      loratadine (CLARITIN) 10 MG tablet Take 10 mg by mouth daily.     losartan (COZAAR) 100 MG tablet Take 1 tablet (100 mg total) by mouth every evening.     meclizine (ANTIVERT) 25 MG tablet Take 12.5 mg by mouth at bedtime.     metFORMIN (GLUCOPHAGE-XR) 500 MG 24 hr tablet Take 500 mg by mouth daily.     metoprolol succinate (TOPROL-XL) 100 MG 24 hr tablet Take 100 mg by mouth every morning. Take with or immediately following a meal.     Multiple Vitamin (MULTIVITAMIN) tablet Take 1 tablet by mouth daily.     omeprazole (PRILOSEC) 20 MG capsule Take 20 mg by mouth daily before breakfast.     OZEMPIC, 0.25 OR 0.5 MG/DOSE, 2 MG/1.5ML SOPN Inject 1 mL into the skin once a week.     rosuvastatin (CRESTOR) 10 MG tablet Take 10 mg by  mouth daily.     triamcinolone cream (KENALOG) 0.5 % Apply 1 application topically 3 (three) times daily as needed. For rash/irritation.     vitamin C (ASCORBIC ACID) 500 MG tablet Take 500 mg by mouth daily.     Vitamin D, Cholecalciferol, 1000 UNITS CAPS Take 1,000 mg by mouth 2 (two) times daily.     No current facility-administered medications for this visit.    Allergies as of 03/26/2022 - Review Complete  03/26/2022  Allergen Reaction Noted   Ephedrine Palpitations 03/03/2014   Hibiclens [chlorhexidine gluconate] Rash 06/29/2012   Keflex [cephalexin] Itching and Rash 06/29/2012    Family History  Problem Relation Age of Onset   Heart failure Father        Died with pneumonia   Hypertension Father    Arthritis Father    Diabetes Father    Hypertension Mother    Asthma Paternal Grandfather    Emphysema Paternal Grandfather     Social History   Socioeconomic History   Marital status: Married    Spouse name: Not on file   Number of children: 1   Years of education: Not on file   Highest education level: Not on file  Occupational History   Occupation: retired  Tobacco Use   Smoking status: Never   Smokeless tobacco: Never  Vaping Use   Vaping Use: Never used  Substance and Sexual Activity   Alcohol use: Yes    Alcohol/week: 0.0 standard drinks    Comment: rare-- glass of wine at christmas   Drug use: No   Sexual activity: Not on file  Other Topics Concern   Not on file  Social History Narrative   Not on file   Social Determinants of Health   Financial Resource Strain: Not on file  Food Insecurity: Not on file  Transportation Needs: Not on file  Physical Activity: Not on file  Stress: Not on file  Social Connections: Not on file   Review of systems General: negative for malaise, night sweats, fever, chills, weight loss Neck: Negative for lumps, goiter, pain and significant neck swelling Resp: Negative for cough, wheezing, dyspnea at rest CV: Negative  for chest pain, leg swelling, palpitations, orthopnea GI: denies melena, hematochezia, nausea, vomiting, diarrhea, dysphagia, odyonophagia, early satiety or unintentional weight loss. +constipation +lower abdominal pain MSK: Negative for joint pain or swelling, back pain, and muscle pain. Derm: Negative for itching or rash Psych: Denies depression, anxiety, memory loss, confusion. No homicidal or suicidal ideation.  Heme: Negative for prolonged bleeding, bruising easily, and swollen nodes. Endocrine: Negative for cold or heat intolerance, polyuria, polydipsia and goiter. Neuro: negative for tremor, gait imbalance, syncope and seizures. The remainder of the review of systems is noncontributory.  Physical Exam: BP 128/81 (BP Location: Left Arm, Patient Position: Sitting, Cuff Size: Large)   Pulse 72   Temp 97.6 F (36.4 C) (Oral)   Ht '5\' 4"'$  (1.626 m)   Wt 232 lb 3.2 oz (105.3 kg)   BMI 39.86 kg/m  General:   Alert and oriented. No distress noted. Pleasant and cooperative.  Head:  Normocephalic and atraumatic. Eyes:  Conjuctiva clear without scleral icterus. Mouth:  Oral mucosa pink and moist. Good dentition. No lesions. Heart: Normal rate and rhythm, s1 and s2 heart sounds present.  Lungs: Clear lung sounds in all lobes. Respirations equal and unlabored. Abdomen:  +BS, soft, non-tender and non-distended. No rebound or guarding. No HSM or masses noted. Derm: No palmar erythema or jaundice Msk:  Symmetrical without gross deformities. Normal posture. Extremities:  Without edema. Neurologic:  Alert and  oriented x4 Psych:  Alert and cooperative. Normal mood and affect.  Invalid input(s): 6 MONTHS   ASSESSMENT: Grace Delacruz is a 76 y.o. female presenting today as a new patient for constipation.  sudden onset of constipation about 6 months ago with some associated abdominal pain that improves with a BM. Can go 3-4 days without a BM unless she takes a  laxative which will elicit a  "large blow out." Notably, she reports no changes in medications or any other obvious precipitating factors. She does report that recent thyroid testing revealed slightly low thyroid function but she is unsure of exact value. She has had partial thyroidectomy in the past and is maintained on synthroid, though no dose change was made with last thyroid function result. I discussed making sure that she is staying well hydrated with plenty of water, diet high in fruits, veggies and whole grains. At this time she is presenting with no alarm symptoms. Will obtain most recent labs for review as I suspect thyroid function could be contributing to her constipation. Will start trulance '3mg'$  daily, samples provided. Will Consider endoscopic evaluation of change in stool habits if labs do not indicate that there would be significant influence of thyroid function as etiology of her constipation and/or if no improvement with dietary changes and introduction of trulance.    PLAN:  Increase water intake 2. Diet high in fruits, veggies and whole grains 3. Samples of trulance '3mg'$  daily 4. Obtain recent labs 5. Consider colonoscopy if labs unremarkable for contributory influence of constipation and/or constipation does not improve with dietary changes/trulance  All questions were answered, patient verbalized understanding and is in agreement with plan as outlined above.    Follow Up: 3 months  Edra Riccardi L. Alver Sorrow, MSN, APRN, AGNP-C Adult-Gerontology Nurse Practitioner [facility]

## 2022-03-29 ENCOUNTER — Telehealth (INDEPENDENT_AMBULATORY_CARE_PROVIDER_SITE_OTHER): Payer: Self-pay | Admitting: *Deleted

## 2022-03-29 NOTE — Telephone Encounter (Signed)
Patient left voicemail that she was seen this week and given samples ( trulance) and wanted a call back because med is not working. Left message to return call to get more information.

## 2022-04-01 NOTE — Telephone Encounter (Signed)
Within 2 hours of taking trulance it was giving her diarrhea. Also asking if Vikki Ports got thyroid test from pcp at [facility].

## 2022-04-02 NOTE — Telephone Encounter (Signed)
Discussed with patient. She said on the 2nd day she had more than 3 epidsodes of diarrhea. She wants to try the miralax and she wrote down the instructions. I called [facility] and was told they would fax over labs. Await labs.

## 2022-04-04 NOTE — Telephone Encounter (Signed)
Tsh results on your desk for review.

## 2022-04-04 NOTE — Telephone Encounter (Signed)
I called and left a message asked that she please return call to the office.

## 2022-04-08 NOTE — Telephone Encounter (Signed)
Called and discussed with patient per [facility] - her TSH is quite low, within normal range, but just barely, I suspect that this could be contributing to her diarrhea as the lower the TSH, the higher the thyroid is functioning, sometimes if thyroid is overactive this can cause diarrhea. She may need to discuss with PCP or whomever manages her thyroid medication that she is having diarrhea as they may need to adjust her medication.   She verbalized understanding of all.

## 2022-05-15 ENCOUNTER — Telehealth: Payer: Self-pay | Admitting: Cardiology

## 2022-05-15 MED ORDER — AMLODIPINE BESYLATE 2.5 MG PO TABS: 2.5000 mg | ORAL_TABLET | Freq: Every day | ORAL | 6 refills | Status: DC

## 2022-05-15 NOTE — Telephone Encounter (Signed)
Pt c/o BP issue: STAT if pt c/o blurred vision, one-sided weakness or slurred speech  1. What are your last 5 BP readings? Last night 206/104, this morning 187/100  2. Are you having any other symptoms (ex. Dizziness, headache, blurred vision, passed out)? last night pounding headache, dizziness, and blurred vision, mild headache now  3. What is your BP issue? Patient states he BP has been very high and she had a pounding headache last night. She says she was also dizzy and had blurred vision.

## 2022-05-15 NOTE — Telephone Encounter (Signed)
Patient notified and verbalized understanding.  Nurse BP check scheduled for 05/29/2022 at 9:00 am in [place] office.  New medication sent to [facility] now.

## 2022-05-15 NOTE — Telephone Encounter (Signed)
Spoke with patient - states that she had not been checking her BP on the regular lately.  The only reason she checked last night is because she started getting headache, dizziness and felt like her pressure was going up.  She had been sitting still watching TV - BP was 206/104  75 at that time.  BP around midnight was 146/78  86.  This morning her BP was 183/100  72.  No c/o chest pain, sob, or palpitations.  Reviewed medications and all medications still the same & no new life stressors.  Did see her pcp in May, but she was doing good then with no problems.  Next OV is scheduled for 07/31/2022 for her 1 year visit.

## 2022-05-29 ENCOUNTER — Encounter: Payer: Self-pay | Admitting: *Deleted

## 2022-05-29 ENCOUNTER — Ambulatory Visit (INDEPENDENT_AMBULATORY_CARE_PROVIDER_SITE_OTHER): Payer: Medicare Other | Admitting: *Deleted

## 2022-05-29 VITALS — BP 126/77 | HR 72 | Ht 65.0 in | Wt 238.4 lb

## 2022-05-29 DIAGNOSIS — I10 Essential (primary) hypertension: Secondary | ICD-10-CM | POA: Diagnosis not present

## 2022-05-29 NOTE — Progress Notes (Signed)
Advised to have home BP monitor check for accuracy during visit.

## 2022-05-29 NOTE — Progress Notes (Signed)
Presents for nurse visit to have BP checked per recent phone note for elevated BP's and starting amlodipine 2.5 mg. Reports taking all medications as prescribed without missed doses or side effects. Reports dizziness and headache since 05/14/2022. Denies chest pain or sob. Medications reviewed. Vitals done including orthostatic BP's and sent to provider for review.

## 2022-05-29 NOTE — Patient Instructions (Addendum)
Medication Instructions:  Your physician recommends that you continue on your current medications as directed. Please refer to the Current Medication list given to you today.  Labwork: none  Testing/Procedures: none  Follow-Up: Your physician recommends that you schedule a follow-up appointment in: as planned  Any Other Special Instructions Will Be Listed Below (If Applicable). Your physician has requested that you regularly monitor and record your blood pressure readings at home. Please use the same machine at the same time of day, same arm to check your readings and record them to bring to your follow-up visit. Please have your home blood pressure monitor checked for accuracy  If you need a refill on your cardiac medications before your next appointment, please call your pharmacy.

## 2022-06-12 ENCOUNTER — Encounter: Payer: Self-pay | Admitting: Family Medicine

## 2022-06-12 ENCOUNTER — Ambulatory Visit: Payer: Medicare Other | Admitting: Family Medicine

## 2022-06-12 VITALS — BP 128/66 | HR 69 | Ht 65.0 in | Wt 233.0 lb

## 2022-06-12 DIAGNOSIS — H81399 Other peripheral vertigo, unspecified ear: Secondary | ICD-10-CM

## 2022-06-12 DIAGNOSIS — G629 Polyneuropathy, unspecified: Secondary | ICD-10-CM | POA: Diagnosis not present

## 2022-06-12 DIAGNOSIS — G4486 Cervicogenic headache: Secondary | ICD-10-CM

## 2022-06-12 MED ORDER — GABAPENTIN 300 MG PO CAPS: 300.0000 mg | ORAL_CAPSULE | Freq: Two times a day (BID) | ORAL | 3 refills | Status: DC

## 2022-06-12 NOTE — Progress Notes (Signed)
Chief Complaint  Patient presents with   Follow-up    Rm 2, w husband. Here for 6 month f/u for polyneuropathy. Pt reports more dizziness when waking up first thing in the morning but can happen during the day. Sleeping on her r side helps w the dizziness. Has had high BP readings and f/u w cardiologist and is taking amlodipine.       HISTORY OF PRESENT ILLNESS:  06/12/22 ALL:  Kehaulani returns for follow up for vertigo, headaches and foot numbness. She was unable to tolerate imipramine at last visit 11/2021 and was switched to gabapentin. She is taking '600mg'$  daily at bedtime. She reports headaches are better. Neuropathy is stable. She does endorse more dizziness. She reports dizziness is worse in the mornings. She feels that her gait is off balance. She does report worsening with movement of head side to side. She feels dizziness can be worse in the mornings. Meclizine does help. She has tried the Epley maneuver that has also helped.   12/17/2021 ALL: MAIRELY FOXWORTH is a 76 y.o. female here today for follow up for vertigo, headaches, and foot numbness.   She was last seen by Dr Felecia Shelling 07/2021 and started on imipramine '10mg'$  QHS for headaches, dysesthesias, nocturia and insomnia. She feels that it has helped headaches but not certain it has helped with neuropathy discomfort. She continues to have sharp pain in feet, especially at night. She denies changes to gait. She denies falls. She continues to have difficulty getting to and staying asleep. She has noted significant constipation over the past 2 months. Vertigo is stable.   HISTORY (copied from Dr Garth Bigness previous note)  Tomi Grandpre is a 69 y.o. woman with  vertigo, headaches and gait disturbance and foot numbness   UPDATE10/20/2022: The MRI of the brain showed atrophy and chronic microvascular ischemic changes.  MRI of the cervical spine showed a normal spinal cord.  No spinal stenosis.  She has mild DJD.  Nothing was seen on either scan  to explain her gait issues.  She has painful numbness in her fingers and toes.    She has NIDDM (since 2007).  She notes the numbness more when she is resting.  At times it can become very painful and other times it seems milder   At the last visit, we did an occipital nerve block/splenius capitus TPI for occipital neuralgia.  She felt she got 5 to 6 weeks of benefit and then symptoms have slowly returned but are not as bad now as they were at the last visit   She has lost 40 pounds on Ozempic for DM.   She has had intermittent vertigo since 2012 but notes several episodes of  lightheadedness this year. Usually she has been standing a while, frst time while shopping.   She feels her balance has gradually worsened.    She fell twice the last 2 months, once going down steps and once tripping over a small step going into a restaurant.    Onfall she broke her humerus.    She denies weakness in her legs.    She needs to hold the bannister on stairs (has x many years).     She saw ENT and was told hearing was fine.    She had VNG at [facility] c/w benign paroxysmal positional vertigo.    Epley maneuvers have helped.      She has urinary frequency and nocturia x 3.     Vascular risks:  She has well controlled essential hypertension.   Never smoked.   She has Type 2 NIDDM > 10 years.     Images personally reviewed.   MRI brain 06/03/2019 showed age-appropriate mild generalized cortical atrophy, scattered T2/FLAIR hyperintense foci consistent with mild chronic microvascular ischemic change and an arachnoid cyst in the left middle fossa.  There were no acute findings.  Vertebral arteries are diminutive and the left terminates as the left posterior inferior cerebellar artery.  Posterior communicating arteries are present.   MRI [facility] 05/23/2021 showed  The internal auditory canals appear normal.     Scattered T2/FLAIR hyperintense foci in the hemispheres consistent with mild to moderate chronic microvascular ischemic  change, unchanged compared to the previous MRI.   Mild generalized cortical atrophy, typical for age..   No acute findings.  Normal enhancement pattern.   MRI cervical spine 05/23/2021 showed    Mild degenerative changes at C3-C4 through C6-C7 that do not lead to nerve root compression or spinal stenosis.   The spinal cord is normal signal..   Asymmetric thyroid gland.  There appears to be surgical resection of the right hemithyroid and there is heterogenous signal in the left hemithyroid.   LABS: 05/10/2021:    B12, SPEP/IEF, ssa/ssb, RF were normal    REVIEW OF SYSTEMS: Out of a complete 14 system review of symptoms, the patient complains only of the following symptoms, dizziness, numbness and sharp pains of feet, headaches, dizziness and all other reviewed systems are negative.   ALLERGIES: Allergies  Allergen Reactions   Ephedrine Palpitations    Heart rate fast-   Hibiclens [Chlorhexidine Gluconate] Rash    SKIN REDNESS AND BURNING SENSATION    NO CHG   Keflex [Cephalexin] Itching and Rash     HOME MEDICATIONS: Outpatient Medications Prior to Visit  Medication Sig Dispense Refill   amLODipine (NORVASC) 2.5 MG tablet Take 1 tablet (2.5 mg total) by mouth daily. 30 tablet 6   aspirin EC 81 MG tablet Take 81 mg by mouth at bedtime.      Coenzyme Q10 (COQ-10) 100 MG CAPS Take 1 capsule by mouth daily.     docusate sodium (COLACE) 100 MG capsule Take 200 mg by mouth at bedtime.     FLUoxetine (PROZAC) 20 MG capsule Take 20 mg by mouth every morning.      furosemide (LASIX) 40 MG tablet Take 40 mg by mouth daily.     ibuprofen (ADVIL,MOTRIN) 200 MG tablet Take 400 mg by mouth daily.      Krill Oil 500 MG CAPS Take 500 mg by mouth daily.     levothyroxine (SYNTHROID, LEVOTHROID) 100 MCG tablet Take 100 mcg by mouth daily before breakfast.      loratadine (CLARITIN) 10 MG tablet Take 10 mg by mouth daily.     losartan (COZAAR) 100 MG tablet Take 1 tablet (100 mg total) by mouth every  evening.     meclizine (ANTIVERT) 25 MG tablet Take 12.5 mg by mouth at bedtime as needed.     metFORMIN (GLUCOPHAGE-XR) 500 MG 24 hr tablet Take 500 mg by mouth daily.     metoprolol succinate (TOPROL-XL) 100 MG 24 hr tablet Take 100 mg by mouth every morning. Take with or immediately following a meal.     Multiple Vitamin (MULTIVITAMIN) tablet Take 1 tablet by mouth daily.     omeprazole (PRILOSEC) 20 MG capsule Take 20 mg by mouth daily before breakfast.     OZEMPIC, 0.25 OR 0.5  MG/DOSE, 2 MG/1.5ML SOPN Inject 1 mL into the skin once a week.     simvastatin (ZOCOR) 80 MG tablet Take 1 tablet by mouth at bedtime.     triamcinolone cream (KENALOG) 0.5 % Apply 1 application topically 3 (three) times daily as needed. For rash/irritation.     vitamin C (ASCORBIC ACID) 500 MG tablet Take 500 mg by mouth daily.     Vitamin D, Cholecalciferol, 1000 UNITS CAPS Take 1,000 mg by mouth 2 (two) times daily.     gabapentin (NEURONTIN) 300 MG capsule Take 1 capsule (300 mg total) by mouth 2 (two) times daily. 60 capsule 8   No facility-administered medications prior to visit.     PAST MEDICAL HISTORY: Past Medical History:  Diagnosis Date   Anxiety    Depression    DJD (degenerative joint disease)    Esophageal reflux    Essential hypertension    Fibrocystic breast disease    Hypothyroidism    Left bundle branch block    Transient - possibly rate related    Menopause    Osteoarthritis    PONV (postoperative nausea and vomiting)    pt. would like to have a scopolamine patch   Restless leg syndrome    Scoliosis    Type 2 diabetes mellitus ([place])      PAST SURGICAL HISTORY: Past Surgical History:  Procedure Laterality Date   BREAST BIOPSY     lt x3   CARPOMETACARPEL SUSPENSION PLASTY Left 03/08/2014   Procedure: LEFT THUMB CARPOMETACARPEL ([place]) SUSPENSION PLASTY WITH TRAPEZIUM EXCISION;  Surgeon: [name]., MD;  Location: [place];  Service: Orthopedics;   Laterality: Left;   CATARACT EXTRACTION Bilateral    CESAREAN SECTION     CHOLECYSTECTOMY     COLONOSCOPY  03/26/2007   RMR:  Diffuse changes of proctocolitis as described above diminutive rectal polyp, cold biopsy/removed, pedunculated polyp of the splenic flexure hot snare removed.  Segmental biopsies the colon taken of the ileocecal valve ulcer biopsied normal terminal ileum   COLONOSCOPY N/A 01/05/2014   Procedure: COLONOSCOPY;  Surgeon: Rogene Houston, MD;  [facility];  Service: Endoscopy;  Laterality: N/A;  830   EYE SURGERY     GASTROCNEMIUS RECESSION Right 08/07/2016   Procedure: GASTROCNEMIUS RECESSION;  Surgeon: Newt Minion, MD;  Location: [place] [facility];  Service: Orthopedics;  Laterality: Right;   Joint fusion-foot     x2 LEFT FOOT   KNEE ARTHROPLASTY  2001   Right   KNEE ARTHROPLASTY  2002   Left   LEFT HEART CATH AND CORONARY ANGIOGRAPHY N/A 06/18/2018   Procedure: LEFT HEART CATH AND CORONARY ANGIOGRAPHY;  Surgeon: Burnell Blanks, MD;  Location: [place] [facility];  Service: Cardiovascular;  Laterality: N/A;   Left leg vein resection     RETINAL DETACHMENT SURGERY Left 02/25/2022   done in [place]   3/4   TONSILLECTOMY     TUBAL LIGATION     WEIL OSTEOTOMY Right 08/07/2016   Procedure: WEIL OSTEOTOMY 2nd and 3rd Metatarsal Right Foot;  Surgeon: Newt Minion, MD;  Location: [place];  Service: Orthopedics;  Laterality: Right;   WEIL OSTEOTOMY Left 12/25/2016   Procedure: 2nd Toe Proximal Interphalangeal Resection, Weil Osteotomy 2nd and 3rd Metatarsal Left Foot;  Surgeon: Newt Minion, MD;  Location: [place];  Service: Orthopedics;  Laterality: Left;  FAMILY HISTORY: Family History  Problem Relation Age of Onset   Heart failure Father        Died with pneumonia   Hypertension Father    Arthritis Father    Diabetes Father    Hypertension Mother    Asthma Paternal Grandfather    Emphysema  Paternal Grandfather      SOCIAL HISTORY: Social History   Socioeconomic History   Marital status: Married    Spouse name: Not on file   Number of children: 1   Years of education: Not on file   Highest education level: Not on file  Occupational History   Occupation: retired  Tobacco Use   Smoking status: Never   Smokeless tobacco: Never  Vaping Use   Vaping Use: Never used  Substance and Sexual Activity   Alcohol use: Yes    Alcohol/week: 0.0 standard drinks of alcohol    Comment: rare-- glass of wine at christmas   Drug use: No   Sexual activity: Not on file  Other Topics Concern   Not on file  Social History Narrative   Not on file   Social Determinants of Health   Financial Resource Strain: Not on file  Food Insecurity: Not on file  Transportation Needs: Not on file  Physical Activity: Not on file  Stress: Not on file  Social Connections: Not on file  Intimate Partner Violence: Not on file     PHYSICAL EXAM  Vitals:   06/12/22 1114  BP: 128/66  Pulse: 69  Weight: 233 lb (105.7 kg)  Height: '5\' 5"'$  (1.651 m)    Body mass index is 38.77 kg/m.  Generalized: Well developed, in no acute distress  Cardiology: normal rate and rhythm, no murmur auscultated  Respiratory: clear to auscultation bilaterally    Neurological examination  Mentation: Alert oriented to time, place, history taking. Follows all commands speech and language fluent Cranial nerve II-XII: Pupils were equal round reactive to light. Extraocular movements were full, visual field were full on confrontational test. Facial sensation and strength were normal. Head turning and shoulder shrug  were normal and symmetric. Motor: The motor testing reveals 5 over 5 strength of all 4 extremities. Good symmetric motor tone is noted throughout.  Sensory: Sensory testing is intact to soft touch on all 4 extremities. No evidence of extinction is noted.  Gait and station: Gait is normal.    DIAGNOSTIC  DATA (LABS, IMAGING, TESTING) - I reviewed patient records, labs, notes, testing and imaging myself where available.  Lab Results  Component Value Date   WBC 7.2 06/12/2018   HGB 14.4 06/12/2018   HCT 42.6 06/12/2018   MCV 90.1 06/12/2018   PLT 223 06/12/2018      Component Value Date/Time   NA 142 06/12/2018 1151   K 3.9 06/12/2018 1151   CL 105 06/12/2018 1151   CO2 29 06/12/2018 1151   GLUCOSE 157 (H) 06/12/2018 1151   BUN 17 06/12/2018 1151   CREATININE 1.17 (H) 06/12/2018 1151   CALCIUM 9.7 06/12/2018 1151   PROT 6.6 05/10/2021 1124   GFRNONAA 46 (L) 06/12/2018 1151   GFRAA 53 (L) 06/12/2018 1151   No results found for: "CHOL", "HDL", "LDLCALC", "LDLDIRECT", "TRIG", "CHOLHDL" Lab Results  Component Value Date   HGBA1C 6.6 (H) 12/20/2016   Lab Results  Component Value Date   FOYDXAJO87 867 05/10/2021   No results found for: "TSH"      No data to display  No data to display           ASSESSMENT AND PLAN  76 y.o. year old female  has a past medical history of Anxiety, Depression, DJD (degenerative joint disease), Esophageal reflux, Essential hypertension, Fibrocystic breast disease, Hypothyroidism, Left bundle branch block, Menopause, Osteoarthritis, PONV (postoperative nausea and vomiting), Restless leg syndrome, Scoliosis, and Type 2 diabetes mellitus ([place]). here with    Polyneuropathy  Other peripheral vertigo, unspecified ear - Plan: Ambulatory referral to Physical Therapy  Cervicogenic headache  Thia reports that headaches and neuropathy are improved and stable. We will continue gabapentin up to '600mg'$  daily. Side effects reviewed. She may consider taking '300mg'$  early afternoon and '300mg'$  at bedtime to see if this helps with dizziness. I will send her to PT for vestibular therapy. She will continue close follow up with PCP and cardiology. She will focus on healthy lifestyle habits and sleep hygiene. Close follow up with PCP advised. She  will return to see me in 6 months, sooner if needed.    Orders Placed This Encounter  Procedures   Ambulatory referral to Physical Therapy    Referral Priority:   Routine    Referral Type:   Physical Medicine    Referral Reason:   Specialty Services Required    Requested Specialty:   Physical Therapy    Number of Visits Requested:   1     Meds ordered this encounter  Medications   gabapentin (NEURONTIN) 300 MG capsule    Sig: Take 1 capsule (300 mg total) by mouth 2 (two) times daily.    Dispense:  180 capsule    Refill:  3    Refill requested by [facility] by fax    Order Specific Question:   Supervising Provider    Answer:   Melvenia Beam [[id number]]      Debbora Presto, MSN, FNP-C 06/12/2022, 12:08 PM  [facility] [address] [place],  [address] [phone]

## 2022-06-12 NOTE — Patient Instructions (Signed)
Below is our plan:  We will continue gabapentin '600mg'$  daily. You may consider splitting the dose to '300mg'$  in the afternoons and '300mg'$  at bedtime. We will send you to vestibular PT. Try the Epley exercises at home.   Please make sure you are staying well hydrated. I recommend 50-60 ounces daily. Well balanced diet and regular exercise encouraged. Consistent sleep schedule with 6-8 hours recommended.   Please continue follow up with care team as directed.   Follow up with me in 1 year, sooner if needed   You may receive a survey regarding today's visit. I encourage you to leave honest feed back as I do use this information to improve patient care. Thank you for seeing me today!

## 2022-06-13 ENCOUNTER — Telehealth: Payer: Self-pay | Admitting: Family Medicine

## 2022-06-13 NOTE — Telephone Encounter (Signed)
[facility] [address] [place], [place] [address]  Phone: [phone] Fax: [phone]

## 2022-06-17 ENCOUNTER — Ambulatory Visit: Payer: Medicare Other | Admitting: Family Medicine

## 2022-06-25 NOTE — Telephone Encounter (Signed)
The fax number was not working, referral was sent by email to [email or web address]

## 2022-06-27 ENCOUNTER — Ambulatory Visit (INDEPENDENT_AMBULATORY_CARE_PROVIDER_SITE_OTHER): Payer: Medicare Other | Admitting: Gastroenterology

## 2022-07-31 ENCOUNTER — Encounter: Payer: Self-pay | Admitting: Cardiology

## 2022-07-31 ENCOUNTER — Ambulatory Visit: Payer: Medicare Other | Attending: Cardiology | Admitting: Cardiology

## 2022-07-31 VITALS — BP 104/62 | HR 71 | Ht 64.0 in | Wt 235.6 lb

## 2022-07-31 DIAGNOSIS — I10 Essential (primary) hypertension: Secondary | ICD-10-CM

## 2022-07-31 DIAGNOSIS — Z8679 Personal history of other diseases of the circulatory system: Secondary | ICD-10-CM

## 2022-07-31 DIAGNOSIS — I493 Ventricular premature depolarization: Secondary | ICD-10-CM

## 2022-07-31 NOTE — Patient Instructions (Addendum)

## 2022-07-31 NOTE — Progress Notes (Signed)
Cardiology Office Note  Date: 07/31/2022   ID: Grace Delacruz, DOB 12/18/1945, MRN [id number]  PCP:  Esterbrook Nation, MD  Cardiologist:  Rozann Lesches, MD Electrophysiologist:  None   Chief Complaint  Patient presents with   Cardiac follow-up    History of Present Illness: Grace Delacruz is a 76 y.o. female last seen in May 2022.  She is here for a routine visit.  Reports no substantial change in palpitations.  Currently doing PT to help improve balance and she is considering joining the [facility] thereafter.  Does not report any recent falls.  She is tolerating addition of low-dose Norvasc, blood pressure is low normal today and she is asymptomatic.  Continue to track blood pressure at home.  I personally reviewed her ECG which shows sinus rhythm with low voltage.  I also reviewed the remainder of her medications.  Past Medical History:  Diagnosis Date   Anxiety    Depression    DJD (degenerative joint disease)    Esophageal reflux    Essential hypertension    Fibrocystic breast disease    Hypothyroidism    Left bundle branch block    Transient - possibly rate related    Menopause    Osteoarthritis    PONV (postoperative nausea and vomiting)    pt. would like to have a scopolamine patch   Restless leg syndrome    Scoliosis    Type 2 diabetes mellitus ([place])     Past Surgical History:  Procedure Laterality Date   BREAST BIOPSY     lt x3   CARPOMETACARPEL SUSPENSION PLASTY Left 03/08/2014   Procedure: LEFT THUMB CARPOMETACARPEL ([place]) SUSPENSION PLASTY WITH TRAPEZIUM EXCISION;  Surgeon: [name]., MD;  Location: [place];  Service: Orthopedics;  Laterality: Left;   CATARACT EXTRACTION Bilateral    CESAREAN SECTION     CHOLECYSTECTOMY     COLONOSCOPY  03/26/2007   RMR:  Diffuse changes of proctocolitis as described above diminutive rectal polyp, cold biopsy/removed, pedunculated polyp of the splenic flexure hot snare removed.   Segmental biopsies the colon taken of the ileocecal valve ulcer biopsied normal terminal ileum   COLONOSCOPY N/A 01/05/2014   Procedure: COLONOSCOPY;  Surgeon: Rogene Houston, MD;  [facility];  Service: Endoscopy;  Laterality: N/A;  830   EYE SURGERY     GASTROCNEMIUS RECESSION Right 08/07/2016   Procedure: GASTROCNEMIUS RECESSION;  Surgeon: Newt Minion, MD;  Location: [facility];  Service: Orthopedics;  Laterality: Right;   Joint fusion-foot     x2 LEFT FOOT   KNEE ARTHROPLASTY  2001   Right   KNEE ARTHROPLASTY  2002   Left   LEFT HEART CATH AND CORONARY ANGIOGRAPHY N/A 06/18/2018   Procedure: LEFT HEART CATH AND CORONARY ANGIOGRAPHY;  Surgeon: Burnell Blanks, MD;  Location: [place] [facility];  Service: Cardiovascular;  Laterality: N/A;   Left leg vein resection     RETINAL DETACHMENT SURGERY Left 02/25/2022   done in [place]   3/4   TONSILLECTOMY     TUBAL LIGATION     WEIL OSTEOTOMY Right 08/07/2016   Procedure: WEIL OSTEOTOMY 2nd and 3rd Metatarsal Right Foot;  Surgeon: Newt Minion, MD;  Location: [place];  Service: Orthopedics;  Laterality: Right;   WEIL OSTEOTOMY Left 12/25/2016   Procedure: 2nd Toe Proximal Interphalangeal Resection, Weil Osteotomy 2nd  and 3rd Metatarsal Left Foot;  Surgeon: Newt Minion, MD;  Location: [place];  Service: Orthopedics;  Laterality: Left;    Current Outpatient Medications  Medication Sig Dispense Refill   amLODipine (NORVASC) 2.5 MG tablet Take 1 tablet (2.5 mg total) by mouth daily. 30 tablet 6   aspirin EC 81 MG tablet Take 81 mg by mouth at bedtime.      Coenzyme Q10 (COQ-10) 100 MG CAPS Take 1 capsule by mouth daily.     docusate sodium (COLACE) 100 MG capsule Take 200 mg by mouth at bedtime.     FLUoxetine (PROZAC) 20 MG capsule Take 20 mg by mouth every morning.      furosemide (LASIX) 40 MG tablet Take 40 mg by mouth daily.     gabapentin (NEURONTIN) 300 MG capsule  Take 1 capsule (300 mg total) by mouth 2 (two) times daily. 180 capsule 3   ibuprofen (ADVIL,MOTRIN) 200 MG tablet Take 400 mg by mouth daily.      Krill Oil 500 MG CAPS Take 500 mg by mouth daily.     levothyroxine (SYNTHROID, LEVOTHROID) 100 MCG tablet Take 100 mcg by mouth daily before breakfast.      loratadine (CLARITIN) 10 MG tablet Take 10 mg by mouth daily.     losartan (COZAAR) 100 MG tablet Take 1 tablet (100 mg total) by mouth every evening.     meclizine (ANTIVERT) 25 MG tablet Take 12.5 mg by mouth at bedtime as needed.     metFORMIN (GLUCOPHAGE-XR) 500 MG 24 hr tablet Take 500 mg by mouth daily.     metoprolol succinate (TOPROL-XL) 100 MG 24 hr tablet Take 100 mg by mouth every morning. Take with or immediately following a meal.     Multiple Vitamin (MULTIVITAMIN) tablet Take 1 tablet by mouth daily.     omeprazole (PRILOSEC) 20 MG capsule Take 20 mg by mouth daily before breakfast.     OZEMPIC, 0.25 OR 0.5 MG/DOSE, 2 MG/1.5ML SOPN Inject 1 mL into the skin once a week.     potassium chloride (KLOR-CON) 10 MEQ tablet Take 10 mEq by mouth daily.     simvastatin (ZOCOR) 80 MG tablet Take 1 tablet by mouth at bedtime.     triamcinolone cream (KENALOG) 0.5 % Apply 1 application topically 3 (three) times daily as needed. For rash/irritation.     vitamin C (ASCORBIC ACID) 500 MG tablet Take 500 mg by mouth daily.     Vitamin D, Cholecalciferol, 1000 UNITS CAPS Take 1,000 mg by mouth 2 (two) times daily.     No current facility-administered medications for this visit.   Allergies:  Ephedrine, Hibiclens [chlorhexidine gluconate], and Keflex [cephalexin]   ROS: No orthopnea or PND.  Physical Exam: VS:  BP 104/62   Pulse 71   Ht '5\' 4"'$  (1.626 m)   Wt 235 lb 9.6 oz (106.9 kg)   SpO2 97%   BMI 40.44 kg/m , BMI Body mass index is 40.44 kg/m.  Wt Readings from Last 3 Encounters:  07/31/22 235 lb 9.6 oz (106.9 kg)  06/12/22 233 lb (105.7 kg)  05/29/22 238 lb 6.4 oz (108.1 kg)     General: Patient appears comfortable at rest. HEENT: Conjunctiva and lids normal. Neck: Supple, no elevated JVP or carotid bruits. Lungs: Clear to auscultation, nonlabored breathing at rest. Cardiac: Regular rate and rhythm, no S3 or significant systolic murmur. Extremities: No pitting edema.  ECG:  An ECG dated 05/09/2020 was personally reviewed today and  demonstrated:  Sinus rhythm with left bundle branch block.  Recent Labwork:  January 2023: BUN 15, creatinine 1.01, potassium 3.5, AST 19, ALT 05 Mar 2022: TSH 0.55  Other Studies Reviewed Today:  Cardiac catheterization 06/18/2018: Mid RCA lesion is 20% stenosed. Prox LAD to Mid LAD lesion is 30% stenosed. Ost 1st Diag lesion is 20% stenosed.   1. Mild non-obstructive CAD 2. Mild elevation LVEDP   Recommendations: Medical management of CAD.    Echocardiogram 05/20/2018: Study Conclusions   - Left ventricle: The cavity size was normal. Wall thickness was   increased in a pattern of mild LVH. Systolic function was normal.   The estimated ejection fraction was in the range of 55% to 60%.   Wall motion was normal; there were no regional wall motion   abnormalities. Doppler parameters are consistent with abnormal   left ventricular relaxation (grade 1 diastolic dysfunction). - Aortic valve: Mildly calcified annulus. Trileaflet; mildly   calcified leaflets. - Mitral valve: Mildly calcified annulus. - Left atrium: The atrium was at the upper limits of normal in   size. - Right atrium: Central venous pressure (est): 3 mm Hg. - Atrial septum: No defect or patent foramen ovale was identified. - Tricuspid valve: There was trivial regurgitation. - Pulmonary arteries: PA peak pressure: 12 mm Hg (S). - Pericardium, extracardiac: There was no pericardial effusion.  Assessment and Plan:  1.  History of frequent PVCs and NSVT, doing very well on Toprol-XL without increase in palpitations, no syncope.  ECG reviewed today.  Previous  cardiac work-up includes mild CAD and normal LVEF.  2.  Essential hypertension, blood pressure low normal today on current regimen including Norvasc, Cozaar, and Toprol-XL.  3.  Intermittent left bundle branch block by ECG.  Medication Adjustments/Labs and Tests Ordered: Current medicines are reviewed at length with the patient today.  Concerns regarding medicines are outlined above.   Tests Ordered: Orders Placed This Encounter  Procedures   EKG 12-Lead    Medication Changes: No orders of the defined types were placed in this encounter.   Disposition:  Follow up  1 year, sooner if needed.  Signed, Satira Sark, MD, [facility] 07/31/2022 2:53 PM    [facility] [place], [place], [place] [address] Phone: [phone]; Fax: [phone]

## 2022-08-23 ENCOUNTER — Other Ambulatory Visit: Payer: Self-pay | Admitting: *Deleted

## 2022-08-23 MED ORDER — AMLODIPINE BESYLATE 2.5 MG PO TABS: 2.5000 mg | ORAL_TABLET | Freq: Every day | ORAL | 3 refills | Status: DC

## 2022-10-24 DIAGNOSIS — H43822 Vitreomacular adhesion, left eye: Secondary | ICD-10-CM | POA: Insufficient documentation

## 2022-10-24 DIAGNOSIS — E119 Type 2 diabetes mellitus without complications: Secondary | ICD-10-CM | POA: Insufficient documentation

## 2022-10-24 DIAGNOSIS — H35373 Puckering of macula, bilateral: Secondary | ICD-10-CM | POA: Insufficient documentation

## 2022-10-24 DIAGNOSIS — H35352 Cystoid macular degeneration, left eye: Secondary | ICD-10-CM | POA: Insufficient documentation

## 2023-01-29 ENCOUNTER — Other Ambulatory Visit: Payer: Self-pay | Admitting: *Deleted

## 2023-01-29 MED ORDER — AMLODIPINE BESYLATE 2.5 MG PO TABS: 2.5000 mg | ORAL_TABLET | Freq: Every day | ORAL | 3 refills | Status: DC

## 2023-03-31 DIAGNOSIS — E1165 Type 2 diabetes mellitus with hyperglycemia: Secondary | ICD-10-CM | POA: Diagnosis not present

## 2023-03-31 DIAGNOSIS — E1169 Type 2 diabetes mellitus with other specified complication: Secondary | ICD-10-CM | POA: Diagnosis not present

## 2023-03-31 DIAGNOSIS — I10 Essential (primary) hypertension: Secondary | ICD-10-CM | POA: Diagnosis not present

## 2023-03-31 DIAGNOSIS — E039 Hypothyroidism, unspecified: Secondary | ICD-10-CM | POA: Diagnosis not present

## 2023-03-31 DIAGNOSIS — E559 Vitamin D deficiency, unspecified: Secondary | ICD-10-CM | POA: Diagnosis not present

## 2023-04-08 ENCOUNTER — Other Ambulatory Visit: Payer: Self-pay | Admitting: *Deleted

## 2023-04-08 MED ORDER — GABAPENTIN 300 MG PO CAPS: 300.0000 mg | ORAL_CAPSULE | Freq: Two times a day (BID) | ORAL | 0 refills | Status: DC

## 2023-04-08 NOTE — Telephone Encounter (Signed)
Last seen on 06/12/22 per note "We will continue gabapentin 600mg  daily." Follow up scheduled on 06/11/23 Last filled on 01/29/23 #200 tablets (100 day supply)

## 2023-04-16 DIAGNOSIS — H353122 Nonexudative age-related macular degeneration, left eye, intermediate dry stage: Secondary | ICD-10-CM | POA: Diagnosis not present

## 2023-04-16 DIAGNOSIS — H524 Presbyopia: Secondary | ICD-10-CM | POA: Diagnosis not present

## 2023-04-20 DIAGNOSIS — E7800 Pure hypercholesterolemia, unspecified: Secondary | ICD-10-CM | POA: Diagnosis not present

## 2023-04-20 DIAGNOSIS — E1169 Type 2 diabetes mellitus with other specified complication: Secondary | ICD-10-CM | POA: Diagnosis not present

## 2023-04-22 DIAGNOSIS — E039 Hypothyroidism, unspecified: Secondary | ICD-10-CM | POA: Diagnosis not present

## 2023-04-22 DIAGNOSIS — M419 Scoliosis, unspecified: Secondary | ICD-10-CM | POA: Diagnosis not present

## 2023-04-22 DIAGNOSIS — R1012 Left upper quadrant pain: Secondary | ICD-10-CM | POA: Diagnosis not present

## 2023-04-22 DIAGNOSIS — R609 Edema, unspecified: Secondary | ICD-10-CM | POA: Diagnosis not present

## 2023-04-22 DIAGNOSIS — E1165 Type 2 diabetes mellitus with hyperglycemia: Secondary | ICD-10-CM | POA: Diagnosis not present

## 2023-04-22 DIAGNOSIS — E876 Hypokalemia: Secondary | ICD-10-CM | POA: Diagnosis not present

## 2023-04-22 DIAGNOSIS — Z23 Encounter for immunization: Secondary | ICD-10-CM | POA: Diagnosis not present

## 2023-04-22 DIAGNOSIS — I10 Essential (primary) hypertension: Secondary | ICD-10-CM | POA: Diagnosis not present

## 2023-04-23 DIAGNOSIS — M791 Myalgia, unspecified site: Secondary | ICD-10-CM | POA: Diagnosis not present

## 2023-04-23 DIAGNOSIS — E859 Amyloidosis, unspecified: Secondary | ICD-10-CM | POA: Diagnosis not present

## 2023-04-23 DIAGNOSIS — R5383 Other fatigue: Secondary | ICD-10-CM | POA: Diagnosis not present

## 2023-04-23 DIAGNOSIS — T3695XA Adverse effect of unspecified systemic antibiotic, initial encounter: Secondary | ICD-10-CM | POA: Diagnosis not present

## 2023-05-01 DIAGNOSIS — R109 Unspecified abdominal pain: Secondary | ICD-10-CM | POA: Diagnosis not present

## 2023-05-01 DIAGNOSIS — R160 Hepatomegaly, not elsewhere classified: Secondary | ICD-10-CM | POA: Diagnosis not present

## 2023-05-01 DIAGNOSIS — Z9049 Acquired absence of other specified parts of digestive tract: Secondary | ICD-10-CM | POA: Diagnosis not present

## 2023-05-01 DIAGNOSIS — N281 Cyst of kidney, acquired: Secondary | ICD-10-CM | POA: Diagnosis not present

## 2023-05-12 ENCOUNTER — Telehealth (INDEPENDENT_AMBULATORY_CARE_PROVIDER_SITE_OTHER): Payer: Self-pay | Admitting: *Deleted

## 2023-05-12 NOTE — Telephone Encounter (Signed)
Patient called in and canceled her apt for 05/13/23 and asked for her records to be sent to [facility] - I asked if she was transferring her GI care and she stated she was - records have been faxed

## 2023-05-13 ENCOUNTER — Encounter (INDEPENDENT_AMBULATORY_CARE_PROVIDER_SITE_OTHER): Payer: Medicare Other | Admitting: Gastroenterology

## 2023-05-14 NOTE — Telephone Encounter (Signed)
  Spoke with patient she has GI history, requested records for review had colonoscopy a few years ago no EGD.

## 2023-05-19 ENCOUNTER — Telehealth: Payer: Self-pay | Admitting: Internal Medicine

## 2023-05-19 NOTE — Telephone Encounter (Signed)
Hi Dr. Rhea Belton,  Supervising Provider: 05/19/23-AM  Patient called requesting a transfer of care over to [facility] because she has heard many greats things about the providers here at [facility]. She has GI history with [facility] and her last colonoscopy looks like it was done in 2015. Stated she would like to see a liver specialist and had her records sent to our office. They have been scanned into [employer] for you to review and advise on scheduling.   Thank you

## 2023-05-19 NOTE — Telephone Encounter (Signed)
Ok for an appt, please explain to the patient the wait to see me can be long She could see an APP 1st and likely sooner than an appt with me if she is interested

## 2023-05-20 ENCOUNTER — Encounter: Payer: Self-pay | Admitting: Internal Medicine

## 2023-05-20 NOTE — Telephone Encounter (Signed)
Called patient to advise no answer left voicemail.

## 2023-05-26 DIAGNOSIS — E876 Hypokalemia: Secondary | ICD-10-CM | POA: Diagnosis not present

## 2023-05-26 DIAGNOSIS — R1012 Left upper quadrant pain: Secondary | ICD-10-CM | POA: Diagnosis not present

## 2023-05-26 DIAGNOSIS — M419 Scoliosis, unspecified: Secondary | ICD-10-CM | POA: Diagnosis not present

## 2023-05-26 DIAGNOSIS — R609 Edema, unspecified: Secondary | ICD-10-CM | POA: Diagnosis not present

## 2023-05-26 DIAGNOSIS — I10 Essential (primary) hypertension: Secondary | ICD-10-CM | POA: Diagnosis not present

## 2023-05-26 DIAGNOSIS — E039 Hypothyroidism, unspecified: Secondary | ICD-10-CM | POA: Diagnosis not present

## 2023-05-26 DIAGNOSIS — E1165 Type 2 diabetes mellitus with hyperglycemia: Secondary | ICD-10-CM | POA: Diagnosis not present

## 2023-05-30 DIAGNOSIS — I517 Cardiomegaly: Secondary | ICD-10-CM | POA: Diagnosis not present

## 2023-05-30 DIAGNOSIS — I2510 Atherosclerotic heart disease of native coronary artery without angina pectoris: Secondary | ICD-10-CM | POA: Diagnosis not present

## 2023-05-30 DIAGNOSIS — R0902 Hypoxemia: Secondary | ICD-10-CM | POA: Diagnosis not present

## 2023-05-30 DIAGNOSIS — R1012 Left upper quadrant pain: Secondary | ICD-10-CM | POA: Diagnosis not present

## 2023-05-30 DIAGNOSIS — I700 Atherosclerosis of aorta: Secondary | ICD-10-CM | POA: Diagnosis not present

## 2023-05-30 DIAGNOSIS — R0602 Shortness of breath: Secondary | ICD-10-CM | POA: Diagnosis not present

## 2023-05-30 DIAGNOSIS — R918 Other nonspecific abnormal finding of lung field: Secondary | ICD-10-CM | POA: Diagnosis not present

## 2023-05-30 DIAGNOSIS — E041 Nontoxic single thyroid nodule: Secondary | ICD-10-CM | POA: Diagnosis not present

## 2023-06-06 DIAGNOSIS — E041 Nontoxic single thyroid nodule: Secondary | ICD-10-CM | POA: Diagnosis not present

## 2023-06-06 DIAGNOSIS — U071 COVID-19: Secondary | ICD-10-CM | POA: Diagnosis not present

## 2023-06-06 DIAGNOSIS — I4581 Long QT syndrome: Secondary | ICD-10-CM | POA: Diagnosis not present

## 2023-06-06 DIAGNOSIS — J984 Other disorders of lung: Secondary | ICD-10-CM | POA: Diagnosis not present

## 2023-06-06 DIAGNOSIS — R079 Chest pain, unspecified: Secondary | ICD-10-CM | POA: Diagnosis not present

## 2023-06-06 DIAGNOSIS — E8770 Fluid overload, unspecified: Secondary | ICD-10-CM | POA: Diagnosis not present

## 2023-06-06 DIAGNOSIS — R0902 Hypoxemia: Secondary | ICD-10-CM | POA: Diagnosis not present

## 2023-06-06 DIAGNOSIS — Z20822 Contact with and (suspected) exposure to covid-19: Secondary | ICD-10-CM | POA: Diagnosis not present

## 2023-06-06 DIAGNOSIS — Z452 Encounter for adjustment and management of vascular access device: Secondary | ICD-10-CM | POA: Diagnosis not present

## 2023-06-06 DIAGNOSIS — A419 Sepsis, unspecified organism: Secondary | ICD-10-CM | POA: Diagnosis not present

## 2023-06-06 DIAGNOSIS — I82612 Acute embolism and thrombosis of superficial veins of left upper extremity: Secondary | ICD-10-CM | POA: Diagnosis not present

## 2023-06-06 DIAGNOSIS — R918 Other nonspecific abnormal finding of lung field: Secondary | ICD-10-CM | POA: Diagnosis not present

## 2023-06-06 DIAGNOSIS — Z9981 Dependence on supplemental oxygen: Secondary | ICD-10-CM | POA: Diagnosis not present

## 2023-06-06 DIAGNOSIS — Z79899 Other long term (current) drug therapy: Secondary | ICD-10-CM | POA: Diagnosis not present

## 2023-06-06 DIAGNOSIS — Z7901 Long term (current) use of anticoagulants: Secondary | ICD-10-CM | POA: Diagnosis not present

## 2023-06-06 DIAGNOSIS — M79602 Pain in left arm: Secondary | ICD-10-CM | POA: Diagnosis not present

## 2023-06-06 DIAGNOSIS — R7989 Other specified abnormal findings of blood chemistry: Secondary | ICD-10-CM | POA: Diagnosis not present

## 2023-06-06 DIAGNOSIS — R0789 Other chest pain: Secondary | ICD-10-CM | POA: Diagnosis not present

## 2023-06-06 DIAGNOSIS — Z7984 Long term (current) use of oral hypoglycemic drugs: Secondary | ICD-10-CM | POA: Diagnosis not present

## 2023-06-06 DIAGNOSIS — R9431 Abnormal electrocardiogram [ECG] [EKG]: Secondary | ICD-10-CM | POA: Diagnosis not present

## 2023-06-06 DIAGNOSIS — I119 Hypertensive heart disease without heart failure: Secondary | ICD-10-CM | POA: Diagnosis not present

## 2023-06-06 DIAGNOSIS — Z794 Long term (current) use of insulin: Secondary | ICD-10-CM | POA: Diagnosis not present

## 2023-06-06 DIAGNOSIS — Z792 Long term (current) use of antibiotics: Secondary | ICD-10-CM | POA: Diagnosis not present

## 2023-06-06 DIAGNOSIS — R0602 Shortness of breath: Secondary | ICD-10-CM | POA: Diagnosis not present

## 2023-06-06 DIAGNOSIS — N179 Acute kidney failure, unspecified: Secondary | ICD-10-CM | POA: Diagnosis not present

## 2023-06-06 DIAGNOSIS — I10 Essential (primary) hypertension: Secondary | ICD-10-CM | POA: Diagnosis not present

## 2023-06-06 DIAGNOSIS — R6520 Severe sepsis without septic shock: Secondary | ICD-10-CM | POA: Diagnosis not present

## 2023-06-06 DIAGNOSIS — I3481 Nonrheumatic mitral (valve) annulus calcification: Secondary | ICD-10-CM | POA: Diagnosis not present

## 2023-06-06 DIAGNOSIS — I517 Cardiomegaly: Secondary | ICD-10-CM | POA: Diagnosis not present

## 2023-06-06 DIAGNOSIS — R6889 Other general symptoms and signs: Secondary | ICD-10-CM | POA: Diagnosis not present

## 2023-06-06 DIAGNOSIS — I2510 Atherosclerotic heart disease of native coronary artery without angina pectoris: Secondary | ICD-10-CM | POA: Diagnosis not present

## 2023-06-06 DIAGNOSIS — I499 Cardiac arrhythmia, unspecified: Secondary | ICD-10-CM | POA: Diagnosis not present

## 2023-06-06 DIAGNOSIS — Z743 Need for continuous supervision: Secondary | ICD-10-CM | POA: Diagnosis not present

## 2023-06-06 DIAGNOSIS — I509 Heart failure, unspecified: Secondary | ICD-10-CM | POA: Diagnosis not present

## 2023-06-06 DIAGNOSIS — I214 Non-ST elevation (NSTEMI) myocardial infarction: Secondary | ICD-10-CM | POA: Diagnosis not present

## 2023-06-06 DIAGNOSIS — J9601 Acute respiratory failure with hypoxia: Secondary | ICD-10-CM | POA: Diagnosis not present

## 2023-06-06 DIAGNOSIS — J1282 Pneumonia due to coronavirus disease 2019: Secondary | ICD-10-CM | POA: Diagnosis not present

## 2023-06-06 DIAGNOSIS — Z7985 Long-term (current) use of injectable non-insulin antidiabetic drugs: Secondary | ICD-10-CM | POA: Diagnosis not present

## 2023-06-06 DIAGNOSIS — N3000 Acute cystitis without hematuria: Secondary | ICD-10-CM | POA: Diagnosis not present

## 2023-06-06 DIAGNOSIS — Z7982 Long term (current) use of aspirin: Secondary | ICD-10-CM | POA: Diagnosis not present

## 2023-06-06 DIAGNOSIS — E119 Type 2 diabetes mellitus without complications: Secondary | ICD-10-CM | POA: Diagnosis not present

## 2023-06-06 DIAGNOSIS — I808 Phlebitis and thrombophlebitis of other sites: Secondary | ICD-10-CM | POA: Diagnosis not present

## 2023-06-06 DIAGNOSIS — I21A1 Myocardial infarction type 2: Secondary | ICD-10-CM | POA: Diagnosis not present

## 2023-06-06 DIAGNOSIS — I447 Left bundle-branch block, unspecified: Secondary | ICD-10-CM | POA: Diagnosis not present

## 2023-06-07 DIAGNOSIS — I10 Essential (primary) hypertension: Secondary | ICD-10-CM | POA: Insufficient documentation

## 2023-06-10 NOTE — Progress Notes (Deleted)
No chief complaint on file.    HISTORY OF PRESENT ILLNESS:  06/10/23 ALL:  Grace Delacruz returns for follow up for headaches, neuropathy and vertigo. We continued gabapentin up to 600mg  daily at last visit 05/2022. We also referred her to vestibular therapy. Since,   06/12/2022 ALL: Grace Delacruz returns for follow up for vertigo, headaches and foot numbness. She was unable to tolerate imipramine at last visit 11/2021 and was switched to gabapentin. She is taking 600mg  daily at bedtime. She reports headaches are better. Neuropathy is stable. She does endorse more dizziness. She reports dizziness is worse in the mornings. She feels that her gait is off balance. She does report worsening with movement of head side to side. She feels dizziness can be worse in the mornings. Meclizine does help. She has tried the Epley maneuver that has also helped.   12/17/2021 ALL: Grace Delacruz is a 77 y.o. female here today for follow up for vertigo, headaches, and foot numbness.   She was last seen by Dr Epimenio Foot 07/2021 and started on imipramine 10mg  QHS for headaches, dysesthesias, nocturia and insomnia. She feels that it has helped headaches but not certain it has helped with neuropathy discomfort. She continues to have sharp pain in feet, especially at night. She denies changes to gait. She denies falls. She continues to have difficulty getting to and staying asleep. She has noted significant constipation over the past 2 months. Vertigo is stable.   HISTORY (copied from Dr Bonnita Hollow previous note)  Grace Delacruz is a 77 y.o. woman with  vertigo, headaches and gait disturbance and foot numbness   UPDATE10/20/2022: The MRI of the brain showed atrophy and chronic microvascular ischemic changes.  MRI of the cervical spine showed a normal spinal cord.  No spinal stenosis.  She has mild DJD.  Nothing was seen on either scan to explain her gait issues.  She has painful numbness in her fingers and toes.    She has NIDDM (since  2007).  She notes the numbness more when she is resting.  At times it can become very painful and other times it seems milder   At the last visit, we did an occipital nerve block/splenius capitus TPI for occipital neuralgia.  She felt she got 5 to 6 weeks of benefit and then symptoms have slowly returned but are not as bad now as they were at the last visit   She has lost 40 pounds on Ozempic for DM.   She has had intermittent vertigo since 2012 but notes several episodes of  lightheadedness this year. Usually she has been standing a while, frst time while shopping.   She feels her balance has gradually worsened.    She fell twice the last 2 months, once going down steps and once tripping over a small step going into a restaurant.    Onfall she broke her humerus.    She denies weakness in her legs.    She needs to hold the bannister on stairs (has x many years).     She saw ENT and was told hearing was fine.    She had [facility] at [facility] c/w benign paroxysmal positional vertigo.    Epley maneuvers have helped.      She has urinary frequency and nocturia x 3.     Vascular risks:  She has well controlled essential hypertension.   Never smoked.   She has Type 2 NIDDM > 10 years.     Images personally reviewed.  MRI brain 06/03/2019 showed age-appropriate mild generalized cortical atrophy, scattered T2/FLAIR hyperintense foci consistent with mild chronic microvascular ischemic change and an arachnoid cyst in the left middle fossa.  There were no acute findings.  Vertebral arteries are diminutive and the left terminates as the left posterior inferior cerebellar artery.  Posterior communicating arteries are present.   MRI [facility] 05/23/2021 showed  The internal auditory canals appear normal.     Scattered T2/FLAIR hyperintense foci in the hemispheres consistent with mild to moderate chronic microvascular ischemic change, unchanged compared to the previous MRI.   Mild generalized cortical atrophy, typical for age..    No acute findings.  Normal enhancement pattern.   MRI cervical spine 05/23/2021 showed    Mild degenerative changes at C3-C4 through C6-C7 that do not lead to nerve root compression or spinal stenosis.   The spinal cord is normal signal..   Asymmetric thyroid gland.  There appears to be surgical resection of the right hemithyroid and there is heterogenous signal in the left hemithyroid.   LABS: 05/10/2021:    B12, SPEP/IEF, ssa/ssb, RF were normal    REVIEW OF SYSTEMS: Out of a complete 14 system review of symptoms, the patient complains only of the following symptoms, dizziness, numbness and sharp pains of feet, headaches, dizziness and all other reviewed systems are negative.   ALLERGIES: Allergies  Allergen Reactions   Ephedrine Palpitations    Heart rate fast-   Hibiclens [Chlorhexidine Gluconate] Rash    SKIN REDNESS AND BURNING SENSATION    NO CHG   Keflex [Cephalexin] Itching and Rash     HOME MEDICATIONS: Outpatient Medications Prior to Visit  Medication Sig Dispense Refill   amLODipine (NORVASC) 2.5 MG tablet Take 1 tablet (2.5 mg total) by mouth daily. 90 tablet 3   aspirin EC 81 MG tablet Take 81 mg by mouth at bedtime.      Coenzyme Q10 (COQ-10) 100 MG CAPS Take 1 capsule by mouth daily.     docusate sodium (COLACE) 100 MG capsule Take 200 mg by mouth at bedtime.     FLUoxetine (PROZAC) 20 MG capsule Take 20 mg by mouth every morning.      furosemide (LASIX) 40 MG tablet Take 40 mg by mouth daily.     gabapentin (NEURONTIN) 300 MG capsule Take 1 capsule (300 mg total) by mouth 2 (two) times daily. 180 capsule 0   ibuprofen (ADVIL,MOTRIN) 200 MG tablet Take 400 mg by mouth daily.      Krill Oil 500 MG CAPS Take 500 mg by mouth daily.     levothyroxine (SYNTHROID, LEVOTHROID) 100 MCG tablet Take 100 mcg by mouth daily before breakfast.      loratadine (CLARITIN) 10 MG tablet Take 10 mg by mouth daily.     losartan (COZAAR) 100 MG tablet Take 1 tablet (100 mg total) by  mouth every evening.     meclizine (ANTIVERT) 25 MG tablet Take 12.5 mg by mouth at bedtime as needed.     metFORMIN (GLUCOPHAGE-XR) 500 MG 24 hr tablet Take 500 mg by mouth daily.     metoprolol succinate (TOPROL-XL) 100 MG 24 hr tablet Take 100 mg by mouth every morning. Take with or immediately following a meal.     Multiple Vitamin (MULTIVITAMIN) tablet Take 1 tablet by mouth daily.     omeprazole (PRILOSEC) 20 MG capsule Take 20 mg by mouth daily before breakfast.     OZEMPIC, 0.25 OR 0.5 MG/DOSE, 2 MG/1.5ML SOPN Inject 1 mL  into the skin once a week.     potassium chloride (KLOR-CON) 10 MEQ tablet Take 10 mEq by mouth daily.     simvastatin (ZOCOR) 80 MG tablet Take 1 tablet by mouth at bedtime.     triamcinolone cream (KENALOG) 0.5 % Apply 1 application topically 3 (three) times daily as needed. For rash/irritation.     vitamin C (ASCORBIC ACID) 500 MG tablet Take 500 mg by mouth daily.     Vitamin D, Cholecalciferol, 1000 UNITS CAPS Take 1,000 mg by mouth 2 (two) times daily.     No facility-administered medications prior to visit.     PAST MEDICAL HISTORY: Past Medical History:  Diagnosis Date   Anxiety    Depression    DJD (degenerative joint disease)    Esophageal reflux    Essential hypertension    Fibrocystic breast disease    Hypothyroidism    Left bundle branch block    Transient - possibly rate related    Menopause    Osteoarthritis    PONV (postoperative nausea and vomiting)    pt. would like to have a scopolamine patch   Restless leg syndrome    Scoliosis    Type 2 diabetes mellitus (HCC)      PAST SURGICAL HISTORY: Past Surgical History:  Procedure Laterality Date   BREAST BIOPSY     lt x3   CARPOMETACARPEL SUSPENSION PLASTY Left 03/08/2014   Procedure: LEFT THUMB CARPOMETACARPEL (CMC) SUSPENSION PLASTY WITH TRAPEZIUM EXCISION;  Surgeon: Wyn Forster., MD;  Location: [facility];  Service: Orthopedics;  Laterality: Left;   CATARACT  EXTRACTION Bilateral    CESAREAN SECTION     CHOLECYSTECTOMY     COLONOSCOPY  03/26/2007   RMR:  Diffuse changes of proctocolitis as described above diminutive rectal polyp, cold biopsy/removed, pedunculated polyp of the splenic flexure hot snare removed.  Segmental biopsies the colon taken of the ileocecal valve ulcer biopsied normal terminal ileum   COLONOSCOPY N/A 01/05/2014   Procedure: COLONOSCOPY;  Surgeon: Malissa Hippo, MD;  Location: [facility];  Service: Endoscopy;  Laterality: N/A;  830   EYE SURGERY     GASTROCNEMIUS RECESSION Right 08/07/2016   Procedure: GASTROCNEMIUS RECESSION;  Surgeon: Nadara Mustard, MD;  Location: [facility];  Service: Orthopedics;  Laterality: Right;   Joint fusion-foot     x2 LEFT FOOT   KNEE ARTHROPLASTY  2001   Right   KNEE ARTHROPLASTY  2002   Left   LEFT HEART CATH AND CORONARY ANGIOGRAPHY N/A 06/18/2018   Procedure: LEFT HEART CATH AND CORONARY ANGIOGRAPHY;  Surgeon: Kathleene Hazel, MD;  Location: [facility];  Service: Cardiovascular;  Laterality: N/A;   Left leg vein resection     RETINAL DETACHMENT SURGERY Left 02/25/2022   done in [facility]   ROTATOR CUFF REPAIR     LEFT   THYROIDECTOMY  1980   3/4   TONSILLECTOMY     TUBAL LIGATION     WEIL OSTEOTOMY Right 08/07/2016   Procedure: WEIL OSTEOTOMY 2nd and 3rd Metatarsal Right Foot;  Surgeon: Nadara Mustard, MD;  Location: [facility];  Service: Orthopedics;  Laterality: Right;   WEIL OSTEOTOMY Left 12/25/2016   Procedure: 2nd Toe Proximal Interphalangeal Resection, Weil Osteotomy 2nd and 3rd Metatarsal Left Foot;  Surgeon: Nadara Mustard, MD;  Location: [facility];  Service: Orthopedics;  Laterality: Left;     FAMILY HISTORY: Family History  Problem Relation Age of Onset  Hypertension Mother    Heart failure Father        Died with pneumonia   Hypertension Father    Arthritis Father    Diabetes Father    COPD Brother    Asthma Paternal Grandfather    Emphysema Paternal  Grandfather      SOCIAL HISTORY: Social History   Socioeconomic History   Marital status: Married    Spouse name: Not on file   Number of children: 1   Years of education: Not on file   Highest education level: Not on file  Occupational History   Occupation: retired  Tobacco Use   Smoking status: Never   Smokeless tobacco: Never  Vaping Use   Vaping status: Never Used  Substance and Sexual Activity   Alcohol use: Yes    Alcohol/week: 0.0 standard drinks of alcohol    Comment: rare-- glass of wine at christmas   Drug use: No   Sexual activity: Not on file  Other Topics Concern   Not on file  Social History Narrative   Not on file   Social Determinants of Health   Financial Resource Strain: Low Risk  (06/10/2023)   Received from [facility]   Overall Financial Resource Strain (CARDIA)    Difficulty of Paying Living Expenses: Not hard at all  Food Insecurity: No Food Insecurity (06/10/2023)   Received from [facility]   Hunger Vital Sign    Worried About Running Out of Food in the Last Year: Never true    Ran Out of Food in the Last Year: Never true  Transportation Needs: No Transportation Needs (06/10/2023)   Received [facility]    Lack of Transportation (Medical): No    Lack of Transportation (Non-Medical): No  Physical Activity: Not on file  Stress: Not on file  Social Connections: Not on file  Intimate Partner Violence: Not At Risk (06/10/2023)   Received from [facility]   Humiliation, Afraid, Rape, and Kick questionnaire    Fear of Current or Ex-Partner: No    Emotionally Abused: No    Physically Abused: No    Sexually Abused: No     PHYSICAL EXAM  There were no vitals filed for this visit.   There is no height or weight on file to calculate BMI.  Generalized: Well developed, in no acute distress  Cardiology: normal rate and rhythm, no murmur auscultated  Respiratory: clear to auscultation  bilaterally    Neurological examination  Mentation: Alert oriented to time, place, history taking. Follows all commands speech and language fluent Cranial nerve II-XII: Pupils were equal round reactive to light. Extraocular movements were full, visual field were full on confrontational test. Facial sensation and strength were normal. Head turning and shoulder shrug  were normal and symmetric. Motor: The motor testing reveals 5 over 5 strength of all 4 extremities. Good symmetric motor tone is noted throughout.  Sensory: Sensory testing is intact to soft touch on all 4 extremities. No evidence of extinction is noted.  Gait and station: Gait is normal.    DIAGNOSTIC DATA (LABS, IMAGING, TESTING) - I reviewed patient records, labs, notes, testing and imaging myself where available.  Lab Results  Component Value Date   WBC 7.2 06/12/2018   HGB 14.4 06/12/2018   HCT 42.6 06/12/2018   MCV 90.1 06/12/2018   PLT 223 06/12/2018      Component Value Date/Time   NA 142 06/12/2018 1151  K 3.9 06/12/2018 1151   CL 105 06/12/2018 1151   CO2 29 06/12/2018 1151   GLUCOSE 157 (H) 06/12/2018 1151   BUN 17 06/12/2018 1151   CREATININE 1.17 (H) 06/12/2018 1151   CALCIUM 9.7 06/12/2018 1151   PROT 6.6 05/10/2021 1124   GFRNONAA 46 (L) 06/12/2018 1151   GFRAA 53 (L) 06/12/2018 1151   No results found for: "CHOL", "HDL", "LDLCALC", "LDLDIRECT", "TRIG", "CHOLHDL" Lab Results  Component Value Date   HGBA1C 6.6 (H) 12/20/2016   Lab Results  Component Value Date   VITAMINB12 679 05/10/2021   No results found for: "TSH"      No data to display               No data to display           ASSESSMENT AND PLAN  77 y.o. year old female  has a past medical history of Anxiety, Depression, DJD (degenerative joint disease), Esophageal reflux, Essential hypertension, Fibrocystic breast disease, Hypothyroidism, Left bundle branch block, Menopause, Osteoarthritis, PONV (postoperative nausea  and vomiting), Restless leg syndrome, Scoliosis, and Type 2 diabetes mellitus (HCC). here with    No diagnosis found.  Davinna reports that headaches and neuropathy are improved and stable. We will continue gabapentin up to 600mg  daily. Side effects reviewed. She may consider taking 300mg  early afternoon and 300mg  at bedtime to see if this helps with dizziness. I will send her to PT for vestibular therapy. She will continue close follow up with PCP and cardiology. She will focus on healthy lifestyle habits and sleep hygiene. Close follow up with PCP advised. She will return to see me in 6 months, sooner if needed.    No orders of the defined types were placed in this encounter.    No orders of the defined types were placed in this encounter.     Shawnie Dapper, MSN, FNP-C 06/10/2023, 4:38 PM  [facility] [address] [place], [place] [address] [phone]

## 2023-06-11 ENCOUNTER — Ambulatory Visit: Payer: Medicare Other | Admitting: Family Medicine

## 2023-06-11 DIAGNOSIS — G629 Polyneuropathy, unspecified: Secondary | ICD-10-CM

## 2023-06-11 DIAGNOSIS — G4486 Cervicogenic headache: Secondary | ICD-10-CM

## 2023-06-11 DIAGNOSIS — H81399 Other peripheral vertigo, unspecified ear: Secondary | ICD-10-CM

## 2023-06-16 ENCOUNTER — Telehealth: Payer: Self-pay | Admitting: Cardiology

## 2023-06-16 NOTE — Telephone Encounter (Signed)
Patient scheduled with [place] on 9/23 requested call if we have a opening with Dr.McDowell or MD

## 2023-06-16 NOTE — Telephone Encounter (Signed)
Pt states she was seen in the hospital and was told get an appt asap. She declined seeing APP. Please advise.

## 2023-06-18 ENCOUNTER — Ambulatory Visit: Payer: Medicare Other | Admitting: Internal Medicine

## 2023-06-18 ENCOUNTER — Encounter: Payer: Self-pay | Admitting: Internal Medicine

## 2023-06-18 VITALS — BP 128/72 | HR 91 | Ht 64.0 in | Wt 231.6 lb

## 2023-06-18 DIAGNOSIS — I5042 Chronic combined systolic (congestive) and diastolic (congestive) heart failure: Secondary | ICD-10-CM | POA: Diagnosis not present

## 2023-06-18 DIAGNOSIS — Z8719 Personal history of other diseases of the digestive system: Secondary | ICD-10-CM

## 2023-06-18 DIAGNOSIS — R932 Abnormal findings on diagnostic imaging of liver and biliary tract: Secondary | ICD-10-CM | POA: Diagnosis not present

## 2023-06-18 NOTE — Patient Instructions (Signed)
Please MyChart message our office in November to schedule your appointment for February or March.   _______________________________________________________  If your blood pressure at your visit was 140/90 or greater, please contact your primary care physician to follow up on this.  _______________________________________________________  If you are age 76 or older, your body mass index should be between 23-30. Your Body mass index is 39.75 kg/m. If this is out of the aforementioned range listed, please consider follow up with your Primary Care Provider.  If you are age 64 or younger, your body mass index should be between 19-25. Your Body mass index is 39.75 kg/m. If this is out of the aformentioned range listed, please consider follow up with your Primary Care Provider.   ________________________________________________________  The [facility] providers would like to encourage you to use [facility] to communicate with providers for non-urgent requests or questions.  Due to long hold times on the telephone, sending your provider a message by [facility] may be a faster and more efficient way to get a response.  Please allow 48 business hours for a response.  Please remember that this is for non-urgent requests.  _______________________________________________________

## 2023-06-18 NOTE — Progress Notes (Signed)
Patient ID: Grace Delacruz, female   DOB: 10/30/1945, 77 y.o.   MRN: [id number] HPI: Sita Mcguiness is a 77 year old female with a remote history of ulcerative colitis in longstanding remission without therapy, diabetes, hypothyroidism, hypertension, OSA, RLS, heart failure and recent hospitalization with heart failure exacerbation in the setting of COVID-19, bacterial pneumonia and UTI.  She is seen with her husband today.  Initially she was referred for left-sided abdominal pain and enlarged liver.  Subsequently she was admitted to the hospital with a heart failure exacerbation.  This was associated with a gradual 20 pound weight gain and severe dyspnea on exertion plus orthopnea and PND.  When she was admitted she required ICU level care with BiPAP.  She was diuresed over 20 pounds.  She was treated with antivirals for COVID-19, antibiotics for bacterial pneumonia and UTI.  She now feels so much better.  She is not having any abdominal pain.  Her breathing is much better.  She is waiting to follow-up with her cardiologist.  Her left-sided abdominal pressure has resolved.  Her bowel movements have been regular.  No blood in stool or melena.  No mucus in stool.  She does not have any family history of liver disease or IBD.  No family history of colon cancer.  She has been on Ozempic for a number of months and lost 45 pounds but prior to her hospitalization with heart failure exacerbation she had gained 20 of this back slowly.  She was having lower extremity edema which is also now better.  No jaundice, itching.  Past Medical History:  Diagnosis Date   Anxiety    Depression    DJD (degenerative joint disease)    Esophageal reflux    Essential hypertension    Fibrocystic breast disease    Hypothyroidism    Left bundle branch block    Transient - possibly rate related    Menopause    Osteoarthritis    PONV (postoperative nausea and vomiting)    pt. would like to have a scopolamine patch    Restless leg syndrome    Scoliosis    Type 2 diabetes mellitus (HCC)     Past Surgical History:  Procedure Laterality Date   BREAST BIOPSY     lt x3   CARPOMETACARPEL SUSPENSION PLASTY Left 03/08/2014   Procedure: LEFT THUMB CARPOMETACARPEL (CMC) SUSPENSION PLASTY WITH TRAPEZIUM EXCISION;  Surgeon: Wyn Forster., MD;  Location: [facility];  Service: Orthopedics;  Laterality: Left;   CATARACT EXTRACTION Bilateral    CESAREAN SECTION     CHOLECYSTECTOMY     COLONOSCOPY  03/26/2007   RMR:  Diffuse changes of proctocolitis as described above diminutive rectal polyp, cold biopsy/removed, pedunculated polyp of the splenic flexure hot snare removed.  Segmental biopsies the colon taken of the ileocecal valve ulcer biopsied normal terminal ileum   COLONOSCOPY N/A 01/05/2014   Procedure: COLONOSCOPY;  Surgeon: Malissa Hippo, MD;  Location: [facility];  Service: Endoscopy;  Laterality: N/A;  830   EYE SURGERY     GASTROCNEMIUS RECESSION Right 08/07/2016   Procedure: GASTROCNEMIUS RECESSION;  Surgeon: Nadara Mustard, MD;  Location: [facility];  Service: Orthopedics;  Laterality: Right;   Joint fusion-foot     x2 LEFT FOOT   KNEE ARTHROPLASTY  2001   Right   KNEE ARTHROPLASTY  2002   Left   LEFT HEART CATH AND CORONARY ANGIOGRAPHY N/A 06/18/2018   Procedure: LEFT HEART CATH AND CORONARY ANGIOGRAPHY;  Surgeon: Kathleene Hazel, MD;  Location: [facility];  Service: Cardiovascular;  Laterality: N/A;   Left leg vein resection     RETINAL DETACHMENT SURGERY Left 02/25/2022   done in [facility]   ROTATOR CUFF REPAIR     LEFT   THYROIDECTOMY  1980   3/4   TONSILLECTOMY     TUBAL LIGATION     WEIL OSTEOTOMY Right 08/07/2016   Procedure: WEIL OSTEOTOMY 2nd and 3rd Metatarsal Right Foot;  Surgeon: Nadara Mustard, MD;  Location: [facility];  Service: Orthopedics;  Laterality: Right;   WEIL OSTEOTOMY Left 12/25/2016   Procedure: 2nd Toe Proximal Interphalangeal Resection, Weil  Osteotomy 2nd and 3rd Metatarsal Left Foot;  Surgeon: Nadara Mustard, MD;  Location: [facility];  Service: Orthopedics;  Laterality: Left;    Outpatient Medications Prior to Visit  Medication Sig Dispense Refill   amLODipine (NORVASC) 2.5 MG tablet Take 1 tablet (2.5 mg total) by mouth daily. 90 tablet 3   aspirin EC 81 MG tablet Take 81 mg by mouth at bedtime.      Coenzyme Q10 (COQ-10) 100 MG CAPS Take 1 capsule by mouth daily.     docusate sodium (COLACE) 100 MG capsule Take 200 mg by mouth at bedtime.     FLUoxetine (PROZAC) 20 MG capsule Take 20 mg by mouth every morning.      furosemide (LASIX) 40 MG tablet Take 40 mg by mouth daily.     gabapentin (NEURONTIN) 300 MG capsule Take 1 capsule (300 mg total) by mouth 2 (two) times daily. 180 capsule 0   levothyroxine (SYNTHROID, LEVOTHROID) 100 MCG tablet Take 100 mcg by mouth daily before breakfast.      loratadine (CLARITIN) 10 MG tablet Take 10 mg by mouth daily.     losartan (COZAAR) 100 MG tablet Take 1 tablet (100 mg total) by mouth every evening.     meclizine (ANTIVERT) 25 MG tablet Take 12.5 mg by mouth at bedtime as needed.     metoprolol succinate (TOPROL-XL) 100 MG 24 hr tablet Take 100 mg by mouth every morning. Take with or immediately following a meal.     Multiple Vitamin (MULTIVITAMIN) tablet Take 1 tablet by mouth daily.     omeprazole (PRILOSEC) 20 MG capsule Take 20 mg by mouth daily before breakfast.     OZEMPIC, 0.25 OR 0.5 MG/DOSE, 2 MG/1.5ML SOPN Inject 1 mL into the skin once a week.     potassium chloride (KLOR-CON) 10 MEQ tablet Take 10 mEq by mouth daily.     simvastatin (ZOCOR) 80 MG tablet Take 1 tablet by mouth at bedtime.     triamcinolone cream (KENALOG) 0.5 % Apply 1 application topically 3 (three) times daily as needed. For rash/irritation.     vitamin C (ASCORBIC ACID) 500 MG tablet Take 500 mg by mouth daily.     Vitamin D, Cholecalciferol, 1000 UNITS CAPS Take 1,000 mg by mouth 2 (two) times daily.      ibuprofen (ADVIL,MOTRIN) 200 MG tablet Take 400 mg by mouth daily.  (Patient not taking: Reported on 06/18/2023)     Krill Oil 500 MG CAPS Take 500 mg by mouth daily. (Patient not taking: Reported on 06/18/2023)     metFORMIN (GLUCOPHAGE-XR) 500 MG 24 hr tablet Take 500 mg by mouth daily. (Patient not taking: Reported on 06/18/2023)     No facility-administered medications prior to visit.    Allergies  Allergen Reactions   Ephedrine Palpitations  Heart rate fast-   Hibiclens [Chlorhexidine Gluconate] Rash    SKIN REDNESS AND BURNING SENSATION    NO CHG   Keflex [Cephalexin] Itching and Rash    Family History  Problem Relation Age of Onset   Hypertension Mother    Heart failure Father        Died with pneumonia   Hypertension Father    Arthritis Father    Diabetes Father    COPD Brother    Asthma Paternal Grandfather    Emphysema Paternal Grandfather    Colon cancer Neg Hx    Esophageal cancer Neg Hx    Stomach cancer Neg Hx    Colon polyps Neg Hx     Social History   Tobacco Use   Smoking status: Never   Smokeless tobacco: Never  Vaping Use   Vaping status: Never Used  Substance Use Topics   Alcohol use: Yes    Alcohol/week: 0.0 standard drinks of alcohol    Comment: rare-- glass of wine at christmas   Drug use: No    ROS: As per history of present illness, otherwise negative  BP 128/72   Pulse 91   Ht 5\' 4"  (1.626 m)   Wt 231 lb 9.6 oz (105.1 kg)   SpO2 97%   BMI 39.75 kg/m  Gen: awake, alert, NAD HEENT: anicteric  CV: RRR, no mrg Pulm: CTA b/l Abd: soft, obese, NT/ND, +BS throughout, no hepatomegaly Ext: No clubbing or cyanosis, changes of mild venous stasis without edema Neuro: nonfocal   RELEVANT LABS AND IMAGING: CBC    Component Value Date/Time   WBC 7.2 06/12/2018 1151   RBC 4.73 06/12/2018 1151   HGB 14.4 06/12/2018 1151   HCT 42.6 06/12/2018 1151   PLT 223 06/12/2018 1151   MCV 90.1 06/12/2018 1151   MCH 30.4 06/12/2018 1151   MCHC  33.8 06/12/2018 1151   RDW 13.0 06/12/2018 1151   CMP     Component Value Date/Time   NA 142 06/12/2018 1151   K 3.9 06/12/2018 1151   CL 105 06/12/2018 1151   CO2 29 06/12/2018 1151   GLUCOSE 157 (H) 06/12/2018 1151   BUN 17 06/12/2018 1151   CREATININE 1.17 (H) 06/12/2018 1151   CALCIUM 9.7 06/12/2018 1151   PROT 6.6 05/10/2021 1124   GFRNONAA 46 (L) 06/12/2018 1151   LFTs normal -- see care everywhere  COMPARISON: 07/22/2014 CT abdomen/pelvis.   CT ABDOMEN AND PELVIS:   Lower chest: No significant pulmonary nodules or acute consolidative  airspace disease. Coronary atherosclerosis.   Hepatobiliary: Normal liver size. No liver mass. Cholecystectomy. No  biliary ductal dilatation.   Pancreas: Normal, with no mass or duct dilation.   Spleen: Normal size. No mass.   Adrenals/Urinary Tract: Normal adrenals. Simple 1.7 cm anterior  upper right renal cyst and scattered subcentimeter hypodense left  renal cortical lesions that are too small to characterize, for which  no follow-up imaging is recommended. No hydronephrosis. Normal  bladder.   Stomach/Bowel: Small hiatal hernia. Otherwise normal nondistended  stomach. Normal caliber small bowel with no small bowel wall  thickening. Normal appendix. Moderate sigmoid diverticulosis with no  large bowel wall thickening or significant pericolonic fat  stranding.   Vascular/Lymphatic: Atherosclerotic nonaneurysmal abdominal aorta.  Patent portal, splenic, hepatic and renal veins. Surgical clips  again noted in the left inguinal region. No pathologically enlarged  lymph nodes in the abdomen or pelvis.   Reproductive: Grossly normal uterus.  No adnexal mass.  Other: No pneumoperitoneum, ascites or focal fluid collection. Small  fat containing umbilical hernia is unchanged.   Musculoskeletal: No aggressive appearing focal osseous lesions.  Moderate thoracolumbar spondylosis. Mild levocurvature of the  thoracolumbar spine.    IMPRESSION FOR 05/30/2023 CT ABDOMEN AND PELVIS:   1. No acute abnormality. No evidence of bowel obstruction or acute  bowel inflammation. Moderate sigmoid diverticulosis, with no  evidence of acute diverticulitis.  2. Small hiatal hernia.  3. Coronary atherosclerosis.  4. Chronic small fat containing umbilical hernia.  5.  Aortic Atherosclerosis (ICD10-I70.0).    Electronically Signed    By: Delbert Phenix M.D.    On: 06/12/2023 11:42  ASSESSMENT/PLAN: 77 year old female with a remote history of ulcerative colitis in longstanding remission without therapy, diabetes, hypothyroidism, hypertension, OSA, RLS, heart failure and recent hospitalization with heart failure exacerbation in the setting of COVID-19, bacterial pneumonia and UTI.   Remote UC --ulcerative colitis was confirmed years ago by biopsy but her most recent colonoscopy in 2015 was normal.  She did not have any evidence of ongoing ulcerative colitis and has not required therapy.  Query if this was related to infectious etiology years ago because it does not seem that she has ulcerative colitis in any chronic fashion -- No therapy recommended at this time  2.  Previously enlarged liver/abdominal discomfort --very likely this was related to heart failure and pulmonary hypertension with venous stasis.  Recent cross-sectional imaging proves her liver is normal.  Her liver enzymes are normal.  There is no evidence for advanced liver disease or cirrhosis. --No further workup recommended; passive congestion from heart failure likely  3.  CRC screening --colonoscopy would be appropriate in or around March 2025; given recent CHF flare I will see her back in about 6 months and we can rediscuss at that time  4.  Heart failure exacerbation --she needs to tap back in with cardiology for CHF care, diuresis and management.  She we will continue to monitor weights daily.  She currently has a follow-up with Sharlene Dory with Dr. Ival Bible  office on 07/14/2023 but is on a wait list for an earlier cancellation spot  76-month follow-up    Cc:Donetta Potts, Md [address] [place],  [place] [address]

## 2023-06-24 ENCOUNTER — Encounter: Payer: Self-pay | Admitting: *Deleted

## 2023-06-25 ENCOUNTER — Ambulatory Visit: Payer: Medicare Other | Attending: Nurse Practitioner | Admitting: Cardiology

## 2023-06-25 ENCOUNTER — Encounter: Payer: Self-pay | Admitting: Cardiology

## 2023-06-25 VITALS — BP 110/68 | HR 78 | Ht 65.0 in | Wt 236.0 lb

## 2023-06-25 DIAGNOSIS — Z8679 Personal history of other diseases of the circulatory system: Secondary | ICD-10-CM

## 2023-06-25 DIAGNOSIS — I10 Essential (primary) hypertension: Secondary | ICD-10-CM | POA: Diagnosis not present

## 2023-06-25 DIAGNOSIS — I493 Ventricular premature depolarization: Secondary | ICD-10-CM

## 2023-06-25 DIAGNOSIS — I5032 Chronic diastolic (congestive) heart failure: Secondary | ICD-10-CM | POA: Diagnosis not present

## 2023-06-25 DIAGNOSIS — E1169 Type 2 diabetes mellitus with other specified complication: Secondary | ICD-10-CM | POA: Diagnosis not present

## 2023-06-25 DIAGNOSIS — K760 Fatty (change of) liver, not elsewhere classified: Secondary | ICD-10-CM | POA: Diagnosis not present

## 2023-06-25 DIAGNOSIS — E039 Hypothyroidism, unspecified: Secondary | ICD-10-CM | POA: Diagnosis not present

## 2023-06-25 DIAGNOSIS — E1165 Type 2 diabetes mellitus with hyperglycemia: Secondary | ICD-10-CM | POA: Diagnosis not present

## 2023-06-25 NOTE — Patient Instructions (Signed)
Medication Instructions:   Stop Amlodipine (Norvasc) Continue all other medications.     Labwork:  none  Testing/Procedures:  none  Follow-Up:  3 months   Any Other Special Instructions Will Be Listed Below (If Applicable).   If you need a refill on your cardiac medications before your next appointment, please call your pharmacy.

## 2023-06-25 NOTE — Progress Notes (Signed)
Cardiology Office Note  Date: 06/25/2023   ID: TANNISHA MENDES, DOB November 20, 1945, MRN [id number]  History of Present Illness: Grace Delacruz is a 77 y.o. female last seen in October 2023.  She presents for a follow-up visit.  I reviewed available records, she was recently hospitalized at [facility] in August with COVID-19 pneumonia.  She did have evidence of demand ischemia with abnormal high-sensitivity troponin I levels, follow-up echocardiogram however showed normal LVEF greater than 55%.  She is here today with her husband.  States that she feels better, still has not regained her energy or started walking for exercise.  She has been tracking her weights daily, does not report any leg swelling or other obvious fluid reaccumulation.  We did discuss as needed adjustments in Lasix based on weight change and continued observation.  We also went over her home heart rate and blood pressure checks.  Blood pressure has been low normal, we discussed stopping amlodipine for the time being.  She is otherwise on Toprol-XL and Cozaar.  She does not describe any palpitations.  No dizziness or syncope.  Physical Exam: VS:  BP 110/68   Pulse 78   Ht 5\' 5"  (1.651 m)   Wt 236 lb (107 kg)   SpO2 95%   BMI 39.27 kg/m , BMI Body mass index is 39.27 kg/m.  Wt Readings from Last 3 Encounters:  06/25/23 236 lb (107 kg)  06/18/23 231 lb 9.6 oz (105.1 kg)  07/31/22 235 lb 9.6 oz (106.9 kg)    General: Patient appears comfortable at rest. HEENT: Conjunctiva and lids normal. Neck: Supple, no elevated JVP or carotid bruits. Lungs: Clear to auscultation, nonlabored breathing at rest. Cardiac: Regular rate and rhythm, no S3 or significant systolic murmur, no pericardial rub. Extremities: No pitting edema.  ECG:  An ECG dated 07/31/2022 was personally reviewed today and demonstrated:  Sinus rhythm with low voltage.  Labwork:  August 2024: Hemoglobin 15.1, platelets 216, potassium 3.9, BUN 30,  creatinine 0.99, high-sensitivity troponin I ranging 188-689  Other Studies Reviewed Today:  Echocardiogram 06/09/2023 [facility]): Summary   1. The left ventricle is normal in size with mildly increased wall  thickness.   2. The left ventricular systolic function is normal, LVEF is visually  estimated at > 55%.    3. Mitral annular calcification is present.    4. The aortic valve is trileaflet with mildly thickened leaflets with normal  excursion.   5. The left atrium is mildly dilated in size.    6. The right ventricle is normal in size, with normal systolic function.   Assessment and Plan:  1.  Mild nonobstructive CAD by cardiac catheterization in 2019.  LVEF greater than 55% by recent follow-up echocardiogram at [facility].  She was hospitalized with COVID-19 pneumonia and did have evidence of demand ischemia by cardiac enzymes, but no clear ACS.  She reports no angina.  Follow-up ischemic testing is not indicated at this time.  Will continue aspirin and Zocor as before.  2.  Frequent PVCs and NSVT in the setting of normal LVEF.  She is symptomatically stable and continues on Toprol-XL 100 mg daily.  3.  Intermittent, likely rate related left bundle branch block.  4.  Essential hypertension.  Blood pressure low normal at this time.  Stopping Norvasc.  Continue Cozaar along with Toprol-XL.  She will continue to track blood pressure at home.  5.  Fluid retention, possibly HFpEF but also recent COVID-19 pneumonia with  hypoxic respiratory failure.  She diuresed substantially while hospitalized.  Continue to check daily weights, she is on Lasix with potassium supplement daily.  Discussed as needed additional Lasix use for weight gain.  May need to modify diuretic regimen further going forward depending on how she does.  She is also on Ozempic and hoping to lose some more weight.  Disposition:  Follow up  3 months.  Signed, Jonelle Sidle, M.D., F.A.C.C. [place]  [facility]

## 2023-06-26 LAB — TSH: TSH: 0.09 — AB (ref 0.41–5.90)

## 2023-06-27 DIAGNOSIS — M6281 Muscle weakness (generalized): Secondary | ICD-10-CM | POA: Diagnosis not present

## 2023-06-27 DIAGNOSIS — U099 Post covid-19 condition, unspecified: Secondary | ICD-10-CM | POA: Diagnosis not present

## 2023-06-29 DIAGNOSIS — M6281 Muscle weakness (generalized): Secondary | ICD-10-CM | POA: Diagnosis not present

## 2023-06-29 DIAGNOSIS — U099 Post covid-19 condition, unspecified: Secondary | ICD-10-CM | POA: Diagnosis not present

## 2023-07-02 DIAGNOSIS — U099 Post covid-19 condition, unspecified: Secondary | ICD-10-CM | POA: Diagnosis not present

## 2023-07-02 DIAGNOSIS — M6281 Muscle weakness (generalized): Secondary | ICD-10-CM | POA: Diagnosis not present

## 2023-07-07 DIAGNOSIS — M419 Scoliosis, unspecified: Secondary | ICD-10-CM | POA: Diagnosis not present

## 2023-07-07 DIAGNOSIS — Z23 Encounter for immunization: Secondary | ICD-10-CM | POA: Diagnosis not present

## 2023-07-07 DIAGNOSIS — E039 Hypothyroidism, unspecified: Secondary | ICD-10-CM | POA: Diagnosis not present

## 2023-07-07 DIAGNOSIS — Z8619 Personal history of other infectious and parasitic diseases: Secondary | ICD-10-CM | POA: Diagnosis not present

## 2023-07-07 DIAGNOSIS — Z0001 Encounter for general adult medical examination with abnormal findings: Secondary | ICD-10-CM | POA: Diagnosis not present

## 2023-07-07 DIAGNOSIS — E876 Hypokalemia: Secondary | ICD-10-CM | POA: Diagnosis not present

## 2023-07-07 DIAGNOSIS — U071 COVID-19: Secondary | ICD-10-CM | POA: Diagnosis not present

## 2023-07-07 DIAGNOSIS — R609 Edema, unspecified: Secondary | ICD-10-CM | POA: Diagnosis not present

## 2023-07-07 DIAGNOSIS — I10 Essential (primary) hypertension: Secondary | ICD-10-CM | POA: Diagnosis not present

## 2023-07-07 DIAGNOSIS — E1165 Type 2 diabetes mellitus with hyperglycemia: Secondary | ICD-10-CM | POA: Diagnosis not present

## 2023-07-08 DIAGNOSIS — U099 Post covid-19 condition, unspecified: Secondary | ICD-10-CM | POA: Diagnosis not present

## 2023-07-08 DIAGNOSIS — M6281 Muscle weakness (generalized): Secondary | ICD-10-CM | POA: Diagnosis not present

## 2023-07-10 DIAGNOSIS — M6281 Muscle weakness (generalized): Secondary | ICD-10-CM | POA: Diagnosis not present

## 2023-07-10 DIAGNOSIS — U099 Post covid-19 condition, unspecified: Secondary | ICD-10-CM | POA: Diagnosis not present

## 2023-07-12 DIAGNOSIS — Z8619 Personal history of other infectious and parasitic diseases: Secondary | ICD-10-CM | POA: Diagnosis not present

## 2023-07-12 DIAGNOSIS — Z87440 Personal history of urinary (tract) infections: Secondary | ICD-10-CM | POA: Diagnosis not present

## 2023-07-12 DIAGNOSIS — U071 COVID-19: Secondary | ICD-10-CM | POA: Diagnosis not present

## 2023-07-14 ENCOUNTER — Ambulatory Visit: Payer: Medicare Other | Admitting: Nurse Practitioner

## 2023-07-15 DIAGNOSIS — M6281 Muscle weakness (generalized): Secondary | ICD-10-CM | POA: Diagnosis not present

## 2023-07-15 DIAGNOSIS — U099 Post covid-19 condition, unspecified: Secondary | ICD-10-CM | POA: Diagnosis not present

## 2023-07-16 ENCOUNTER — Ambulatory Visit: Payer: Medicare Other | Admitting: Internal Medicine

## 2023-07-16 ENCOUNTER — Encounter: Payer: Self-pay | Admitting: Internal Medicine

## 2023-07-16 VITALS — BP 132/62 | HR 77 | Ht 65.0 in | Wt 236.0 lb

## 2023-07-16 DIAGNOSIS — R0609 Other forms of dyspnea: Secondary | ICD-10-CM | POA: Diagnosis not present

## 2023-07-16 NOTE — Progress Notes (Signed)
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 female who presented to the ED on 8/17 with diagnosis of COVID 19 pneumonia. Initial troponin 330, trended up to 689, then back down to 555. [facility] cardiology was consulted who did not feel the elevated troponin was indicative of ACS or NSTEMI. Patient was placed on BiPAP due to increased work of breathing and tachypnea and was treated with IV Lasix. Admitted to ICU for treatment and management. She was aggressively diuresed and was moved out of ICU on 8/20 as she continued to improve. Oxygen was gradually weaned. Oxygen testing 8/21 showed no need for exertional oxygen. Today she is feeling well and feels she is ready for discharge home.      COVID-19 virus infection, pneumonia of both lungs due to infectious organism, acute hypoxic respiratory failure Chest x-ray 8/16 shows right greater than left airspace disease and consolidation suspicious for pneumonia CTA rules out PE Was started on vancomycin and cefepime, de-escalated to Cipro 8/19 MRSA screening negative White blood count 13.3, patient is receiving steroids IV She is afebrile  Requiring only 1 L of oxygen Encouraged incentive spirometer and Acapella Neb treatments, mucinex, remdesivir day 5, vitamin regimen Repeat chest x ray 8/21 shows no abnormalities, fluid/edema has cleared 6 min walk test 8/21 shows no need for oxygen supplementation  2. Sepsis with organ dysfunction Due to COVID-19 pneumonia As  evidenced by need for BiPAP, creatinine 1.5 times baseline, white blood count 20.3 initially Hypotensive, tachynpeic Blood cultures negative Sepsis resolved  3. Elevated troponins Thought to be due to demand, patient with fluid overload upon arrival Cardiology followed along while admitted  Troponins have trended down Patient is not having chest pain  4. HTN BP high at times while admitted Prn hydralazine in place Toprol home dose resumed, Norvasc dosage increased from 2.5mg  daily to 5mg  daily  5. Type 2 diabetes SSI, Lantus 5 units nightly while admitted Glucose 182-251 in the past 24 hours A1C 6.0 Resume Ozempic, home regimen at discharge  6. AKI Creatinine 1.59 8/18, down to 0.99 today Has been getting IV Lasix, to change to PO home dosage tomorrow Output has been excellent  7. Acute cystitis Urine culture shows E. coli sensitive to Cipro Changed IV antibiotics to Cipro due to cephalexin allergy Received 2 days of cefepime, today is day 6 of treatment Patient has frequent UTIs Continue 4 additional days of Cipro outpatient, rx electronically sent to [facility] in [place]  8. Fluid overload Resolved Received aggressive IV diuresis Strict intake and output shows excellent output Updated echo 8/20, showed preserved EF of >55%      History of Present Illness  07/16/2023  Pulmonary/ 1st office eval/Grace Delacruz  Chief Complaint  Patient presents with   Establish Care    Abn pulm imaging  Baseline ex slower pace last several years but could  shop for at least 30 min at s stopping due to doe  PTA Dyspnea:  physical therapy twice a week plus ex her own but  not checking sats while ex   Cough: resolved  Sleep: 10-15 degrees HOB new setting since covid  SABA use: none  No obvious day to day or daytime pattern/variability or assoc excess/ purulent sputum or mucus plugs or hemoptysis or cp or chest tightness, subjective wheeze or overt sinus or hb symptoms.    Also denies any obvious  fluctuation of symptoms with weather or environmental changes or other aggravating or alleviating factors except as outlined above   No unusual exposure hx or h/o childhood pna/ asthma or knowledge of premature birth.  Current Allergies, Complete Past Medical History, Past Surgical History, Family History, and Social History were reviewed in [employer] record.  ROS  The following are not active complaints unless bolded Hoarseness, sore throat, dysphagia, dental problems, itching, sneezing,  nasal congestion or discharge of excess mucus or purulent secretions, ear ache,   fever, chills, sweats, unintended wt loss or wt gain, classically pleuritic or exertional cp,  orthopnea pnd or arm/hand swelling  or leg swelling, presyncope, palpitations, abdominal pain, anorexia, nausea, vomiting, diarrhea  or change in bowel habits or change in bladder habits, change in stools or change in urine, dysuria, hematuria,  rash, arthralgias, visual complaints, headache, numbness, weakness or ataxia or problems with walking or coordination,  change in mood or  memory.             Outpatient Medications Prior to Visit  Medication Sig Dispense Refill   aspirin EC 81 MG tablet Take 81 mg by mouth at bedtime.      Coenzyme Q10 (COQ-10) 100 MG CAPS Take 1 capsule by mouth daily.     docusate sodium (COLACE) 100 MG capsule Take 200 mg by mouth at bedtime.     FLUoxetine (PROZAC) 20 MG capsule Take 20 mg by mouth every morning.      furosemide (LASIX) 40 MG tablet Take 40 mg by mouth daily.     gabapentin (NEURONTIN) 300 MG capsule Take 1 capsule (300 mg total) by mouth 2 (two) times daily. 180 capsule 0   levothyroxine (SYNTHROID) 88 MCG tablet Take 88 mcg by mouth daily.     loratadine (CLARITIN) 10 MG tablet Take 10 mg by mouth daily.     losartan (COZAAR) 100 MG tablet Take 1 tablet (100 mg total) by mouth every evening.     meclizine (ANTIVERT) 25 MG tablet Take 12.5 mg by mouth at bedtime  as needed.     metoprolol succinate (TOPROL-XL) 100 MG 24 hr tablet Take 100 mg by mouth every morning. Take with or immediately following a meal.     Multiple Vitamin (MULTIVITAMIN) tablet Take 1 tablet by mouth daily.     omeprazole (PRILOSEC) 20 MG capsule Take 20 mg by mouth daily before breakfast.     OZEMPIC, 0.25 OR 0.5 MG/DOSE, 2 MG/1.5ML SOPN Inject 1 mL into the skin once a week.     potassium chloride (KLOR-CON) 10 MEQ tablet Take 10 mEq by mouth daily.     simvastatin (ZOCOR) 80 MG tablet Take 1 tablet by mouth at bedtime.     triamcinolone cream (KENALOG) 0.5 % Apply 1 application topically 3 (three) times daily as needed. For rash/irritation.     vitamin C (ASCORBIC ACID) 500 MG tablet Take 500 mg by mouth daily.     Vitamin D, Cholecalciferol, 1000 UNITS CAPS Take 1,000 mg by mouth 2 (two) times daily.     levothyroxine (SYNTHROID, LEVOTHROID) 100 MCG tablet Take 100 mcg  by mouth daily before breakfast.      No facility-administered medications prior to visit.    Past Medical History:  Diagnosis Date   Anxiety    Depression    DJD (degenerative joint disease)    Esophageal reflux    Essential hypertension    Fibrocystic breast disease    Hypothyroidism    Left bundle branch block    Transient - possibly rate related    Menopause    Osteoarthritis    PONV (postoperative nausea and vomiting)    pt. would like to have a scopolamine patch   Restless leg syndrome    Scoliosis    Type 2 diabetes mellitus (HCC)       Objective:     Ht 5\' 5"  (1.651 m)   Wt 236 lb (107 kg)   BMI 39.27 kg/m   Vital signs reviewed  07/16/2023  - Note at rest 02 sats  94% on RA   General appearance:    amb mod obese wf nad    HEENT : Oropharynx  clear    NECK :  without  apparent JVD/ palpable Nodes/TM    LUNGS: no acc muscle use,  Nl contour chest which is clear to A and P bilaterally without cough on insp or exp maneuvers   CV:  RRR  no s3 or murmur or increase in P2, and  trace edema    ABD:  soft and nontender with nl inspiratory excursion in the supine position. No bruits or organomegaly appreciated   MS:  Nl gait/ ext warm without deformities Or obvious joint restrictions  calf tenderness, cyanosis or clubbing    SKIN: warm and dry without lesions    NEURO:  alert, approp, nl sensorium with  no motor or cerebellar deficits apparent.       I personally reviewed images and agree with radiology impression as follows:  CXR:   pa and lateral  06/02/23  No active dz  CT a  06/06/23 1. No large central pulmonary embolus. Evaluation of the distal  pulmonary arteries is limited due to respiratory motion and  infiltrates.  2. Patchy opacities in the lungs bilaterally with consolidation in  the right upper lobe, right middle lobe, and bilateral lower lobes,  possible edema versus multifocal pneumonia.  3. Distended pulmonary trunk suggesting underlying pulmonary artery  hypertension.  4. Cardiomegaly with scattered coronary artery calcifications.  5. Aortic atherosclerosis.      Assessment   DOE (dyspnea on exertion) Onset with covid 19 pna  06/06/2023 > walking sats ok at d/c 06/12/23  - echo 06/09/23  1. The left ventricle is normal in size with mildly increased wall  thickness.   2. The left ventricular systolic function is normal, LVEF is visually  estimated at > 55%.    3. Mitral annular calcification is present.    4. The aortic valve is trileaflet with mildly thickened leaflets with normal  excursion.   5. The left atrium is mildly dilated in size.    6. The right ventricle is normal in size, with normal systolic function.  - 07/16/2023   Walked on RA  x  1  lap(s) =  approx 250  ft  @ slow/ slt unsteady pace, stopped due to hip pain/ balance issues s sob with lowest 02 sats 94%   She has actually made steady progress since d/c but limited at this point by obesity/ conditioning and hip problems so rec   >>>Continue sub max  ex building up to 30  min daily if possible and consider water aerobics if hip is the limiting factor  >>>Make sure you check your oxygen saturation at your highest level of activity(NOT after you stop)  to be sure it stays over 90% and keep track of it at least once a week, more often if breathing getting worse, and let me know if losing ground. (Collect the dots to connect the dots approach)    >>> Overnight pulse ox to assure adequate 02 sats and consider sleep medicine eval if desaturating in view of CTa findings of elevated PA (though not supported by echo)    >>> F/u in [facility] @ 6 weeks in [place]   Each maintenance medication was reviewed in detail including emphasizing most importantly the difference between maintenance and prns and under what circumstances the prns are to be triggered using an action plan format where appropriate.  Total time for H and P, chart review, counseling,  directly observing portions of ambulatory 02 saturation study/ and generating customized AVS unique to this office visit / same day charting  > 45 min new pt eval                       Sandrea Hughs, MD 07/16/2023

## 2023-07-16 NOTE — Assessment & Plan Note (Addendum)
Onset with covid 19 pna  06/06/2023 > walking sats ok at d/c 06/12/23  - echo 06/09/23  1. The left ventricle is normal in size with mildly increased wall  thickness.   2. The left ventricular systolic function is normal, LVEF is visually  estimated at > 55%.    3. Mitral annular calcification is present.    4. The aortic valve is trileaflet with mildly thickened leaflets with normal  excursion.   5. The left atrium is mildly dilated in size.    6. The right ventricle is normal in size, with normal systolic function.  - 07/16/2023   Walked on RA  x  1  lap(s) =  approx 250  ft  @ slow/ slt unsteady pace, stopped due to hip pain/ balance issues s sob with lowest 02 sats 94%   She has actually made steady progress since d/c but limited at this point by obesity/ conditioning and hip problems so rec   >>>Continue sub max ex building up to 30 min daily if possible and consider water aerobics if hip is the limiting factor  >>>Make sure you check your oxygen saturation at your highest level of activity(NOT after you stop)  to be sure it stays over 90% and keep track of it at least once a week, more often if breathing getting worse, and let me know if losing ground. (Collect the dots to connect the dots approach)    >>> Overnight pulse ox to assure adequate 02 sats and consider sleep medicine eval if desaturating in view of CTa findings of elevated PA (though not supported by echo)    >>> F/u in [facility] @ 6 weeks in [place]   Each maintenance medication was reviewed in detail including emphasizing most importantly the difference between maintenance and prns and under what circumstances the prns are to be triggered using an action plan format where appropriate.  Total time for H and P, chart review, counseling,  directly observing portions of ambulatory 02 saturation study/ and generating customized AVS unique to this office visit / same day charting  > 45 min new pt eval

## 2023-07-16 NOTE — Patient Instructions (Signed)
My office will be contacting you by phone for referral for Overnight 02 on Room air   - if you don't hear back from my office within one week please call us back or notify us thru MyChart and we'll address it right away.    To get the most out of exercise, you need to be continuously aware that you are short of breath, but never out of breath, for at least 30 minutes daily. As you improve, it will actually be easier for you to do the same amount of exercise  in  30 minutes so always push to the level where you are short of breath.     Make sure you check your oxygen saturations at highest level of activity    Please schedule a follow up office visit in 6 weeks, call sooner if needed in [facility] office

## 2023-07-17 DIAGNOSIS — U099 Post covid-19 condition, unspecified: Secondary | ICD-10-CM | POA: Diagnosis not present

## 2023-07-17 DIAGNOSIS — M6281 Muscle weakness (generalized): Secondary | ICD-10-CM | POA: Diagnosis not present

## 2023-07-21 DIAGNOSIS — E1169 Type 2 diabetes mellitus with other specified complication: Secondary | ICD-10-CM | POA: Diagnosis not present

## 2023-07-21 DIAGNOSIS — I10 Essential (primary) hypertension: Secondary | ICD-10-CM | POA: Diagnosis not present

## 2023-07-21 DIAGNOSIS — E039 Hypothyroidism, unspecified: Secondary | ICD-10-CM | POA: Diagnosis not present

## 2023-07-23 DIAGNOSIS — I10 Essential (primary) hypertension: Secondary | ICD-10-CM | POA: Diagnosis not present

## 2023-07-23 DIAGNOSIS — E119 Type 2 diabetes mellitus without complications: Secondary | ICD-10-CM | POA: Diagnosis not present

## 2023-07-23 DIAGNOSIS — E1169 Type 2 diabetes mellitus with other specified complication: Secondary | ICD-10-CM | POA: Diagnosis not present

## 2023-07-23 DIAGNOSIS — E039 Hypothyroidism, unspecified: Secondary | ICD-10-CM | POA: Diagnosis not present

## 2023-07-23 LAB — HEMOGLOBIN A1C: A1c: 5.8

## 2023-07-23 NOTE — Patient Instructions (Signed)

## 2023-07-25 ENCOUNTER — Encounter: Payer: Self-pay | Admitting: Nurse Practitioner

## 2023-07-25 ENCOUNTER — Ambulatory Visit: Payer: Medicare Other | Admitting: Nurse Practitioner

## 2023-07-25 VITALS — BP 116/74 | HR 76 | Ht 64.0 in | Wt 237.8 lb

## 2023-07-25 DIAGNOSIS — E041 Nontoxic single thyroid nodule: Secondary | ICD-10-CM

## 2023-07-25 DIAGNOSIS — E039 Hypothyroidism, unspecified: Secondary | ICD-10-CM

## 2023-07-25 DIAGNOSIS — E890 Postprocedural hypothyroidism: Secondary | ICD-10-CM | POA: Diagnosis not present

## 2023-07-25 NOTE — Progress Notes (Signed)
Endocrinology Consult Note                                         07/25/2023, 10:42 AM  Subjective:   Subjective    Grace Delacruz is a 77 y.o.-year-old female patient being seen in consultation for hypothyroidism referred by Donetta Potts, MD.  She was recently hospitalized in August with COVID pneumonia, sepsis, UTI, and CHF.  During the hospitalization she had CT scan of chest which incidentally noted a 2.8 left thyroid nodule.  Patient reports she has 3/4 of her thyroid removed at age 17 years old from nodules on the thyroid.  She has been on Levothyroxine ever since, was previously on 125 mcg but reduced recently for dropping TSH numbers.   Past Medical History:  Diagnosis Date   Anxiety    Depression    DJD (degenerative joint disease)    Esophageal reflux    Essential hypertension    Fibrocystic breast disease    Hypothyroidism    Left bundle branch block    Transient - possibly rate related    Menopause    Osteoarthritis    PONV (postoperative nausea and vomiting)    pt. would like to have a scopolamine patch   Restless leg syndrome    Scoliosis    Type 2 diabetes mellitus (HCC)     Past Surgical History:  Procedure Laterality Date   BREAST BIOPSY     lt x3   CARPOMETACARPEL SUSPENSION PLASTY Left 03/08/2014   Procedure: LEFT THUMB CARPOMETACARPEL (CMC) SUSPENSION PLASTY WITH TRAPEZIUM EXCISION;  Surgeon: Wyn Forster., MD;  Location: [facility];  Service: Orthopedics;  Laterality: Left;   CATARACT EXTRACTION Bilateral    CESAREAN SECTION     CHOLECYSTECTOMY     COLONOSCOPY  03/26/2007   RMR:  Diffuse changes of proctocolitis as described above diminutive rectal polyp, cold biopsy/removed, pedunculated polyp of the splenic flexure hot snare removed.  Segmental biopsies the colon taken of the ileocecal valve ulcer biopsied normal terminal ileum   COLONOSCOPY N/A 01/05/2014    Procedure: COLONOSCOPY;  Surgeon: Malissa Hippo, MD;  Location: [facility];  Service: Endoscopy;  Laterality: N/A;  830   EYE SURGERY     GASTROCNEMIUS RECESSION Right 08/07/2016   Procedure: GASTROCNEMIUS RECESSION;  Surgeon: Nadara Mustard, MD;  Location: [facility];  Service: Orthopedics;  Laterality: Right;   Joint fusion-foot     x2 LEFT FOOT   KNEE ARTHROPLASTY  2001   Right   KNEE ARTHROPLASTY  2002   Left   LEFT HEART CATH AND CORONARY ANGIOGRAPHY N/A 06/18/2018   Procedure: LEFT HEART CATH AND CORONARY ANGIOGRAPHY;  Surgeon: Kathleene Hazel, MD;  Location: [facility];  Service: Cardiovascular;  Laterality: N/A;   Left leg vein resection     RETINAL DETACHMENT SURGERY Left 02/25/2022   done in [facility]   ROTATOR CUFF REPAIR     LEFT   THYROIDECTOMY  1980  3/4   TONSILLECTOMY     TUBAL LIGATION     WEIL OSTEOTOMY Right 08/07/2016   Procedure: WEIL OSTEOTOMY 2nd and 3rd Metatarsal Right Foot;  Surgeon: Nadara Mustard, MD;  Location: [facility];  Service: Orthopedics;  Laterality: Right;   WEIL OSTEOTOMY Left 12/25/2016   Procedure: 2nd Toe Proximal Interphalangeal Resection, Weil Osteotomy 2nd and 3rd Metatarsal Left Foot;  Surgeon: Nadara Mustard, MD;  Location: [facility];  Service: Orthopedics;  Laterality: Left;    Social History   Socioeconomic History   Marital status: Married    Spouse name: Not on file   Number of children: 1   Years of education: Not on file   Highest education level: Not on file  Occupational History   Occupation: retired  Tobacco Use   Smoking status: Never   Smokeless tobacco: Never  Vaping Use   Vaping status: Never Used  Substance and Sexual Activity   Alcohol use: Yes    Alcohol/week: 0.0 standard drinks of alcohol    Comment: rare-- glass of wine at christmas   Drug use: No   Sexual activity: Not on file  Other Topics Concern   Not on file  Social History Narrative   Not on file   Social Determinants of Health    Financial Resource Strain: Low Risk  (06/10/2023)   Received from [facility]   Overall Financial Resource Strain (CARDIA)    Difficulty of Paying Living Expenses: Not hard at all  Food Insecurity: No Food Insecurity (06/10/2023)   Received from [facility]   Hunger Vital Sign    Worried About Running Out of Food in the Last Year: Never true    Ran Out of Food in the Last Year: Never true  Transportation Needs: No Transportation Needs (06/10/2023)   Received from [facility] - Transportation    Lack of Transportation (Medical): No    Lack of Transportation (Non-Medical): No  Physical Activity: Not on file  Stress: Not on file  Social Connections: Not on file    Family History  Problem Relation Age of Onset   Hypertension Mother    Heart failure Father        Died with pneumonia   Hypertension Father    Arthritis Father    Diabetes Father    COPD Brother    Asthma Paternal Grandfather    Emphysema Paternal Grandfather    Colon cancer Neg Hx    Esophageal cancer Neg Hx    Stomach cancer Neg Hx    Colon polyps Neg Hx     Outpatient Encounter Medications as of 07/25/2023  Medication Sig   aspirin EC 81 MG tablet Take 81 mg by mouth at bedtime.    Coenzyme Q10 (COQ-10) 100 MG CAPS Take 1 capsule by mouth daily.   docusate sodium (COLACE) 100 MG capsule Take 200 mg by mouth at bedtime.   FLUoxetine (PROZAC) 20 MG capsule Take 20 mg by mouth every morning.    furosemide (LASIX) 40 MG tablet Take 40 mg by mouth daily.   gabapentin (NEURONTIN) 300 MG capsule Take 1 capsule (300 mg total) by mouth 2 (two) times daily.   levothyroxine (SYNTHROID) 88 MCG tablet Take 88 mcg by mouth daily.   loratadine (CLARITIN) 10 MG tablet Take 10 mg by mouth daily.   losartan (COZAAR) 100 MG tablet Take 1 tablet (100 mg total) by mouth every evening.   meclizine (ANTIVERT) 25 MG tablet  Take 12.5 mg by mouth at bedtime as needed.   metoprolol succinate (TOPROL-XL) 100 MG 24  hr tablet Take 100 mg by mouth every morning. Take with or immediately following a meal.   Multiple Vitamin (MULTIVITAMIN) tablet Take 1 tablet by mouth daily.   omeprazole (PRILOSEC) 20 MG capsule Take 20 mg by mouth daily before breakfast.   OZEMPIC, 0.25 OR 0.5 MG/DOSE, 2 MG/1.5ML SOPN Inject 1 mL into the skin once a week.   potassium chloride (KLOR-CON) 10 MEQ tablet Take 10 mEq by mouth daily.   simvastatin (ZOCOR) 80 MG tablet Take 1 tablet by mouth at bedtime.   triamcinolone cream (KENALOG) 0.5 % Apply 1 application topically 3 (three) times daily as needed. For rash/irritation.   vitamin C (ASCORBIC ACID) 500 MG tablet Take 500 mg by mouth daily.   Vitamin D, Cholecalciferol, 1000 UNITS CAPS Take 1,000 mg by mouth 2 (two) times daily.   No facility-administered encounter medications on file as of 07/25/2023.    ALLERGIES: Allergies  Allergen Reactions   Ephedrine Palpitations    Heart rate fast-   Hibiclens [Chlorhexidine Gluconate] Rash    SKIN REDNESS AND BURNING SENSATION    NO CHG   Keflex [Cephalexin] Itching and Rash   VACCINATION STATUS: Immunization History  Administered Date(s) Administered   Influenza,inj,Quad PF,6+ Mos 07/26/2013   Influenza-Unspecified 05/21/2014   Pneumococcal Conjugate-13 10/22/2011   Pneumococcal Polysaccharide-23 07/28/2012   Td (Adult),5 Lf Tetanus Toxid, Preservative Free 01/23/2013   Zoster Recombinant(Shingrix) 02/08/2019, 05/08/2019     HPI   Grace Delacruz  is a patient with the above medical history. she was diagnosed with hypothyroidism at approximate age of 33 years after she had 3/4 of her thyroid removed due to nodules, which required subsequent initiation of thyroid hormone replacement therapy. she was given various doses of Levothyroxine over the years, currently on 88 micrograms. she reports compliance to this medication but does note she takes it with her other morning medications.  She was also incidentally noted to have  a 2.8 cm thyroid nodule on the left thyroid lobe on 06/06/23 on a CT scan.  I reviewed patient's thyroid tests:  Lab Results  Component Value Date   TSH 0.09 (A) 06/26/2023     Pt describes: - tremors - occasional constipation - temperature fluctuations - hoarseness  Pt denies feeling nodules in neck, dysphagia/odynophagia, SOB with lying down.  she denies know family history of thyroid disorders, no family history of thyroid cancer.  No history of radiation therapy to head or neck.  No recent use of iodine supplements.  Denies use of Biotin containing supplements other than her womens + 50 MVI.  I reviewed her chart and she also has a history of GERD, HTN, DM, CHF, Depression, Scoliosis.   ROS:  Constitutional: no weight gain/loss, no fatigue,+ temperature fluctuations Eyes: no blurry vision, no xerophthalmia ENT: no sore throat, no nodules palpated in throat, no dysphagia/odynophagia, + hoarseness Cardiovascular: no chest pain, no SOB, no palpitations, no leg swelling Respiratory: no cough, no SOB Gastrointestinal: no nausea/vomiting/diarrhea, + intermittent constipation Musculoskeletal: no muscle/joint aches Skin: no rashes Neurological: + tremors-worse in the mornings, no numbness, no tingling, no dizziness Psychiatric: no depression, no anxiety   Objective:   Objective     BP 116/74 (BP Location: Left Arm, Patient Position: Sitting, Cuff Size: Large)   Pulse 76   Ht 5\' 4"  (1.626 m)   Wt 237 lb 12.8 oz (107.9 kg)   BMI 40.82  kg/m  Wt Readings from Last 3 Encounters:  07/25/23 237 lb 12.8 oz (107.9 kg)  07/16/23 236 lb (107 kg)  06/25/23 236 lb (107 kg)    BP Readings from Last 3 Encounters:  07/25/23 116/74  07/16/23 132/62  06/25/23 110/68     Constitutional:  Body mass index is 40.82 kg/m., not in acute distress, normal state of mind Eyes: PERRLA, EOMI, no exophthalmos ENT: moist mucous membranes, no thyromegaly, no cervical  lymphadenopathy Cardiovascular: normal precordial activity, RRR, no murmur/rubs/gallops Respiratory:  adequate breathing efforts, no gross chest deformity, Clear to auscultation bilaterally Gastrointestinal: abdomen soft, non-tender, no distension, bowel sounds present Musculoskeletal: no gross deformities, strength intact in all four extremities Skin: moist, warm, no rashes Neurological: slight tremor with outstretched hands, deep tendon reflexes normal in BLE.   CMP ( most recent) CMP     Component Value Date/Time   NA 142 06/12/2018 1151   K 3.9 06/12/2018 1151   CL 105 06/12/2018 1151   CO2 29 06/12/2018 1151   GLUCOSE 157 (H) 06/12/2018 1151   BUN 17 06/12/2018 1151   CREATININE 1.17 (H) 06/12/2018 1151   CALCIUM 9.7 06/12/2018 1151   PROT 6.6 05/10/2021 1124   GFRNONAA 46 (L) 06/12/2018 1151     Diabetic Labs (most recent): Lab Results  Component Value Date   HGBA1C 6.6 (H) 12/20/2016   HGBA1C 6.7 (H) 08/05/2016     Lipid Panel ( most recent) Lipid Panel  No results found for: "CHOL", "TRIG", "HDL", "CHOLHDL", "VLDL", "LDLCALC", "LDLDIRECT", "LABVLDL"     Lab Results  Component Value Date   TSH 0.09 (A) 06/26/2023      Assessment & Plan:   ASSESSMENT / PLAN:  1. Hypothyroidism-postsurgical   Patient with long-standing hypothyroidism, on levothyroxine therapy. On physical exam, patient  does not have gross goiter, thyroid nodules, or neck compression symptoms.  She is advised to continue Levothyroxine 88 mcg po daily before breakfast.  - We discussed about correct intake of levothyroxine, at fasting, with water, separated by at least 30 minutes from breakfast, and separated by more than 4 hours from calcium, iron, multivitamins, acid reflux medications (PPIs). -Patient is made aware of the fact that thyroid hormone replacement is needed for life, dose to be adjusted by periodic monitoring of thyroid function tests.  - Will check thyroid tests before next  visit: TSH, free T4, and antibodies to help classify her dysfunction.  -Due to incidental finding of 2.8 cm nodule on left thyroid gland on recent CT scan, will order thyroid ultrasound to assess futher.  Will call with labs and ultrasound results and plan moving forward.  - Time spent with the patient: 45 minutes, of which >50% was spent in obtaining information about her symptoms, reviewing her previous labs, evaluations, and treatments, counseling her about her hypothyroidism, and developing a plan to confirm the diagnosis and long term treatment as necessary. Please refer to "Patient Self Inventory" in the Media tab for reviewed elements of pertinent patient history.  Tye Maryland participated in the discussions, expressed understanding, and voiced agreement with the above plans.  All questions were answered to her satisfaction. she is encouraged to contact clinic should she have any questions or concerns prior to her return visit.   FOLLOW UP PLAN:  Return in about 4 weeks (around 08/22/2023) for Thyroid follow up, thyroid ultrasound, Previsit labs.  Ronny Bacon, [facility] [address] [place], [place] [address] Phone: [phone] Fax: [phone]  07/25/2023, 10:42  AM

## 2023-07-30 DIAGNOSIS — E041 Nontoxic single thyroid nodule: Secondary | ICD-10-CM | POA: Diagnosis not present

## 2023-07-30 DIAGNOSIS — E039 Hypothyroidism, unspecified: Secondary | ICD-10-CM | POA: Diagnosis not present

## 2023-07-31 ENCOUNTER — Telehealth: Payer: Self-pay | Admitting: *Deleted

## 2023-07-31 ENCOUNTER — Encounter: Payer: Self-pay | Admitting: Nurse Practitioner

## 2023-07-31 ENCOUNTER — Telehealth: Payer: Self-pay | Admitting: Internal Medicine

## 2023-07-31 ENCOUNTER — Institutional Professional Consult (permissible substitution): Payer: Medicare Other | Admitting: Internal Medicine

## 2023-07-31 DIAGNOSIS — R0609 Other forms of dyspnea: Secondary | ICD-10-CM

## 2023-07-31 LAB — T4, FREE: Free T4: 1.35 ng/dL (ref 0.82–1.77)

## 2023-07-31 LAB — THYROID PEROXIDASE ANTIBODY: Thyroperoxidase Ab SerPl-aCnc: <9 [IU]/mL (ref 0–34)

## 2023-07-31 LAB — TSH: TSH: 0.144 u[IU]/mL — ABNORMAL LOW (ref 0.450–4.500)

## 2023-07-31 LAB — THYROGLOBULIN ANTIBODY: Thyroglobulin Antibody: <1 [IU]/mL (ref 0.0–0.9)

## 2023-07-31 NOTE — Telephone Encounter (Signed)
Pt states Dr. Sherene Sires was going to order a ONO test on her last visit. Nothing in Activity tab.  Please call PT to advise. Cell # is [phone]

## 2023-07-31 NOTE — Progress Notes (Signed)
FYI: I sent mychart message going over recent thyroid labs.

## 2023-07-31 NOTE — Telephone Encounter (Signed)
Noted, that Alphonzo Lemmings has sent the patient a message about her results.

## 2023-07-31 NOTE — Telephone Encounter (Signed)
-----   Message from Dani Gobble sent at 07/31/2023  3:18 PM EDT ----- Lorain Childes: I sent mychart message going over recent thyroid labs

## 2023-08-01 NOTE — Telephone Encounter (Signed)
ONO order has been placed.

## 2023-08-04 ENCOUNTER — Ambulatory Visit (HOSPITAL_COMMUNITY)
Admission: RE | Admit: 2023-08-04 | Discharge: 2023-08-04 | Disposition: A | Discharge status: Home / Self Care | Payer: Medicare Other | Source: Ambulatory Visit | Attending: Nurse Practitioner | Admitting: Nurse Practitioner

## 2023-08-04 DIAGNOSIS — E041 Nontoxic single thyroid nodule: Secondary | ICD-10-CM | POA: Diagnosis not present

## 2023-08-04 DIAGNOSIS — E890 Postprocedural hypothyroidism: Secondary | ICD-10-CM | POA: Insufficient documentation

## 2023-08-04 MED ORDER — LEVOTHYROXINE SODIUM 88 MCG PO TABS: 88.0000 ug | ORAL_TABLET | Freq: Every day | ORAL | 3 refills | Status: DC

## 2023-08-04 NOTE — Addendum Note (Signed)
Addended by: Dani Gobble on: 08/04/2023 07:03 AM   Modules accepted: Orders

## 2023-08-05 ENCOUNTER — Telehealth: Payer: Self-pay | Admitting: *Deleted

## 2023-08-05 ENCOUNTER — Encounter: Payer: Self-pay | Admitting: Nurse Practitioner

## 2023-08-05 DIAGNOSIS — R609 Edema, unspecified: Secondary | ICD-10-CM | POA: Diagnosis not present

## 2023-08-05 DIAGNOSIS — E1165 Type 2 diabetes mellitus with hyperglycemia: Secondary | ICD-10-CM | POA: Diagnosis not present

## 2023-08-05 DIAGNOSIS — E041 Nontoxic single thyroid nodule: Secondary | ICD-10-CM

## 2023-08-05 DIAGNOSIS — U071 COVID-19: Secondary | ICD-10-CM | POA: Diagnosis not present

## 2023-08-05 DIAGNOSIS — M419 Scoliosis, unspecified: Secondary | ICD-10-CM | POA: Diagnosis not present

## 2023-08-05 DIAGNOSIS — E039 Hypothyroidism, unspecified: Secondary | ICD-10-CM | POA: Diagnosis not present

## 2023-08-05 DIAGNOSIS — E876 Hypokalemia: Secondary | ICD-10-CM | POA: Diagnosis not present

## 2023-08-05 DIAGNOSIS — E890 Postprocedural hypothyroidism: Secondary | ICD-10-CM

## 2023-08-05 DIAGNOSIS — R300 Dysuria: Secondary | ICD-10-CM | POA: Diagnosis not present

## 2023-08-05 DIAGNOSIS — I10 Essential (primary) hypertension: Secondary | ICD-10-CM | POA: Diagnosis not present

## 2023-08-05 DIAGNOSIS — Z8619 Personal history of other infectious and parasitic diseases: Secondary | ICD-10-CM | POA: Diagnosis not present

## 2023-08-05 NOTE — Progress Notes (Signed)
FYI: I sent mychart message going over recent thyroid ultrasound results.

## 2023-08-05 NOTE — Telephone Encounter (Signed)
-----   Message from Dani Gobble sent at 08/05/2023  1:08 PM EDT ----- Lorain Childes: I sent mychart message going over recent thyroid ultrasound results.

## 2023-08-05 NOTE — Telephone Encounter (Signed)
Noted that Grace Delacruz has sent the patient a message reviewing her recent thyroid results.

## 2023-08-06 ENCOUNTER — Encounter: Payer: Self-pay | Admitting: Internal Medicine

## 2023-08-06 NOTE — Telephone Encounter (Signed)
PCCs - I do not see any documentation where a message was sent to patient about the ONO order. Any assistance would be appreciated! Thanks!

## 2023-08-07 ENCOUNTER — Institutional Professional Consult (permissible substitution): Payer: Medicare Other | Admitting: Internal Medicine

## 2023-08-11 ENCOUNTER — Encounter (HOSPITAL_COMMUNITY): Payer: Self-pay

## 2023-08-11 ENCOUNTER — Ambulatory Visit (HOSPITAL_COMMUNITY)
Admission: RE | Admit: 2023-08-11 | Discharge: 2023-08-11 | Disposition: A | Discharge status: Home / Self Care | Payer: Medicare Other | Source: Ambulatory Visit | Attending: Nurse Practitioner

## 2023-08-11 DIAGNOSIS — E041 Nontoxic single thyroid nodule: Secondary | ICD-10-CM | POA: Diagnosis not present

## 2023-08-11 MED ORDER — LIDOCAINE HCL (PF) 2 % IJ SOLN
10.0000 mL | Freq: Once | INTRAMUSCULAR | Status: AC
  Administered 2023-08-11: 10 mL

## 2023-08-11 NOTE — Progress Notes (Signed)
PT tolerated thyroid biopsy procedure well today. Labs and afirma obtained and sent for pathology at this time by Lexi from ultrasound. PT ambulatory at discharge with no acute distress noted and verbalized understanding of discharge instructions.

## 2023-08-12 LAB — CYTOLOGY - NON PAP

## 2023-08-14 NOTE — Telephone Encounter (Signed)
Can you schedule patient for follow up in 3 months with labs?  Move out her appt as I think she has one beginning of November.

## 2023-08-14 NOTE — Addendum Note (Signed)
Addended by: Dani Gobble on: 08/14/2023 08:59 AM   Modules accepted: Orders

## 2023-08-21 DIAGNOSIS — E1169 Type 2 diabetes mellitus with other specified complication: Secondary | ICD-10-CM | POA: Diagnosis not present

## 2023-08-21 DIAGNOSIS — E039 Hypothyroidism, unspecified: Secondary | ICD-10-CM | POA: Diagnosis not present

## 2023-08-21 DIAGNOSIS — R42 Dizziness and giddiness: Secondary | ICD-10-CM | POA: Diagnosis not present

## 2023-08-21 DIAGNOSIS — I10 Essential (primary) hypertension: Secondary | ICD-10-CM | POA: Diagnosis not present

## 2023-08-27 ENCOUNTER — Ambulatory Visit: Payer: Medicare Other | Admitting: Internal Medicine

## 2023-08-27 ENCOUNTER — Ambulatory Visit (HOSPITAL_COMMUNITY)
Admission: RE | Admit: 2023-08-27 | Discharge: 2023-08-27 | Disposition: A | Discharge status: Home / Self Care | Payer: Medicare Other | Source: Ambulatory Visit | Attending: Internal Medicine | Admitting: Internal Medicine

## 2023-08-27 ENCOUNTER — Encounter: Payer: Self-pay | Admitting: Internal Medicine

## 2023-08-27 VITALS — BP 135/76 | HR 73 | Ht 64.0 in | Wt 237.0 lb

## 2023-08-27 DIAGNOSIS — R0609 Other forms of dyspnea: Secondary | ICD-10-CM | POA: Diagnosis not present

## 2023-08-27 DIAGNOSIS — R059 Cough, unspecified: Secondary | ICD-10-CM | POA: Diagnosis not present

## 2023-08-27 NOTE — Assessment & Plan Note (Addendum)
Onset with covid 19 pna  06/06/2023 > walking sats ok at d/c 06/12/23  - echo 06/09/23  1. The left ventricle is normal in size with mildly increased wall  thickness.   2. The left ventricular systolic function is normal, LVEF is visually  estimated at > 55%.    3. Mitral annular calcification is present.    4. The aortic valve is trileaflet with mildly thickened leaflets with normal  excursion.   5. The left atrium is mildly dilated in size.    6. The right ventricle is normal in size, with normal systolic function.  - 07/16/2023   Walked on RA  x  1  lap(s) =  approx 250  ft  @ slow/ slt unsteady pace, stopped due to hip pain/ balance issues s sob with lowest 02 sats 94%  - 08/27/2023   Walked on RA   x  one   lap(s) =  approx 150  ft  @ slow  pace, stopped due to fatigue/unsteadiness/back pain and  with lowest 02 sats 94%    Clearly not limited by a pulmonary problem but rather othopedic/ MO/deconditioning at this point   ? Component of noct pseudowheeze related to gerd > rx diet and add pepcid 20 mg p supper .  Rec sub max ex up to 30 min daily and check sats at peak ex  Check ono on RA to complete the w/u   F/u p holidays if not back to baseline and truly limited by dyspnea, which did not appear to be the case today.                   Marland Kitchen

## 2023-08-27 NOTE — Patient Instructions (Addendum)
Try lopressor 100 mg one half in am and pm   Add pepcid 20 mg taken after supper (over the counter)   GERD (REFLUX)  is an extremely common cause of respiratory symptoms just like yours , many times with no obvious heartburn at all.    It can be treated with medication, but also with lifestyle changes including elevation of the head of your bed (ideally with 6 -8inch blocks under the headboard of your bed),  Smoking cessation, avoidance of late meals, excessive alcohol, and avoid fatty foods, chocolate, peppermint, colas, red wine, and acidic juices such as orange juice.  NO MINT OR MENTHOL PRODUCTS SO NO COUGH DROPS  USE SUGARLESS CANDY INSTEAD (Jolley ranchers or Stover's or Life Savers) or even ice chips will also do - the key is to swallow to prevent all throat clearing. NO OIL BASED VITAMINS - use powdered substitutes.  Avoid fish oil when coughing.    Please remember to go to the  x-ray department  @  [facility] for your tests - we will call you with the results when they are available      To get the most out of exercise, you need to be continuously aware that you are short of breath, but never out of breath, for at least 30 minutes daily. As you improve, it will actually be easier for you to do the same amount of exercise  in  30 minutes so always push to the level where you are short of breath.     Make sure you check your oxygen saturations at highest level of activity    If not improved to your satisfaction return after the holidays for re-evaluation     .

## 2023-08-27 NOTE — Assessment & Plan Note (Signed)
Body mass index is 40.68 kg/m.  -  Lab Results  Component Value Date   TSH 0.144 (L) 07/30/2023      Contributing to doe and risk of GERD/dvt/PE >>>   reviewed the need and the process to achieve and maintain neg calorie balance > defer f/u primary care including intermittently monitoring thyroid status     Each maintenance medication was reviewed in detail including emphasizing most importantly the difference between maintenance and prns and under what circumstances the prns are to be triggered using an action plan format where appropriate.  Total time for H and P, chart review, counseling,  directly observing portions of ambulatory 02 saturation study/ and generating customized AVS unique to this office visit / same day charting > 

## 2023-08-27 NOTE — Progress Notes (Signed)
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 female who presented to the ED on 8/17 with diagnosis of COVID 19 pneumonia. Initial troponin 330, trended up to 689, then back down to 555. [facility] cardiology was consulted who did not feel the elevated troponin was indicative of ACS or NSTEMI. Patient was placed on BiPAP due to increased work of breathing and tachypnea and was treated with IV Lasix. Admitted to ICU for treatment and management. She was aggressively diuresed and was moved out of ICU on 8/20 as she continued to improve. Oxygen was gradually weaned. Oxygen testing 8/21 showed no need for exertional oxygen. Today she is feeling well and feels she is ready for discharge home.      COVID-19 virus infection, pneumonia of both lungs due to infectious organism, acute hypoxic respiratory failure Chest x-ray 8/16 shows right greater than left airspace disease and consolidation suspicious for pneumonia CTA rules out PE Was started on vancomycin and cefepime, de-escalated to Cipro 8/19 MRSA screening negative White blood count 13.3, patient is receiving steroids IV She is afebrile  Requiring only 1 L of oxygen Encouraged incentive spirometer and Acapella Neb treatments, mucinex, remdesivir day 5, vitamin regimen Repeat chest x ray 8/21 shows no abnormalities, fluid/edema has cleared 6 min walk test 8/21 shows no need for oxygen supplementation  2. Sepsis with organ dysfunction Due to COVID-19  pneumonia As evidenced by need for BiPAP, creatinine 1.5 times baseline, white blood count 20.3 initially Hypotensive, tachynpeic Blood cultures negative Sepsis resolved  3. Elevated troponins Thought to be due to demand, patient with fluid overload upon arrival Cardiology followed along while admitted  Troponins have trended down Patient is not having chest pain  4. HTN BP high at times while admitted Prn hydralazine in place Toprol home dose resumed, Norvasc dosage increased from 2.5mg  daily to 5mg  daily  5. Type 2 diabetes SSI, Lantus 5 units nightly while admitted Glucose 182-251 in the past 24 hours A1C 6.0 Resume Ozempic, home regimen at discharge  6. AKI Creatinine 1.59 8/18, down to 0.99 today Has been getting IV Lasix, to change to PO home dosage tomorrow Output has been excellent  7. Acute cystitis Urine culture shows E. coli sensitive to Cipro Changed IV antibiotics to Cipro due to cephalexin allergy Received 2 days of cefepime, today is day 6 of treatment Patient has frequent UTIs Continue 4 additional days of Cipro outpatient, rx electronically sent to [facility] in [place]  8. Fluid overload Resolved Received aggressive IV diuresis Strict intake and output shows excellent output Updated echo 8/20, showed preserved EF of >55%    History of Present Illness  07/16/2023  Pulmonary/ 1st office eval/Grace Delacruz  Chief Complaint  Patient presents with   Establish Care    Abn pulm imaging  Baseline ex slower pace last several years but could  shop for at least 30 min at s stopping due to doe  PTA Dyspnea:  physical therapy twice a week plus ex her  own but not checking sats while ex   Cough: resolved  Sleep: 10-15 degrees HOB new setting since covid  SABA use: none Rec My office will be contacting you by phone for referral for Overnight 02 on Room air   - if you don't hear back from my office within one week please call us back or notify us thru MyChart and we'll  address it right away.  To get the most out of exercise, you need to be continuously aware that you are short of breath, but never out of breath, for at least 30 minutes daily  Make sure you check your oxygen saturations at highest level of activity       08/27/2023  f/u ov/[place] office/Grace Delacruz re: post covid maint on RA   / no resp rx Chief Complaint  Patient presents with   Shortness of Breath  Dyspnea:  whole store including [employer] / [employer] s problem pushing cart and sat 94% s limited doe / legs get tired @ wt same as baseline  Cough: occ noct cough with your usual wheeze  Sleeping: 10 degrees s    resp cc  SABA use: none  02: none     No obvious day to day or daytime variability or assoc excess/ purulent sputum or mucus plugs or hemoptysis or cp or chest tightness, subjective wheeze or overt sinus or hb symptoms.    Also denies any obvious fluctuation of symptoms with weather or environmental changes or other aggravating or alleviating factors except as outlined above   No unusual exposure hx or h/o childhood pna/ asthma or knowledge of premature birth.  Current Allergies, Complete Past Medical History, Past Surgical History, Family History, and Social History were reviewed in [employer] record.  ROS  The following are not active complaints unless bolded Hoarseness, sore throat, dysphagia, dental problems, itching, sneezing,  nasal congestion or discharge of excess mucus or purulent secretions, ear ache,   fever, chills, sweats, unintended wt loss or wt gain, classically pleuritic or exertional cp,  orthopnea pnd or arm/hand swelling  or leg swelling, presyncope, palpitations, abdominal pain, anorexia, nausea, vomiting, diarrhea  or change in bowel habits or change in bladder habits, change in stools or change in urine, dysuria, hematuria,  rash, arthralgias, visual complaints, headache, numbness, weakness or ataxia or problems with walking or coordination,   change in mood or  memory.        Current Meds  Medication Sig   aspirin EC 81 MG tablet Take 81 mg by mouth at bedtime.    Coenzyme Q10 (COQ-10) 100 MG CAPS Take 1 capsule by mouth daily.   docusate sodium (COLACE) 100 MG capsule Take 200 mg by mouth at bedtime.   FLUoxetine (PROZAC) 20 MG capsule Take 20 mg by mouth every morning.    furosemide (LASIX) 40 MG tablet Take 40 mg by mouth daily.   gabapentin (NEURONTIN) 100 MG capsule Take 100 mg by mouth 2 (two) times daily.   levothyroxine (SYNTHROID) 88 MCG tablet Take 1 tablet (88 mcg total) by mouth daily.   loratadine (CLARITIN) 10 MG tablet Take 10 mg by mouth daily.   losartan (COZAAR) 100 MG tablet Take 1 tablet (100 mg total) by mouth every evening.   meclizine (ANTIVERT) 25 MG tablet Take 12.5 mg by mouth at bedtime as needed.   metoprolol succinate (TOPROL-XL) 100 MG 24 hr tablet Take 100 mg by mouth every morning. Take with or immediately following a meal.  Multiple Vitamin (MULTIVITAMIN) tablet Take 1 tablet by mouth daily.   omeprazole (PRILOSEC) 20 MG capsule Take 20 mg by mouth daily before breakfast.   OZEMPIC, 0.25 OR 0.5 MG/DOSE, 2 MG/1.5ML SOPN Inject 1 mL into the skin once a week.   potassium chloride (KLOR-CON) 10 MEQ tablet Take 10 mEq by mouth daily.   simvastatin (ZOCOR) 80 MG tablet Take 1 tablet by mouth at bedtime.   triamcinolone cream (KENALOG) 0.5 % Apply 1 application topically 3 (three) times daily as needed. For rash/irritation.   vitamin C (ASCORBIC ACID) 500 MG tablet Take 500 mg by mouth daily.   Vitamin D, Cholecalciferol, 1000 UNITS CAPS Take 1,000 mg by mouth 2 (two) times daily.           Past Medical History:  Diagnosis Date   Anxiety    Depression    DJD (degenerative joint disease)    Esophageal reflux    Essential hypertension    Fibrocystic breast disease    Hypothyroidism    Left bundle branch block    Transient - possibly rate related    Menopause    Osteoarthritis    PONV  (postoperative nausea and vomiting)    pt. would like to have a scopolamine patch   Restless leg syndrome    Scoliosis    Type 2 diabetes mellitus (HCC)       Objective:     Wt Readings from Last 3 Encounters:  08/27/23 237 lb (107.5 kg)  07/25/23 237 lb 12.8 oz (107.9 kg)  07/16/23 236 lb (107 kg)    Vital signs reviewed  08/27/2023  - Note at rest 02 sats  96% on RA   General appearance:    amb MO (by BMI) amb  wf nad        HEENT : Oropharynx  clear      Nasal turbinates nl    NECK :  without  apparent JVD/ palpable Nodes/TM    LUNGS: no acc muscle use,  Kyphoscoliotic contour chest which is clear to A and P bilaterally without cough on insp or exp maneuvers   CV:  RRR  no s3 or murmur or increase in P2, and  trace bilateral ankle  edema   ABD:  soft and nontender    MS:  Nl gait/ ext warm without deformities Or obvious joint restrictions  calf tenderness, cyanosis or clubbing    SKIN: warm and dry without lesions    NEURO:  alert, approp, nl sensorium with  no motor or cerebellar deficits apparent.         CXR PA and Lateral:   08/27/2023 :    I personally reviewed images and impression is as follows:     Mod kyphoscoliosis, no active cardiopulmonary findings     Assessment

## 2023-08-28 DIAGNOSIS — R0902 Hypoxemia: Secondary | ICD-10-CM | POA: Diagnosis not present

## 2023-08-28 DIAGNOSIS — G4730 Sleep apnea, unspecified: Secondary | ICD-10-CM | POA: Diagnosis not present

## 2023-08-29 ENCOUNTER — Ambulatory Visit: Payer: Medicare Other | Admitting: Nurse Practitioner

## 2023-08-29 ENCOUNTER — Ambulatory Visit: Payer: Medicare Other | Admitting: Internal Medicine

## 2023-09-04 ENCOUNTER — Other Ambulatory Visit: Payer: Self-pay

## 2023-09-04 ENCOUNTER — Encounter: Payer: Self-pay | Admitting: Internal Medicine

## 2023-09-04 DIAGNOSIS — R0609 Other forms of dyspnea: Secondary | ICD-10-CM

## 2023-09-04 DIAGNOSIS — G4734 Idiopathic sleep related nonobstructive alveolar hypoventilation: Secondary | ICD-10-CM | POA: Insufficient documentation

## 2023-09-05 ENCOUNTER — Other Ambulatory Visit: Payer: Self-pay

## 2023-09-05 DIAGNOSIS — R0609 Other forms of dyspnea: Secondary | ICD-10-CM

## 2023-09-08 ENCOUNTER — Telehealth: Payer: Self-pay | Admitting: Internal Medicine

## 2023-09-08 DIAGNOSIS — G4734 Idiopathic sleep related nonobstructive alveolar hypoventilation: Secondary | ICD-10-CM

## 2023-09-08 NOTE — Telephone Encounter (Signed)
Patient received call from [employer] on Friday saying that she needs oxygen. She was unaware of this and would like for someone to call her to explain.

## 2023-09-08 NOTE — Telephone Encounter (Signed)
ONO results received. Patient needs 2L O2 QHS. Per Dr. Sherene Sires would like ONO repeated on 2L to ensure adequate sats.   Spoke with patient and advised. Nothing further needed at this time.

## 2023-09-08 NOTE — Telephone Encounter (Signed)
Per chart O2 was ordered on 09/04/23. I cannot locate any ONO result notes or other indication as to why order was placed. LOV does not indicate need for O2.  Please advise, thank you!

## 2023-09-08 NOTE — Telephone Encounter (Signed)
From my problem list entry at last ov  Check ono on RA to complete the w/u   She did not desaturate with walking but I rec check it sleeping - if doing fine this isn't really needed but I did not order any 02 , just the testing to be sure she doesn't need any 02 at hs.

## 2023-09-10 DIAGNOSIS — L821 Other seborrheic keratosis: Secondary | ICD-10-CM | POA: Diagnosis not present

## 2023-09-10 DIAGNOSIS — L570 Actinic keratosis: Secondary | ICD-10-CM | POA: Diagnosis not present

## 2023-09-16 NOTE — Addendum Note (Signed)
Addended by: Dani Gobble on: 09/16/2023 07:28 AM   Modules accepted: Orders

## 2023-09-22 DIAGNOSIS — M810 Age-related osteoporosis without current pathological fracture: Secondary | ICD-10-CM | POA: Diagnosis not present

## 2023-09-22 DIAGNOSIS — Z780 Asymptomatic menopausal state: Secondary | ICD-10-CM | POA: Diagnosis not present

## 2023-10-02 ENCOUNTER — Ambulatory Visit: Payer: Medicare Other | Admitting: Nurse Practitioner

## 2023-10-06 DIAGNOSIS — G4730 Sleep apnea, unspecified: Secondary | ICD-10-CM | POA: Diagnosis not present

## 2023-10-06 DIAGNOSIS — R0902 Hypoxemia: Secondary | ICD-10-CM | POA: Diagnosis not present

## 2023-10-09 DIAGNOSIS — R490 Dysphonia: Secondary | ICD-10-CM | POA: Diagnosis not present

## 2023-10-09 DIAGNOSIS — E041 Nontoxic single thyroid nodule: Secondary | ICD-10-CM | POA: Diagnosis not present

## 2023-10-09 NOTE — Progress Notes (Deleted)
No chief complaint on file.    HISTORY OF PRESENT ILLNESS:  10/09/23 ALL:  Grace Delacruz returns for follow up. She was last seen 05/2022 and doing well on gabapentin. We discussed separating dose and trial of 300mg  in evening and 300mg  at bedtime to see if this helped with morning dizziness. Since,   She was admitted 05/2023 for acute CHF and pneumonia with sepsis in setting of COVID and UTI. She was diuresed over 20lbs, treated with antivirals and abx and discharged home with [facility].   100mg  BID?   06/12/2022 ALL:  Grace Delacruz returns for follow up for vertigo, headaches and foot numbness. She was unable to tolerate imipramine at last visit 11/2021 and was switched to gabapentin. She is taking 600mg  daily at bedtime. She reports headaches are better. Neuropathy is stable. She does endorse more dizziness. She reports dizziness is worse in the mornings. She feels that her gait is off balance. She does report worsening with movement of head side to side. She feels dizziness can be worse in the mornings. Meclizine does help. She has tried the Epley maneuver that has also helped.   12/17/2021 ALL: Grace Delacruz is a 77 y.o. female here today for follow up for vertigo, headaches, and foot numbness.   She was last seen by Dr Epimenio Foot 07/2021 and started on imipramine 10mg  QHS for headaches, dysesthesias, nocturia and insomnia. She feels that it has helped headaches but not certain it has helped with neuropathy discomfort. She continues to have sharp pain in feet, especially at night. She denies changes to gait. She denies falls. She continues to have difficulty getting to and staying asleep. She has noted significant constipation over the past 2 months. Vertigo is stable.   HISTORY (copied from Dr Bonnita Hollow previous note)  Grace Delacruz is a 77 y.o. woman with  vertigo, headaches and gait disturbance and foot numbness   UPDATE10/20/2022: The MRI of the brain showed atrophy and chronic microvascular ischemic  changes.  MRI of the cervical spine showed a normal spinal cord.  No spinal stenosis.  She has mild DJD.  Nothing was seen on either scan to explain her gait issues.  She has painful numbness in her fingers and toes.    She has NIDDM (since 2007).  She notes the numbness more when she is resting.  At times it can become very painful and other times it seems milder   At the last visit, we did an occipital nerve block/splenius capitus TPI for occipital neuralgia.  She felt she got 5 to 6 weeks of benefit and then symptoms have slowly returned but are not as bad now as they were at the last visit   She has lost 40 pounds on Ozempic for DM.   She has had intermittent vertigo since 2012 but notes several episodes of  lightheadedness this year. Usually she has been standing a while, frst time while shopping.   She feels her balance has gradually worsened.    She fell twice the last 2 months, once going down steps and once tripping over a small step going into a restaurant.    Onfall she broke her humerus.    She denies weakness in her legs.    She needs to hold the bannister on stairs (has x many years).     She saw ENT and was told hearing was fine.    She had VNG at [facility] c/w benign paroxysmal positional vertigo.    Epley maneuvers have helped.  She has urinary frequency and nocturia x 3.     Vascular risks:  She has well controlled essential hypertension.   Never smoked.   She has Type 2 NIDDM > 10 years.     Images personally reviewed.   MRI brain 06/03/2019 showed age-appropriate mild generalized cortical atrophy, scattered T2/FLAIR hyperintense foci consistent with mild chronic microvascular ischemic change and an arachnoid cyst in the left middle fossa.  There were no acute findings.  Vertebral arteries are diminutive and the left terminates as the left posterior inferior cerebellar artery.  Posterior communicating arteries are present.   MRI [facility] 05/23/2021 showed  The internal auditory canals  appear normal.     Scattered T2/FLAIR hyperintense foci in the hemispheres consistent with mild to moderate chronic microvascular ischemic change, unchanged compared to the previous MRI.   Mild generalized cortical atrophy, typical for age..   No acute findings.  Normal enhancement pattern.   MRI cervical spine 05/23/2021 showed    Mild degenerative changes at C3-C4 through C6-C7 that do not lead to nerve root compression or spinal stenosis.   The spinal cord is normal signal..   Asymmetric thyroid gland.  There appears to be surgical resection of the right hemithyroid and there is heterogenous signal in the left hemithyroid.   LABS: 05/10/2021:    B12, SPEP/IEF, ssa/ssb, RF were normal    REVIEW OF SYSTEMS: Out of a complete 14 system review of symptoms, the patient complains only of the following symptoms, dizziness, numbness and sharp pains of feet, headaches, dizziness and all other reviewed systems are negative.   ALLERGIES: Allergies  Allergen Reactions   Ephedrine Palpitations    Heart rate fast-   Hibiclens [Chlorhexidine Gluconate] Rash    SKIN REDNESS AND BURNING SENSATION    NO CHG   Keflex [Cephalexin] Itching and Rash     HOME MEDICATIONS: Outpatient Medications Prior to Visit  Medication Sig Dispense Refill   aspirin EC 81 MG tablet Take 81 mg by mouth at bedtime.      Coenzyme Q10 (COQ-10) 100 MG CAPS Take 1 capsule by mouth daily.     docusate sodium (COLACE) 100 MG capsule Take 200 mg by mouth at bedtime.     FLUoxetine (PROZAC) 20 MG capsule Take 20 mg by mouth every morning.      furosemide (LASIX) 40 MG tablet Take 40 mg by mouth daily.     gabapentin (NEURONTIN) 100 MG capsule Take 100 mg by mouth 2 (two) times daily.     levothyroxine (SYNTHROID) 88 MCG tablet Take 1 tablet (88 mcg total) by mouth daily. 90 tablet 3   loratadine (CLARITIN) 10 MG tablet Take 10 mg by mouth daily.     losartan (COZAAR) 100 MG tablet Take 1 tablet (100 mg total) by mouth every  evening.     meclizine (ANTIVERT) 25 MG tablet Take 12.5 mg by mouth at bedtime as needed.     metoprolol succinate (TOPROL-XL) 100 MG 24 hr tablet Take 100 mg by mouth every morning. Take with or immediately following a meal.     Multiple Vitamin (MULTIVITAMIN) tablet Take 1 tablet by mouth daily.     omeprazole (PRILOSEC) 20 MG capsule Take 20 mg by mouth daily before breakfast.     OZEMPIC, 0.25 OR 0.5 MG/DOSE, 2 MG/1.5ML SOPN Inject 1 mL into the skin once a week.     potassium chloride (KLOR-CON) 10 MEQ tablet Take 10 mEq by mouth daily.     simvastatin (  ZOCOR) 80 MG tablet Take 1 tablet by mouth at bedtime.     triamcinolone cream (KENALOG) 0.5 % Apply 1 application topically 3 (three) times daily as needed. For rash/irritation.     vitamin C (ASCORBIC ACID) 500 MG tablet Take 500 mg by mouth daily.     Vitamin D, Cholecalciferol, 1000 UNITS CAPS Take 1,000 mg by mouth 2 (two) times daily.     No facility-administered medications prior to visit.     PAST MEDICAL HISTORY: Past Medical History:  Diagnosis Date   Anxiety    Depression    DJD (degenerative joint disease)    Esophageal reflux    Essential hypertension    Fibrocystic breast disease    Hypothyroidism    Left bundle branch block    Transient - possibly rate related    Menopause    Osteoarthritis    PONV (postoperative nausea and vomiting)    pt. would like to have a scopolamine patch   Restless leg syndrome    Scoliosis    Type 2 diabetes mellitus (HCC)      PAST SURGICAL HISTORY: Past Surgical History:  Procedure Laterality Date   BREAST BIOPSY     lt x3   CARPOMETACARPEL SUSPENSION PLASTY Left 03/08/2014   Procedure: LEFT THUMB CARPOMETACARPEL (CMC) SUSPENSION PLASTY WITH TRAPEZIUM EXCISION;  Surgeon: Wyn Forster., MD;  Location:  SURGERY CENTER;  Service: Orthopedics;  Laterality: Left;   CATARACT EXTRACTION Bilateral    CESAREAN SECTION     CHOLECYSTECTOMY     COLONOSCOPY  03/26/2007    RMR:  Diffuse changes of proctocolitis as described above diminutive rectal polyp, cold biopsy/removed, pedunculated polyp of the splenic flexure hot snare removed.  Segmental biopsies the colon taken of the ileocecal valve ulcer biopsied normal terminal ileum   COLONOSCOPY N/A 01/05/2014   Procedure: COLONOSCOPY;  Surgeon: Malissa Hippo, MD;  Location: [facility];  Service: Endoscopy;  Laterality: N/A;  830   EYE SURGERY     GASTROCNEMIUS RECESSION Right 08/07/2016   Procedure: GASTROCNEMIUS RECESSION;  Surgeon: Nadara Mustard, MD;  Location: [facility] OR;  Service: Orthopedics;  Laterality: Right;   Joint fusion-foot     x2 LEFT FOOT   KNEE ARTHROPLASTY  2001   Right   KNEE ARTHROPLASTY  2002   Left   LEFT HEART CATH AND CORONARY ANGIOGRAPHY N/A 06/18/2018   Procedure: LEFT HEART CATH AND CORONARY ANGIOGRAPHY;  Surgeon: Kathleene Hazel, MD;  Location: [facility] INVAS[facility] CV [facility];  Service: Cardiovascular;  Laterality: N/A;   Left leg vein resection     RETINAL DETACHMENT SURGERY Left 02/25/2022   done in [facility]   ROTATOR CUFF REPAIR     LEFT   THYROIDECTOMY  1980   3/4   TONSILLECTOMY     TUBAL LIGATION     WEIL OSTEOTOMY Right 08/07/2016   Procedure: WEIL OSTEOTOMY 2nd and 3rd Metatarsal Right Foot;  Surgeon: Nadara Mustard, MD;  Location: [facility] OR;  Service: Orthopedics;  Laterality: Right;   WEIL OSTEOTOMY Left 12/25/2016   Procedure: 2nd Toe Proximal Interphalangeal Resection, Weil Osteotomy 2nd and 3rd Metatarsal Left Foot;  Surgeon: Nadara Mustard, MD;  Location: [facility] OR;  Service: Orthopedics;  Laterality: Left;     FAMILY HISTORY: Family History  Problem Relation Age of Onset   Hypertension Mother    Heart failure Father        Died with pneumonia   Hypertension Father    Arthritis Father  Diabetes Father    COPD Brother    Asthma Paternal Grandfather    Emphysema Paternal Grandfather    Colon cancer Neg Hx    Esophageal cancer Neg Hx    Stomach cancer Neg Hx    Colon  polyps Neg Hx      SOCIAL HISTORY: Social History   Socioeconomic History   Marital status: Married    Spouse name: Not on file   Number of children: 1   Years of education: Not on file   Highest education level: Not on file  Occupational History   Occupation: retired  Tobacco Use   Smoking status: Never   Smokeless tobacco: Never  Vaping Use   Vaping status: Never Used  Substance and Sexual Activity   Alcohol use: Yes    Alcohol/week: 0.0 standard drinks of alcohol    Comment: rare-- glass of wine at christmas   Drug use: No   Sexual activity: Not on file  Other Topics Concern   Not on file  Social History Narrative   Not on file   Social Drivers of Health   Financial Resource Strain: Low Risk  (06/10/2023)   Received from [facility]   Overall Financial Resource Strain (CARDIA)    Difficulty of Paying Living Expenses: Not hard at all  Food Insecurity: No Food Insecurity (06/10/2023)   Received from [facility]   Hunger Vital Sign    Worried About Running Out of Food in the Last Year: Never true    Ran Out of Food in the Last Year: Never true  Transportation Needs: No Transportation Needs (06/10/2023)   Received from [facility] - Transportation    Lack of Transportation (Medical): No    Lack of Transportation (Non-Medical): No  Physical Activity: Not on file  Stress: Not on file  Social Connections: Not on file  Intimate Partner Violence: Not At Risk (06/10/2023)   Received from [facility]   Humiliation, Afraid, Rape, and Kick questionnaire    Fear of Current or Ex-Partner: No    Emotionally Abused: No    Physically Abused: No    Sexually Abused: No     PHYSICAL EXAM  There were no vitals filed for this visit.   There is no height or weight on file to calculate BMI.  Generalized: Well developed, in no acute distress  Cardiology: normal rate and rhythm, no murmur auscultated  Respiratory: clear to auscultation bilaterally     Neurological examination  Mentation: Alert oriented to time, place, history taking. Follows all commands speech and language fluent Cranial nerve II-XII: Pupils were equal round reactive to light. Extraocular movements were full, visual field were full on confrontational test. Facial sensation and strength were normal. Head turning and shoulder shrug  were normal and symmetric. Motor: The motor testing reveals 5 over 5 strength of all 4 extremities. Good symmetric motor tone is noted throughout.  Sensory: Sensory testing is intact to soft touch on all 4 extremities. No evidence of extinction is noted.  Gait and station: Gait is normal.    DIAGNOSTIC DATA (LABS, IMAGING, TESTING) - I reviewed patient records, labs, notes, testing and imaging myself where available.  Lab Results  Component Value Date   WBC 7.2 06/12/2018   HGB 14.4 06/12/2018   HCT 42.6 06/12/2018   MCV 90.1 06/12/2018   PLT 223 06/12/2018      Component Value Date/Time   NA 142 06/12/2018 1151   K  3.9 06/12/2018 1151   CL 105 06/12/2018 1151   CO2 29 06/12/2018 1151   GLUCOSE 157 (H) 06/12/2018 1151   BUN 17 06/12/2018 1151   CREATININE 1.17 (H) 06/12/2018 1151   CALCIUM 9.7 06/12/2018 1151   PROT 6.6 05/10/2021 1124   GFRNONAA 46 (L) 06/12/2018 1151   GFRAA 53 (L) 06/12/2018 1151   No results found for: "CHOL", "HDL", "LDLCALC", "LDLDIRECT", "TRIG", "CHOLHDL" Lab Results  Component Value Date   HGBA1C 6.6 (H) 12/20/2016   Lab Results  Component Value Date   VITAMINB12 679 05/10/2021   Lab Results  Component Value Date   TSH 0.144 (L) 07/30/2023        No data to display               No data to display           ASSESSMENT AND PLAN  77 y.o. year old female  has a past medical history of Anxiety, Depression, DJD (degenerative joint disease), Esophageal reflux, Essential hypertension, Fibrocystic breast disease, Hypothyroidism, Left bundle branch block, Menopause, Osteoarthritis,  PONV (postoperative nausea and vomiting), Restless leg syndrome, Scoliosis, and Type 2 diabetes mellitus (HCC). here with    No diagnosis found.  Ka reports that headaches and neuropathy are improved and stable. We will continue gabapentin up to 600mg  daily. Side effects reviewed. She may consider taking 300mg  early afternoon and 300mg  at bedtime to see if this helps with dizziness. I will send her to PT for vestibular therapy. She will continue close follow up with PCP and cardiology. She will focus on healthy lifestyle habits and sleep hygiene. Close follow up with PCP advised. She will return to see me in 6 months, sooner if needed.    No orders of the defined types were placed in this encounter.    No orders of the defined types were placed in this encounter.     Shawnie Dapper, MSN, FNP-C 10/09/2023, 3:12 PM  [facility] [address], [address] [place], [place] [address] [phone]

## 2023-10-13 ENCOUNTER — Ambulatory Visit: Payer: Medicare Other | Admitting: Family Medicine

## 2023-10-20 ENCOUNTER — Telehealth: Payer: Self-pay | Admitting: Internal Medicine

## 2023-10-20 NOTE — Telephone Encounter (Signed)
Pt wasn't know her results , results are scan in her chart under media ,please advise

## 2023-10-20 NOTE — Telephone Encounter (Signed)
Result and plan are in the overview box for noct hypoxemia:  ONO on Aug 28 2023  desat x 2 h 8 min @ < 89%  >  09/08/2023   rec 2lpm and repeat on 2lpm

## 2023-10-20 NOTE — Telephone Encounter (Signed)
Patient states she had an over night pulse ox 2 weeks ago and has not gotten the results----[phone]

## 2023-10-21 ENCOUNTER — Encounter: Payer: Self-pay | Admitting: Internal Medicine

## 2023-10-21 ENCOUNTER — Ambulatory Visit: Payer: Medicare Other | Admitting: Internal Medicine

## 2023-10-21 VITALS — BP 133/63 | HR 73 | Ht 64.0 in | Wt 243.3 lb

## 2023-10-21 DIAGNOSIS — R053 Chronic cough: Secondary | ICD-10-CM

## 2023-10-21 DIAGNOSIS — G4734 Idiopathic sleep related nonobstructive alveolar hypoventilation: Secondary | ICD-10-CM | POA: Diagnosis not present

## 2023-10-21 DIAGNOSIS — R0609 Other forms of dyspnea: Secondary | ICD-10-CM

## 2023-10-21 MED ORDER — FAMOTIDINE 20 MG PO TABS: ORAL_TABLET | ORAL | 11 refills | Status: AC

## 2023-10-21 MED ORDER — PANTOPRAZOLE SODIUM 40 MG PO TBEC: 40.0000 mg | DELAYED_RELEASE_TABLET | Freq: Every day | ORAL | 2 refills | Status: DC

## 2023-10-21 NOTE — Patient Instructions (Addendum)
 Pantoprazole  (protonix ) 40 mg   Take  30-60 min before first meal of the day and Pepcid  (famotidine )  20 mg after supper until return to office x  at least 6 weeks and if choking and hoarseness continue will need ENT evaluation next   GERD (REFLUX)  is an extremely common cause of respiratory symptoms just like yours , many times with no obvious heartburn at all.    It can be treated with medication, but also with lifestyle changes including elevation of the head of your bed (ideally with 6 -8inch blocks under the headboard of your bed),  Smoking cessation, avoidance of late meals, excessive alcohol , and avoid fatty foods, chocolate, peppermint, colas, red wine, and acidic juices such as orange juice.  NO MINT OR MENTHOL PRODUCTS SO NO COUGH DROPS  USE SUGARLESS CANDY INSTEAD (Jolley ranchers or Stover's or Life Savers) or even ice chips will also do - the key is to swallow to prevent all throat clearing. NO OIL BASED VITAMINS - use powdered substitutes.  Avoid fish oil when coughing.   My office will be contacting you by phone for referral to sleep medicine evaluation  in [place]   - if you don't hear back from my office within one week please call us  back or notify us  thru MyChart and we'll address it right away.   Make sure you check your oxygen saturation at your highest level of activity(NOT after you stop)  to be sure it stays over 90% and keep track of it at least once a week, more often if breathing getting worse, and let me know if losing ground. (Collect the dots to connect the dots approach)    We will find out what happened to your oxygen levels while sleeping on 02 on Dec 16 but for now please continue same level  Pulmonary clinic follow up here is as needed

## 2023-10-21 NOTE — Progress Notes (Signed)
 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 female who presented to the ED on 8/17 with diagnosis of COVID 19 pneumonia. Initial troponin 330, trended up to 689, then back down to 555. [facility] cardiology was consulted who did not feel the elevated troponin was indicative of ACS or NSTEMI. Patient was placed on BiPAP due to increased work of breathing and tachypnea and was treated with IV Lasix. Admitted to ICU for treatment and management. She was aggressively diuresed and was moved out of ICU on 8/20 as she continued to improve. Oxygen was gradually weaned. Oxygen testing 8/21 showed no need for exertional oxygen. Today she is feeling well and feels she is ready for discharge home.      COVID-19 virus infection, pneumonia of both lungs due to infectious organism, acute hypoxic respiratory failure Chest x-ray 8/16 shows right greater than left airspace disease and consolidation suspicious for pneumonia CTA rules out PE Was started on vancomycin  and cefepime, de-escalated to Cipro 8/19 MRSA screening negative White blood count 13.3, patient is receiving steroids IV She is afebrile  Requiring only 1 L of oxygen Encouraged incentive spirometer and Acapella Neb treatments, mucinex, remdesivir  day 5, vitamin regimen Repeat chest x ray 8/21 shows no abnormalities, fluid/edema has cleared 6 min walk test 8/21 shows no need for oxygen supplementation  2. Sepsis with organ dysfunction Due to COVID-19  pneumonia As evidenced by need for BiPAP, creatinine 1.5 times baseline, white blood count 20.3 initially Hypotensive, tachynpeic Blood cultures negative Sepsis resolved  3. Elevated troponins Thought to be due to demand, patient with fluid overload upon arrival Cardiology followed along while admitted  Troponins have trended down Patient is not having chest pain  4. HTN BP high at times while admitted Prn hydralazine in place Toprol home dose resumed, Norvasc  dosage increased from 2.5mg  daily to 5mg  daily  5. Type 2 diabetes SSI, Lantus 5 units nightly while admitted Glucose 182-251 in the past 24 hours A1C 6.0 Resume Ozempic, home regimen at discharge  6. AKI Creatinine 1.59 8/18, down to 0.99 today Has been getting IV Lasix, to change to PO home dosage tomorrow Output has been excellent  7. Acute cystitis Urine culture shows E. coli sensitive to Cipro Changed IV antibiotics to Cipro due to cephalexin allergy Received 2 days of cefepime, today is day 6 of treatment Patient has frequent UTIs Continue 4 additional days of Cipro outpatient, rx electronically sent to [facility] in [place]  8. Fluid overload Resolved Received aggressive IV diuresis Strict intake and output shows excellent output Updated echo 8/20, showed preserved EF of >55%    History of Present Illness  07/16/2023  Pulmonary/ 1st office eval/Adric Wrede  Chief Complaint  Patient presents with   Establish Care    Abn pulm imaging  Baseline ex slower pace last several years but could  shop for at least 30 min at s stopping due to doe  PTA Dyspnea:  physical therapy twice a week plus  ex her own but not checking sats while ex   Cough: resolved  Sleep: 10-15 degrees HOB new setting since covid  SABA use: none Rec My office will be contacting you by phone for referral for Overnight 02 on Room air  > done 08/28/23 (see below)      08/27/2023  f/u ov/[place] office/Samarth Ogle re: post covid maint on RA   / no resp  rx Chief Complaint  Patient presents with   Shortness of Breath  Dyspnea:  whole store including [employer] / sams s problem pushing cart and sat 94% s limited doe / legs get tired @ wt same as baseline  Cough: occ noct cough with your usual wheeze  Sleeping: 10 degrees s    resp cc  SABA use: none  02: none Rec Try lopressor 100 mg one half in am and pm  Add pepcid  20 mg taken after supper (over the counter) GERD diet reviewed, bed blocks rec    To get the most out of exercise, you need to be continuously aware that you are short of breath, but never out of breath, for at least 30 minutes daily.   Make sure you check your oxygen saturations at highest level of activity   If not improved to your satisfaction return after the holidays for re-evaluation    ONO on Aug 28 2023  desat x 2 h 8 min @ < 89%  >  09/08/2023   rec 2lpm and repeat on 2lpm performed  on 16th Dec [facility] > not available as of 10/21/2023    10/21/2023  f/u ov/[place] office/Kendrell Lottman re: noct hypoxemia  maint on 2lpm  post covid  Chief Complaint  Patient presents with   Follow-up  Dyspnea:  target and sams ok consistently low 90s RA Cough: sometimes choking sensation x years assoc hoarseness esp in am s obvious sinus cc  Sleeping: electric bed around 30 degrees helps a lot  SABA use: no  02: just 2lpm sleeping only    No obvious day to day or daytime variability or assoc excess/ purulent sputum or mucus plugs or hemoptysis or cp or chest tightness, subjective wheeze or overt sinus or hb symptoms.    Also denies any obvious fluctuation of symptoms with weather or environmental changes or other aggravating or alleviating factors except as outlined above   No unusual exposure hx or h/o childhood pna/ asthma or knowledge of premature birth.  Current Allergies, Complete Past Medical History, Past Surgical History, Family History, and Social History were reviewed in [employer] record.  ROS  The  following are not active complaints unless bolded Hoarseness, sore throat, dysphagia, dental problems, itching, sneezing,  nasal congestion or discharge of excess mucus or purulent secretions, ear ache,   fever, chills, sweats, unintended wt loss or wt gain, classically pleuritic or exertional cp,  orthopnea pnd or arm/hand swelling  or leg swelling, presyncope, palpitations, abdominal pain, anorexia, nausea, vomiting, diarrhea  or change in bowel habits or change in bladder habits, change in stools or change in urine, dysuria, hematuria,  rash, arthralgias, visual complaints, headache, numbness, weakness or ataxia or problems with walking or coordination,  change in mood or  memory.        Current Meds  Medication Sig   aspirin  EC 81 MG tablet Take 81 mg by mouth at bedtime.    Coenzyme Q10 (COQ-10) 100 MG CAPS Take 1 capsule by mouth daily.   docusate sodium (COLACE) 100 MG capsule  Take 200 mg by mouth at bedtime.   famotidine  (PEPCID ) 20 MG tablet One after supper   FLUoxetine (PROZAC) 20 MG capsule Take 20 mg by mouth every morning.    furosemide (LASIX) 40 MG tablet Take 40 mg by mouth daily.   gabapentin  (NEURONTIN ) 100 MG capsule Take 100 mg by mouth 2 (two) times daily.   levothyroxine  (SYNTHROID ) 88 MCG tablet Take 1 tablet (88 mcg total) by mouth daily.   loratadine (CLARITIN) 10 MG tablet Take 10 mg by mouth daily.   losartan  (COZAAR ) 100 MG tablet Take 1 tablet (100 mg total) by mouth every evening.   meclizine (ANTIVERT) 25 MG tablet Take 12.5 mg by mouth at bedtime as needed.   metoprolol succinate (TOPROL-XL) 100 MG 24 hr tablet Take 100 mg by mouth every morning. Take with or immediately following a meal.   Multiple Vitamin (MULTIVITAMIN) tablet Take 1 tablet by mouth daily.   omeprazole (PRILOSEC) 20 MG capsule Take 20 mg by mouth daily before breakfast.   OZEMPIC, 0.25 OR 0.5 MG/DOSE, 2 MG/1.5ML SOPN Inject 1 mL into the skin once a week.   pantoprazole  (PROTONIX ) 40 MG tablet  Take 1 tablet (40 mg total) by mouth daily. Take 30-60 min before first meal of the day   potassium chloride (KLOR-CON) 10 MEQ tablet Take 10 mEq by mouth daily.   simvastatin (ZOCOR) 80 MG tablet Take 1 tablet by mouth at bedtime.   triamcinolone  cream (KENALOG ) 0.5 % Apply 1 application topically 3 (three) times daily as needed. For rash/irritation.   vitamin C (ASCORBIC ACID) 500 MG tablet Take 500 mg by mouth daily.   Vitamin D, Cholecalciferol, 1000 UNITS CAPS Take 1,000 mg by mouth 2 (two) times daily.            Past Medical History:  Diagnosis Date   Anxiety    Depression    DJD (degenerative joint disease)    Esophageal reflux    Essential hypertension    Fibrocystic breast disease    Hypothyroidism    Left bundle branch block    Transient - possibly rate related    Menopause    Osteoarthritis    PONV (postoperative nausea and vomiting)    pt. would like to have a scopolamine  patch   Restless leg syndrome    Scoliosis    Type 2 diabetes mellitus (HCC)       Objective:    Wts   10/21/2023     243   08/27/23 237 lb (107.5 kg)  07/25/23 237 lb 12.8 oz (107.9 kg)  07/16/23 236 lb (107 kg)    Vital signs reviewed  10/21/2023  - Note at rest 02 sats  94% on RA   General appearance:    amb MO (by bmi) wf nad     HEENT : Oropharynx  clear      Nasal turbinates nl    NECK :  without  apparent JVD/ palpable Nodes/TM    LUNGS: no acc muscle use,  Nl contour chest which is clear to A and P bilaterally without cough on insp or exp maneuvers   CV:  RRR  no s3 or murmur or increase in P2, and no edema   ABD:  quite obese soft and nontender   MS:  Gait nl   ext warm without deformities Or obvious joint restrictions  calf tenderness, cyanosis or clubbing    SKIN: warm and dry without lesions    NEURO:  alert, approp,  nl sensorium with  no motor or cerebellar deficits apparent.       Assessment

## 2023-10-21 NOTE — Telephone Encounter (Signed)
Called and lvm with rebecca at [facility] regard the results of ono to be faxed to the [place] office .

## 2023-10-21 NOTE — Telephone Encounter (Signed)
Pt was seen in the office  10/21/23 , regarding this message

## 2023-10-22 NOTE — Assessment & Plan Note (Addendum)
 Onset x  "maybe a decade" prior to mentioning it in clinic 10/21/2023    - occ choking sensation and hoarseness in MO setting most c/w LPR/GERD - rx max gerd rx 10/21/2023 and f/u ent prn

## 2023-10-22 NOTE — Assessment & Plan Note (Signed)
 ONO on Aug 28 2023  desat x 2 h 8 min @ < 89%  >  09/08/2023   rec 2lpm and repeat on 2lpm - s/p sleep eval by KClance 2015 s OSA > referred back to sleep medicine and in [place] should continue 02 at 2lpm

## 2023-10-22 NOTE — Assessment & Plan Note (Signed)
 Onset with covid 19 pna  06/06/2023 > walking sats ok at d/c 06/12/23  - echo 06/09/23  1. The left ventricle is normal in size with mildly increased wall  thickness.   2. The left ventricular systolic function is normal, LVEF is visually  estimated at > 55%.    3. Mitral annular calcification is present.    4. The aortic valve is trileaflet with mildly thickened leaflets with normal  excursion.   5. The left atrium is mildly dilated in size.    6. The right ventricle is normal in size, with normal systolic function.  - 07/16/2023   Walked on RA  x  1  lap(s) =  approx 250  ft  @ slow/ slt unsteady pace, stopped due to hip pain/ balance issues s sob with lowest 02 sats 94%  - 08/27/2023   Walked on RA   x  one   lap(s) =  approx 150  ft  @ slow  pace, stopped due to fatigue/unsteadiness/back pain and  with lowest 02 sats 94%   -  10/21/2023   Walked on RA  x  3  lap(s) =  approx 450  ft  @ mod pace, stopped due to end of study  with lowest 02 sats 93%  with onset mild sob on 2nd lap    Clearly improved from prior eval on no pulmlonary rx with only issue being noct hypoxemia likely due to obesity but no form of airways dz or progressive pf related to post covid status.   Pulmonary f/u can be prn

## 2023-10-22 NOTE — Assessment & Plan Note (Signed)
 Body mass index is 41.76 kg/m.  -  trending up Lab Results  Component Value Date   TSH 0.144 (L) 07/30/2023      Contributing to doe and risk of GERD/dvt / PE  >>>   reviewed the need and the process to achieve and maintain neg calorie balance > defer f/u primary care including intermittently monitoring thyroid  status      Each maintenance medication was reviewed in detail including emphasizing most importantly the difference between maintenance and prns and under what circumstances the prns are to be triggered using an action plan format where appropriate.  Total time for H and P, chart review, counseling, reviewing hfa device(s) , directly observing portions of ambulatory 02 saturation study/ and generating customized AVS unique to this office visit / same day charting = 34 min final summary f/u ov

## 2023-10-27 ENCOUNTER — Ambulatory Visit: Payer: Medicare Other | Admitting: Cardiology

## 2023-10-27 ENCOUNTER — Ambulatory Visit: Payer: Medicare Other | Attending: Cardiology | Admitting: Cardiology

## 2023-10-27 ENCOUNTER — Encounter: Payer: Self-pay | Admitting: Cardiology

## 2023-10-27 VITALS — BP 120/74 | HR 71 | Ht 64.0 in | Wt 240.6 lb

## 2023-10-27 DIAGNOSIS — Z8679 Personal history of other diseases of the circulatory system: Secondary | ICD-10-CM

## 2023-10-27 DIAGNOSIS — I10 Essential (primary) hypertension: Secondary | ICD-10-CM

## 2023-10-27 DIAGNOSIS — E782 Mixed hyperlipidemia: Secondary | ICD-10-CM

## 2023-10-27 DIAGNOSIS — I2510 Atherosclerotic heart disease of native coronary artery without angina pectoris: Secondary | ICD-10-CM | POA: Diagnosis not present

## 2023-10-27 NOTE — Patient Instructions (Addendum)

## 2023-10-27 NOTE — Progress Notes (Signed)
    Cardiology Office Note  Date: 10/27/2023   ID: SHATORA WEATHERBEE, DOB 10-17-1946, MRN [id number]  History of Present Illness: Grace Delacruz is a 78 y.o. female last seen in September 2024.  She is here with her husband for a follow-up visit.  Reports no increasing dyspnea exertion or progressive fluid retention on current regimen.  No exertional chest pain or palpitations.  I reviewed her medications.  Current cardiovascular regimen includes aspirin , Lasix, Cozaar , Toprol-XL, potassium supplement, Zocor, and Ozempic.  She continues to follow with Dr. Trudy.  Typically takes Lasix once daily, only occasionally has to double the dose.  Blood pressure is well-controlled today.  I reviewed her interval lab work.  Physical Exam: VS:  BP 120/74   Pulse 71   Ht 5' 4 (1.626 m)   Wt 240 lb 9.6 oz (109.1 kg)   SpO2 94%   BMI 41.30 kg/m , BMI Body mass index is 41.3 kg/m.  Wt Readings from Last 3 Encounters:  10/27/23 240 lb 9.6 oz (109.1 kg)  10/21/23 243 lb 4.8 oz (110.4 kg)  08/27/23 237 lb (107.5 kg)    General: Patient appears comfortable at rest. HEENT: Conjunctiva and lids normal. Neck: Supple, no elevated JVP or carotid bruits. Lungs: Clear to auscultation, nonlabored breathing at rest. Cardiac: Regular rate and rhythm, no S3 or significant systolic murmur. Extremities: Mild ankle edema.  ECG:  An ECG dated 06/09/2023 was personally reviewed today and demonstrated:  Sinus tachycardia with left bundle branch block.  Labwork: May 2023: Cholesterol 195, triglycerides 262, HDL 43, LDL 107 August 2024: Potassium 3.9, BUN 30, creatinine 0.99 07/30/2023: TSH 0.144  October 2024: Hemoglobin 13.6, platelets 236, hemoglobin A1c 5.8%  Other Studies Reviewed Today:  No interval cardiac testing for review today.  Assessment and Plan:  1.  Mild nonobstructive CAD by cardiac catheterization in 2019.  LVEF greater than 55% by follow-up echocardiogram at [facility] in August 2024.   No active angina.  Plan to continue medical therapy and observation.  She is on aspirin  and Zocor.  Also on Ozempic.   2.  Frequent PVCs and NSVT in the setting of normal LVEF.  She is symptomatically stable and continues on Toprol-XL 100 mg daily.   3.  Intermittent, likely rate related left bundle branch block.   4.  Primary hypertension.  Blood pressure well-controlled today.  Continue Cozaar  along with Toprol-XL.  Disposition:  Follow up  6 months.  Signed, Jayson JUDITHANN Sierras, M.D., F.A.C.C. [facility]

## 2023-10-30 NOTE — Telephone Encounter (Signed)
 Let her know I reviewed her ONO on 2lpm and it shows no desats so no change rx.

## 2023-11-03 ENCOUNTER — Telehealth: Payer: Self-pay

## 2023-11-03 NOTE — Telephone Encounter (Signed)
 Called patient to give ONO results per Dr. Darlean.  Patient asking about sleep study referral from her OV with Dr. Darlean on 10/21/2023.  Did not see order in.  Will place order and let [facility] contact patient.  Per Dr. Darlean chart note: My office will be contacting you by phone for referral to sleep medicine evaluation  in [facility]   - if you don't hear back from my office within one week please call us  back or notify us  thru MyChart and we'll address it right away.   Dr Darlean:  What type of sleep study do you want to order, home study or in lab study?  Please advise.  Thank you.

## 2023-11-03 NOTE — Telephone Encounter (Signed)
 In the meantime she is doing fine on the 2lpm and her last sleep study was neg per Clance so no need to expedite repeat study

## 2023-11-03 NOTE — Telephone Encounter (Signed)
Called patient.  Gave information. Patient verbalized understanding.

## 2023-11-03 NOTE — Telephone Encounter (Signed)
 Let the sleep doctor decide at time of eval what type of study needed

## 2023-11-06 DIAGNOSIS — E119 Type 2 diabetes mellitus without complications: Secondary | ICD-10-CM | POA: Diagnosis not present

## 2023-11-06 DIAGNOSIS — E039 Hypothyroidism, unspecified: Secondary | ICD-10-CM | POA: Diagnosis not present

## 2023-11-06 DIAGNOSIS — E876 Hypokalemia: Secondary | ICD-10-CM | POA: Diagnosis not present

## 2023-11-06 DIAGNOSIS — I10 Essential (primary) hypertension: Secondary | ICD-10-CM | POA: Diagnosis not present

## 2023-11-06 NOTE — Telephone Encounter (Signed)
Left message on home and mobile numbers for patient to call clinic to schedule a sleep evaluation in [facility] clinic with one of our sleep doctors per Dr. Sherene Sires.

## 2023-11-11 ENCOUNTER — Encounter: Payer: Self-pay | Admitting: Internal Medicine

## 2023-11-12 NOTE — Progress Notes (Unsigned)
11/13/23- [name] for sleep evaluation with hx Nocturnal Hypoxemia. Saw Dr Sherene Sires after Covid pneumonia, Respiratory Failure August, 2024. Did not desat on walk test. NPSG 11/05/13- AHI/ 2.4/hr, desat to 85%transiently, body weight 253 lbs ONO on Aug 28 2023  desat x 2 h 8 min @ < 89%  >  09/08/2023   rec 2lpm and repeat on 2lpm > done 10/06/23 and no desats so no change rx  - s/p sleep eval by [facility] 2015 s OSA > referred back to sleep medicine 10/21/23 >>>   Medical problem list includes HTN, DM2, Hypothyroid, Polyneuropathy, Morbid Obesity, Chronic Cough,   CXR 08/27/23 IMPRESSION: *No active cardiopulmonary disease

## 2023-11-13 ENCOUNTER — Encounter: Payer: Self-pay | Admitting: Internal Medicine

## 2023-11-13 ENCOUNTER — Ambulatory Visit: Payer: Medicare Other | Admitting: Internal Medicine

## 2023-11-13 VITALS — BP 136/80 | HR 75 | Temp 97.5°F | Ht 64.0 in | Wt 241.6 lb

## 2023-11-13 DIAGNOSIS — G4734 Idiopathic sleep related nonobstructive alveolar hypoventilation: Secondary | ICD-10-CM | POA: Diagnosis not present

## 2023-11-13 NOTE — Progress Notes (Signed)
Had 2 Covid vaccines and a booster.  Does not plan to get any further.

## 2023-11-13 NOTE — Patient Instructions (Signed)
Order- schedule home sleep test   dx OSA  Order - schedule PFT    dx dyspnea on exertion  Please call me about 2 weeks after your sleep test for results and recommendation

## 2023-11-17 ENCOUNTER — Telehealth: Payer: Self-pay | Admitting: Nurse Practitioner

## 2023-11-17 NOTE — Telephone Encounter (Signed)
Pts notes for [place] state labs and U/S, pt states she had an ultrasound done already, but does she need another one before this visit?

## 2023-11-17 NOTE — Telephone Encounter (Signed)
No, she will not need another ultrasound before the visit in April, just labs.  I have already sent her a message going over the ultrasound so she is all good.

## 2023-11-17 NOTE — Telephone Encounter (Signed)
Called and relayed information to pt.

## 2023-11-24 ENCOUNTER — Telehealth: Payer: Self-pay | Admitting: Internal Medicine

## 2023-11-24 DIAGNOSIS — G4733 Obstructive sleep apnea (adult) (pediatric): Secondary | ICD-10-CM

## 2023-11-24 NOTE — Telephone Encounter (Signed)
Pt called to say no HST ordered. PCC's said no order was put in. Please call PT to advise. TY. [phone]

## 2023-11-25 DIAGNOSIS — K219 Gastro-esophageal reflux disease without esophagitis: Secondary | ICD-10-CM | POA: Diagnosis not present

## 2023-11-25 DIAGNOSIS — E785 Hyperlipidemia, unspecified: Secondary | ICD-10-CM | POA: Diagnosis not present

## 2023-11-25 DIAGNOSIS — R300 Dysuria: Secondary | ICD-10-CM | POA: Diagnosis not present

## 2023-11-25 DIAGNOSIS — E039 Hypothyroidism, unspecified: Secondary | ICD-10-CM | POA: Diagnosis not present

## 2023-11-25 DIAGNOSIS — E1165 Type 2 diabetes mellitus with hyperglycemia: Secondary | ICD-10-CM | POA: Diagnosis not present

## 2023-11-25 NOTE — Telephone Encounter (Signed)
 I called and spoke with th pt. Pt states she saw Dr Neysa on 11-13-23, and the HST was never ordered. I checked the lov, and it was stated to have this ordered. I informed pt that I would have this ordered for her, and have Dr Neysa sign it and we will have this sent urgently.  Pt verbalized understanding. NFN. Order sent to [facility].

## 2023-11-28 ENCOUNTER — Other Ambulatory Visit (HOSPITAL_BASED_OUTPATIENT_CLINIC_OR_DEPARTMENT_OTHER): Payer: Self-pay

## 2023-11-28 DIAGNOSIS — R0609 Other forms of dyspnea: Secondary | ICD-10-CM

## 2023-12-02 ENCOUNTER — Ambulatory Visit (HOSPITAL_BASED_OUTPATIENT_CLINIC_OR_DEPARTMENT_OTHER): Payer: Medicare Other | Admitting: Internal Medicine

## 2023-12-02 ENCOUNTER — Ambulatory Visit: Payer: Medicare Other | Admitting: Nurse Practitioner

## 2023-12-02 DIAGNOSIS — R0609 Other forms of dyspnea: Secondary | ICD-10-CM | POA: Diagnosis not present

## 2023-12-02 LAB — PULMONARY FUNCTION TEST
DL/VA % pred: 121 %
DL/VA: 4.94 ml/min/mmHg/L
DLCO cor % pred: 93 %
DLCO cor: 18.45 ml/min/mmHg
DLCO unc % pred: 93 %
DLCO unc: 18.45 ml/min/mmHg
FEF 25-75 Post: 2.82 L/s
FEF 25-75 Pre: 2.28 L/s
FEF2575-%Change-Post: 23 %
FEF2575-%Pred-Post: 168 %
FEF2575-%Pred-Pre: 136 %
FEV1-%Change-Post: 4 %
FEV1-%Pred-Post: 89 %
FEV1-%Pred-Pre: 85 %
FEV1-Post: 1.94 L
FEV1-Pre: 1.85 L
FEV1FVC-%Change-Post: 5 %
FEV1FVC-%Pred-Pre: 112 %
FEV6-%Change-Post: 0 %
FEV6-%Pred-Post: 79 %
FEV6-%Pred-Pre: 80 %
FEV6-Post: 2.2 L
FEV6-Pre: 2.21 L
FEV6FVC-%Pred-Post: 105 %
FEV6FVC-%Pred-Pre: 105 %
FVC-%Change-Post: 0 %
FVC-%Pred-Post: 75 %
FVC-%Pred-Pre: 76 %
FVC-Post: 2.2 L
FVC-Pre: 2.21 L
Post FEV1/FVC ratio: 88 %
Post FEV6/FVC ratio: 100 %
Pre FEV1/FVC ratio: 84 %
Pre FEV6/FVC Ratio: 100 %
RV % pred: 85 %
RV: 2.01 L
TLC % pred: 87 %
TLC: 4.56 L

## 2023-12-02 NOTE — Patient Instructions (Signed)
Full PFT Performed Today

## 2023-12-02 NOTE — Progress Notes (Signed)
Full PFT Performed Today

## 2023-12-07 ENCOUNTER — Encounter

## 2023-12-07 DIAGNOSIS — G4730 Sleep apnea, unspecified: Secondary | ICD-10-CM | POA: Diagnosis not present

## 2023-12-07 DIAGNOSIS — G4733 Obstructive sleep apnea (adult) (pediatric): Secondary | ICD-10-CM

## 2023-12-08 ENCOUNTER — Encounter (INDEPENDENT_AMBULATORY_CARE_PROVIDER_SITE_OTHER): Payer: Self-pay | Admitting: *Deleted

## 2023-12-19 DIAGNOSIS — K219 Gastro-esophageal reflux disease without esophagitis: Secondary | ICD-10-CM | POA: Diagnosis not present

## 2023-12-19 DIAGNOSIS — G4733 Obstructive sleep apnea (adult) (pediatric): Secondary | ICD-10-CM | POA: Diagnosis not present

## 2023-12-19 DIAGNOSIS — E1165 Type 2 diabetes mellitus with hyperglycemia: Secondary | ICD-10-CM | POA: Diagnosis not present

## 2023-12-19 DIAGNOSIS — E039 Hypothyroidism, unspecified: Secondary | ICD-10-CM | POA: Diagnosis not present

## 2023-12-24 ENCOUNTER — Ambulatory Visit: Payer: Medicare Other | Admitting: Neurology

## 2023-12-24 ENCOUNTER — Encounter: Payer: Self-pay | Admitting: Neurology

## 2023-12-24 VITALS — BP 146/77 | HR 72 | Ht 65.0 in | Wt 240.5 lb

## 2023-12-24 DIAGNOSIS — R208 Other disturbances of skin sensation: Secondary | ICD-10-CM | POA: Diagnosis not present

## 2023-12-24 DIAGNOSIS — H81399 Other peripheral vertigo, unspecified ear: Secondary | ICD-10-CM | POA: Diagnosis not present

## 2023-12-24 DIAGNOSIS — G629 Polyneuropathy, unspecified: Secondary | ICD-10-CM | POA: Diagnosis not present

## 2023-12-24 DIAGNOSIS — M2550 Pain in unspecified joint: Secondary | ICD-10-CM | POA: Diagnosis not present

## 2023-12-24 DIAGNOSIS — G4486 Cervicogenic headache: Secondary | ICD-10-CM

## 2023-12-24 DIAGNOSIS — R413 Other amnesia: Secondary | ICD-10-CM | POA: Diagnosis not present

## 2023-12-24 DIAGNOSIS — U099 Post covid-19 condition, unspecified: Secondary | ICD-10-CM

## 2023-12-24 NOTE — Progress Notes (Signed)
 [facility]  PATIENT: Grace Delacruz DOB: 1946/07/18  REFERRING DOCTOR OR PCP: Mitzi Hansen, [name] SOURCE: Patient, notes from primary care, imaging and laboratory reports, MRI images personally reviewed.  _________________________________   HISTORICAL  CHIEF COMPLAINT:  Chief Complaint  Patient presents with   Follow-up    Rm10, husband present, Polyneuropathy: bilateral feet and hands still tingly and numb, peripheral vertigo:had some dizzy spells, (orthostatic bp completed) Cervicogenic headache:4/30 days, o2 at night 2l    HISTORY OF PRESENT ILLNESS:  Grace Delacruz is a 78 y.o. woman with  vertigo, headaches and gait disturbance and foot numbness  UPDATE 12/24/2023 In August 2024 she had Covid-19 and pneumonia and was hospitalized 8 days or so.  Since then she notes a brain fog.  She is having numbness in her fingertips and feet.   More proximal arms and legs are not involved. This is tingling > numbness.  Sometimes, the numbness is painful in her fingers and toes.    She has NIDDM (since 2007).  She notes the numbness more when she is resting.  At times it can become very painful and other times it seems milder   She is having some dizziness - lightheaded more than vertigo but balance seems off as she stands up.  In the past, she saw ENT and was told hearing was fine.    She had VNG at [facility] c/w benign paroxysmal positional vertigo.    Epley maneuvers have helped.     Occipital headaches are not too bad.  We had done a TPI in 2022.    The MRI of the brain 8/03/202 showed atrophy and chronic microvascular ischemic changes.  MRI of the cervical spine showed a normal spinal cord.  No spinal stenosis.  She has mild DJD.  Nothing was seen on either scan to explain her gait issues.    She has NIDM and HTN.     She has urinary frequency and nocturia   Vascular risks:  She has well controlled essential hypertension.   Never smoked.   She has Type 2 NIDDM  > 10 years.    Images personally reviewed.   MRI brain 06/03/2019 showed age-appropriate mild generalized cortical atrophy, scattered T2/FLAIR hyperintense foci consistent with mild chronic microvascular ischemic change and an arachnoid cyst in the left middle fossa.  There were no acute findings.  Vertebral arteries are diminutive and the left terminates as the left posterior inferior cerebellar artery.  Posterior communicating arteries are present.  MRI [facility] 05/23/2021 showed  The internal auditory canals appear normal.     Scattered T2/FLAIR hyperintense foci in the hemispheres consistent with mild to moderate chronic microvascular ischemic change, unchanged compared to the previous MRI.   Mild generalized cortical atrophy, typical for age..   No acute findings.  Normal enhancement pattern.  MRI cervical spine 05/23/2021 showed    Mild degenerative changes at C3-C4 through C6-C7 that do not lead to nerve root compression or spinal stenosis.   The spinal cord is normal signal..   Asymmetric thyroid gland.  There appears to be surgical resection of the right hemithyroid and there is heterogenous signal in the left hemithyroid.    LABS: 05/10/2021:    B12, SPEP/IEF, ssa/ssb, RF were normal   REVIEW OF SYSTEMS: Constitutional: No fevers, chills, sweats, or change in appetite Eyes: No visual changes, double vision, eye pain.  She has dry eyes. Ear, nose and throat: No hearing loss, ear pain, nasal congestion, sore throat.  She had vertigo. Cardiovascular: No chest pain, palpitations Respiratory:  No shortness of breath at rest or with exertion.   No wheezes GastrointestinaI: No nausea, vomiting, diarrhea, abdominal pain, fecal incontinence Genitourinary:  No dysuria, urinary retention or frequency.  2 x  nocturia Musculoskeletal:  No neck pain, back pain Integumentary: No rash, pruritus, skin lesions Neurological: as above Psychiatric: No depression at this time.  No anxiety Endocrine: Well  controlled type 2 NIDDM Hematologic/Lymphatic:  No anemia, purpura, petechiae. Allergic/Immunologic: No itchy/runny eyes, nasal congestion, recent allergic reactions, rashes  ALLERGIES: Allergies  Allergen Reactions   Ephedrine Palpitations    Heart rate fast-   Hibiclens [Chlorhexidine Gluconate] Rash    SKIN REDNESS AND BURNING SENSATION    NO CHG   Keflex [Cephalexin] Itching and Rash    HOME MEDICATIONS:  Current Outpatient Medications:    aspirin EC 81 MG tablet, Take 81 mg by mouth at bedtime. , Disp: , Rfl:    Coenzyme Q10 (COQ-10) 100 MG CAPS, Take 1 capsule by mouth daily., Disp: , Rfl:    docusate sodium (COLACE) 100 MG capsule, Take 200 mg by mouth at bedtime., Disp: , Rfl:    famotidine (PEPCID) 20 MG tablet, One after supper, Disp: 30 tablet, Rfl: 11   FLUoxetine (PROZAC) 20 MG capsule, Take 20 mg by mouth every morning. , Disp: , Rfl:    furosemide (LASIX) 40 MG tablet, Take 40 mg by mouth daily., Disp: , Rfl:    gabapentin (NEURONTIN) 100 MG capsule, Take 100 mg by mouth 2 (two) times daily., Disp: , Rfl:    levothyroxine (SYNTHROID) 88 MCG tablet, Take 1 tablet (88 mcg total) by mouth daily., Disp: 90 tablet, Rfl: 3   loratadine (CLARITIN) 10 MG tablet, Take 10 mg by mouth daily., Disp: , Rfl:    losartan (COZAAR) 100 MG tablet, Take 1 tablet (100 mg total) by mouth every evening., Disp: , Rfl:    meclizine (ANTIVERT) 25 MG tablet, Take 12.5 mg by mouth at bedtime as needed., Disp: , Rfl:    metoprolol succinate (TOPROL-XL) 100 MG 24 hr tablet, Take 100 mg by mouth every morning. Take with or immediately following a meal., Disp: , Rfl:    Multiple Vitamin (MULTIVITAMIN) tablet, Take 1 tablet by mouth daily., Disp: , Rfl:    OXYGEN, Inhale 2 L into the lungs at bedtime., Disp: , Rfl:    OZEMPIC, 0.25 OR 0.5 MG/DOSE, 2 MG/1.5ML SOPN, Inject 1 mL into the skin once a week., Disp: , Rfl:    pantoprazole (PROTONIX) 40 MG tablet, Take 1 tablet (40 mg total) by mouth daily.  Take 30-60 min before first meal of the day, Disp: 30 tablet, Rfl: 2   potassium chloride (KLOR-CON) 10 MEQ tablet, Take 10 mEq by mouth daily., Disp: , Rfl:    simvastatin (ZOCOR) 80 MG tablet, Take 1 tablet by mouth at bedtime., Disp: , Rfl:    triamcinolone cream (KENALOG) 0.5 %, Apply 1 application topically 3 (three) times daily as needed. For rash/irritation., Disp: , Rfl:    vitamin C (ASCORBIC ACID) 500 MG tablet, Take 500 mg by mouth daily., Disp: , Rfl:    Vitamin D, Cholecalciferol, 1000 UNITS CAPS, Take 1,000 mg by mouth 2 (two) times daily., Disp: , Rfl:   PAST MEDICAL HISTORY: Past Medical History:  Diagnosis Date   Anxiety    Depression    DJD (degenerative joint disease)    Esophageal reflux    Essential hypertension  Fibrocystic breast disease    Hypothyroidism    Left bundle branch block    Transient - possibly rate related    Menopause    Osteoarthritis    PONV (postoperative nausea and vomiting)    pt. would like to have a scopolamine patch   Restless leg syndrome    Scoliosis    Type 2 diabetes mellitus (HCC)     PAST SURGICAL HISTORY: Past Surgical History:  Procedure Laterality Date   BREAST BIOPSY     lt x3   CARPOMETACARPEL SUSPENSION PLASTY Left 03/08/2014   Procedure: LEFT THUMB CARPOMETACARPEL (CMC) SUSPENSION PLASTY WITH TRAPEZIUM EXCISION;  Surgeon: Wyn Forster., MD;  Location: [facility];  Service: Orthopedics;  Laterality: Left;   CATARACT EXTRACTION Bilateral    CESAREAN SECTION     CHOLECYSTECTOMY     COLONOSCOPY  03/26/2007   RMR:  Diffuse changes of proctocolitis as described above diminutive rectal polyp, cold biopsy/removed, pedunculated polyp of the splenic flexure hot snare removed.  Segmental biopsies the colon taken of the ileocecal valve ulcer biopsied normal terminal ileum   COLONOSCOPY N/A 01/05/2014   Procedure: COLONOSCOPY;  Surgeon: Malissa Hippo, MD;  [facility];  Service: Endoscopy;   Laterality: N/A;  830   EYE SURGERY     GASTROCNEMIUS RECESSION Right 08/07/2016   Procedure: GASTROCNEMIUS RECESSION;  Surgeon: Nadara Mustard, MD;  Location: [facility];  Service: Orthopedics;  Laterality: Right;   Joint fusion-foot     x2 LEFT FOOT   KNEE ARTHROPLASTY  2001   Right   KNEE ARTHROPLASTY  2002   Left   LEFT HEART CATH AND CORONARY ANGIOGRAPHY N/A 06/18/2018   Procedure: LEFT HEART CATH AND CORONARY ANGIOGRAPHY;  Surgeon: Kathleene Hazel, MD;  Location: [facility];  Service: Cardiovascular;  Laterality: N/A;   Left leg vein resection     RETINAL DETACHMENT SURGERY Left 02/25/2022   done in [facility]   ROTATOR CUFF REPAIR     LEFT   THYROIDECTOMY  1980   3/4   TONSILLECTOMY     TUBAL LIGATION     WEIL OSTEOTOMY Right 08/07/2016   Procedure: WEIL OSTEOTOMY 2nd and 3rd Metatarsal Right Foot;  Surgeon: Nadara Mustard, MD;  Location: [facility];  Service: Orthopedics;  Laterality: Right;   WEIL OSTEOTOMY Left 12/25/2016   Procedure: 2nd Toe Proximal Interphalangeal Resection, Weil Osteotomy 2nd and 3rd Metatarsal Left Foot;  Surgeon: Nadara Mustard, MD;  Location: [facility];  Service: Orthopedics;  Laterality: Left;    FAMILY HISTORY: Family History  Problem Relation Age of Onset   Hypertension Mother    Heart failure Father        Died with pneumonia   Hypertension Father    Arthritis Father    Diabetes Father    COPD Brother    Asthma Paternal Grandfather    Emphysema Paternal Grandfather    Colon cancer Neg Hx    Esophageal cancer Neg Hx    Stomach cancer Neg Hx    Colon polyps Neg Hx     SOCIAL HISTORY:  Social History   Socioeconomic History   Marital status: Married    Spouse name: Not on file   Number of children: 1   Years of education: Not on file   Highest education level: Not on file  Occupational History   Occupation: retired  Tobacco Use   Smoking status: Never   Smokeless tobacco: Never  Vaping  Use   Vaping status: Never Used  Substance  and Sexual Activity   Alcohol use: Yes    Alcohol/week: 0.0 standard drinks of alcohol    Comment: rare-- glass of wine at christmas   Drug use: No   Sexual activity: Not on file  Other Topics Concern   Not on file  Social History Narrative   Not on file   Social Drivers of Health   Financial Resource Strain: Low Risk  (06/10/2023)   Received from [facility]   Overall Financial Resource Strain (CARDIA)    Difficulty of Paying Living Expenses: Not hard at all  Food Insecurity: No Food Insecurity (06/10/2023)   Received from [facility]   Hunger Vital Sign    Worried About Running Out of Food in the Last Year: Never true    Ran Out of Food in the Last Year: Never true  Transportation Needs: No Transportation Needs (06/10/2023)   Received from [facility] - Transportation    Lack of Transportation (Medical): No    Lack of Transportation (Non-Medical): No  Physical Activity: Not on file  Stress: Not on file  Social Connections: Not on file  Intimate Partner Violence: Not At Risk (06/10/2023)   Received from [facility]   Humiliation, Afraid, Rape, and Kick questionnaire    Fear of Current or Ex-Partner: No    Emotionally Abused: No    Physically Abused: No    Sexually Abused: No     PHYSICAL EXAM  Vitals:   12/24/23 1017 12/24/23 1018 12/24/23 1020  BP: (!) 171/80 (!) 155/80 (!) 146/77  Pulse: 69 73 72  Weight: 240 lb 8 oz (109.1 kg)    Height: 5\' 5"  (1.651 m)      Body mass index is 40.02 kg/m.   General: The patient is well-developed and well-nourished and in no acute distress  HEENT:  Head is /AT.  Sclera are anicteric.     Neck: No carotid bruits are noted.  The neck is tender over the occiput left > right  Skin: Extremities are without rash or  edema.   She has varicose veins    Neurologic Exam  Mental status: The patient is alert and oriented x 3 at the time of the examination. The patient has apparent normal recent and  remote memory, with an apparently normal attention span and concentration ability.   Speech is normal.  Cranial nerves: Extraocular movements are full. Facial strength and sensation are normal.     Mild reduced right hearing and Weber lateralizes right.     Motor:  Muscle bulk is normal.   Tone is normal. Strength is  5 / 5 in all 4 extremities including toes.   Sensory: Sensory testing is intact to pinprick, soft touch and vibration sensation in arms/hands except mild reduced PP right ulnar distribution of hand.    She has reduced vibration at ankles (75%) and toes (0-10%)   Reduced touch/pp mild at ankles more at toes.   Coordination: Cerebellar testing reveals good finger-nose-finger and heel-to-shin bilaterally.  Gait and station: Station is normal.   Gait is normal for age. Tandem gait is wide .  The was no Romberg sign    Reflexes: Deep tendon reflexes are symmetric and normal in arms, absent in legs.        ASSESSMENT AND PLAN   Polyneuropathy - Plan: Sjogren's syndrome antibods(ssa + ssb), Multiple Myeloma Panel (SPEP&IFE w/QIG), Rheumatoid factor, Vitamin B12,  CANCELED: Vitamin B12  Other peripheral vertigo, unspecified ear  Cervicogenic headache  Long COVID  Memory loss - Plan: MR BRAIN WO CONTRAST  Dysesthesia - Plan: MR BRAIN WO CONTRAST, Sjogren's syndrome antibods(ssa + ssb), Multiple Myeloma Panel (SPEP&IFE w/QIG), Rheumatoid factor, Vitamin B12, CANCELED: Vitamin B12  Arthralgia, unspecified joint - Plan: Sjogren's syndrome antibods(ssa + ssb), Rheumatoid factor    Patient MRI of the brain due to worsening memory issues and numbness.  Will also check a vitamin B12.  She is on Synthroid for known thyroid issues She appears to have a small fiber greater than large fiber polyneuropathy.  Most likely this is due to her diabetes but we need to assess for autoimmune and metabolic issues that might also be causing this process.  If dysesthesias worsen, can increase  gabapentin to 100-100-200 mg over the day or higher.   COuld also consider changing fluoxetine to Cymbalta.   If numbness worsens more rapidly we will consider an NCV/EMG study. She will return to see [place] in 6 months or sooner if there are new or worsening neurologic symptoms.  40-minute office visit with the majority of the time spent face-to-face for history and physical, discussion/counseling and decision-making.  Additional time with record review and documentation.  This visit is part of a comprehensive longitudinal care medical relationship regarding the patients primary diagnosis of polyneuropathy and related concerns.   Grace Delacruz A. Epimenio Foot, MD, [facility] 12/24/2023, 10:53 AM Certified in Neurology, Clinical Neurophysiology, Sleep Medicine and Neuroimaging  [facility] [address] [place], [place] [address] [phone]

## 2023-12-29 ENCOUNTER — Encounter: Payer: Self-pay | Admitting: Neurology

## 2023-12-29 ENCOUNTER — Telehealth: Payer: Self-pay | Admitting: Neurology

## 2023-12-29 LAB — MULTIPLE MYELOMA PANEL, SERUM
Albumin SerPl Elph-Mcnc: 3.3 g/dL (ref 2.9–4.4)
Albumin/Glob SerPl: 1.1 (ref 0.7–1.7)
Alpha 1: 0.2 g/dL (ref 0.0–0.4)
Alpha2 Glob SerPl Elph-Mcnc: 0.8 g/dL (ref 0.4–1.0)
B-Globulin SerPl Elph-Mcnc: 1.1 g/dL (ref 0.7–1.3)
Gamma Glob SerPl Elph-Mcnc: 1 g/dL (ref 0.4–1.8)
Globulin, Total: 3.2 g/dL (ref 2.2–3.9)
IgA/Immunoglobulin A, Serum: 234 mg/dL (ref 64–422)
IgG (Immunoglobin G), Serum: 1008 mg/dL (ref 586–1602)
IgM (Immunoglobulin M), Srm: 65 mg/dL (ref 26–217)
Total Protein: 6.5 g/dL (ref 6.0–8.5)

## 2023-12-29 LAB — VITAMIN B12: Vitamin B-12: 725 pg/mL (ref 232–1245)

## 2023-12-29 LAB — RHEUMATOID FACTOR: Rheumatoid fact SerPl-aCnc: 12 [IU]/mL (ref <14.0)

## 2023-12-29 LAB — SJOGREN'S SYNDROME ANTIBODS(SSA + SSB)
ENA SSA (RO) Ab: 0.2 AI (ref 0.0–0.9)
ENA SSB (LA) Ab: <0.2 AI (ref 0.0–0.9)

## 2023-12-29 NOTE — Telephone Encounter (Signed)
 no auth required sent to GI [phone]

## 2024-01-08 DIAGNOSIS — Z1231 Encounter for screening mammogram for malignant neoplasm of breast: Secondary | ICD-10-CM | POA: Diagnosis not present

## 2024-01-10 NOTE — Progress Notes (Unsigned)
 11/13/23- [name] for sleep evaluation with hx Nocturnal Hypoxemia. Saw [name] after Covid pneumonia, Respiratory Failure August, 2024. Did not desat on walk test. NPSG 11/05/13- AHI/ 2.4/hr, desat to 85%transiently, body weight 253 lbs   Medical problem list includes HTN, DM2, Hypothyroid, Polyneuropathy, Morbid Obesity, Chronic Cough,  ONO on Aug 28 2023  desat x 2 h 8 min @ < 89%  >  09/08/2023   rec 2lpm and repeat on 2lpm performed  on 16th Dec [employer] - remained 90% and above all night. CXR 08/27/23 IMPRESSION: *No active cardiopulmonary disease ENT surgery- tonsils, septoplasty. 1 diet soft drink, no other caffeine, no sleep meds.  01/12/24- [name] for sleep evaluation with hx Nocturnal Hypoxemia. Saw [name] after Covid pneumonia, Respiratory Failure August, 2024. Did not desat on walk test. NPSG 11/05/13- AHI/ 2.4/hr, desat to 85%transiently, body weight 253 lbs   Medical problem list includes HTN, DM2, Hypothyroid, Polyneuropathy, Morbid Obesity, Chronic Cough,  ONO on Aug 28 2023  desat x 2 h 8 min @ < 89%  >  09/08/2023   rec 2lpm and repeat on 2lpm performed  on 16th Dec [employer] - remained 90% and above all night. HST 12/07/23- AHI 11.5/hr, desat to 84%, time </= 88% was 13 minutes on room air O2 ordered [employer] 09/04/23- 2L sleep and POC PFT 12/02/23- WNL Body weight today-    ROS-see HPI   + = positive Constitutional:    weight loss, night sweats, fevers, chills, fatigue, lassitude. HEENT:    headaches, difficulty swallowing, tooth/dental problems, sore throat,       sneezing, itching, ear ache, nasal congestion, post nasal drip, snoring CV:    chest pain, orthopnea, PND, swelling in lower extremities, anasarca,                                   dizziness, palpitations Resp:   shortness of breath with exertion or at rest.                productive cough,   non-productive cough, coughing up of blood.              change in color of mucus.  wheezing.   Skin:    rash or lesions. GI:   No-   heartburn, indigestion, abdominal pain, nausea, vomiting, diarrhea,                 change in bowel habits, loss of appetite GU: dysuria, change in color of urine, no urgency or frequency.   flank pain. MS:   joint pain, stiffness, decreased range of motion, back pain. Neuro-     nothing unusual Psych:  change in mood or affect.  depression or anxiety.   memory loss.  OBJ- Physical Exam General- Alert, Oriented, Affect-appropriate, Distress- none acute, Obese Skin- rash-none, lesions- none, excoriation- none Lymphadenopathy- none Head- atraumatic            Eyes- Gross vision intact, PERRLA, conjunctivae and secretions clear            Ears- Hearing, canals-normal            Nose- Clear, no-Septal dev, mucus, polyps, erosion, perforation             Throat- Mallampati III-IV , mucosa clear , drainage- none, tonsils- absent, +teeth Neck- flexible , trachea midline, no stridor , thyroid nl, carotid no bruit Chest -  symmetrical excursion , unlabored           Heart/CV- RRR , no murmur , no gallop  , no rub, nl s1 s2                           - JVD- none , edema- none, stasis changes- none, varices- none           Lung- +few crackles, wheeze+trace, cough- none , dullness-none, rub- none           Chest wall-  Abd-  Br/ Gen/ Rectal- Not done, not indicated Extrem- cyanosis- none, clubbing, none, atrophy- none, strength- nl Neuro- grossly intact to observation  Assessment and Plan

## 2024-01-12 ENCOUNTER — Encounter: Payer: Self-pay | Admitting: Internal Medicine

## 2024-01-12 ENCOUNTER — Ambulatory Visit: Admitting: Internal Medicine

## 2024-01-12 VITALS — BP 144/84 | HR 74 | Temp 97.7°F | Ht 64.0 in | Wt 244.0 lb

## 2024-01-12 DIAGNOSIS — G4734 Idiopathic sleep related nonobstructive alveolar hypoventilation: Secondary | ICD-10-CM | POA: Diagnosis not present

## 2024-01-12 NOTE — Patient Instructions (Signed)
 Order- Overnight oximetry on room air     dx nocturnal hypoxemia  Order DME [employer]- please service home O2 concentrator- making grinding noise and slow to start.  Try Googling "Arna Medici device for sleep"    called Smart Arna Medici or something like that  CPAP or possibly a fitted oral appliance would  be best opportunities to address both low oxygen levels at night and daytime tiredness

## 2024-01-19 DIAGNOSIS — E039 Hypothyroidism, unspecified: Secondary | ICD-10-CM | POA: Diagnosis not present

## 2024-01-19 DIAGNOSIS — K219 Gastro-esophageal reflux disease without esophagitis: Secondary | ICD-10-CM | POA: Diagnosis not present

## 2024-01-19 DIAGNOSIS — E1165 Type 2 diabetes mellitus with hyperglycemia: Secondary | ICD-10-CM | POA: Diagnosis not present

## 2024-01-20 ENCOUNTER — Ambulatory Visit: Payer: Medicare Other | Admitting: Cardiology

## 2024-01-21 DIAGNOSIS — S01512A Laceration without foreign body of oral cavity, initial encounter: Secondary | ICD-10-CM | POA: Diagnosis not present

## 2024-01-21 DIAGNOSIS — R300 Dysuria: Secondary | ICD-10-CM | POA: Diagnosis not present

## 2024-01-21 DIAGNOSIS — N390 Urinary tract infection, site not specified: Secondary | ICD-10-CM | POA: Diagnosis not present

## 2024-01-21 DIAGNOSIS — M79644 Pain in right finger(s): Secondary | ICD-10-CM | POA: Diagnosis not present

## 2024-01-26 DIAGNOSIS — E890 Postprocedural hypothyroidism: Secondary | ICD-10-CM | POA: Diagnosis not present

## 2024-01-27 LAB — T4, FREE: Free T4: 1.32 ng/dL (ref 0.82–1.77)

## 2024-01-27 LAB — TSH: TSH: 0.989 u[IU]/mL (ref 0.450–4.500)

## 2024-01-29 ENCOUNTER — Encounter: Payer: Self-pay | Admitting: Internal Medicine

## 2024-01-29 ENCOUNTER — Ambulatory Visit: Payer: Medicare Other | Admitting: Internal Medicine

## 2024-01-29 VITALS — BP 108/68 | HR 82 | Ht 64.0 in | Wt 242.0 lb

## 2024-01-29 DIAGNOSIS — K5909 Other constipation: Secondary | ICD-10-CM | POA: Diagnosis not present

## 2024-01-29 DIAGNOSIS — K219 Gastro-esophageal reflux disease without esophagitis: Secondary | ICD-10-CM | POA: Diagnosis not present

## 2024-01-29 DIAGNOSIS — Z1211 Encounter for screening for malignant neoplasm of colon: Secondary | ICD-10-CM | POA: Insufficient documentation

## 2024-01-29 DIAGNOSIS — K5900 Constipation, unspecified: Secondary | ICD-10-CM

## 2024-01-29 MED ORDER — NA SULFATE-K SULFATE-MG SULF 17.5-3.13-1.6 GM/177ML PO SOLN: 1.0000 | Freq: Once | ORAL | 0 refills | Status: AC

## 2024-01-29 MED ORDER — RABEPRAZOLE SODIUM 20 MG PO TBEC: 20.0000 mg | DELAYED_RELEASE_TABLET | Freq: Every day | ORAL | 3 refills | Status: AC

## 2024-01-29 NOTE — Patient Instructions (Addendum)
 Continue Famotidine.  We have sent the following medications to your pharmacy for you to pick up at your convenience: Aciphex.  You have been scheduled for a colonoscopy. Please follow written instructions given to you at your visit today.   If you use inhalers (even only as needed), please bring them with you on the day of your procedure.  DO NOT TAKE 7 DAYS PRIOR TO TEST- Trulicity (dulaglutide) Ozempic, Wegovy (semaglutide) Mounjaro (tirzepatide) Bydureon Bcise (exanatide extended release)  DO NOT TAKE 1 DAY PRIOR TO YOUR TEST Rybelsus (semaglutide) Adlyxin (lixisenatide) Victoza (liraglutide) Byetta (exanatide) ___________________________________________________________________________  _______________________________________________________  If your blood pressure at your visit was 140/90 or greater, please contact your primary care physician to follow up on this.  _______________________________________________________  If you are age 77 or older, your body mass index should be between 23-30. Your Body mass index is 41.54 kg/m. If this is out of the aforementioned range listed, please consider follow up with your Primary Care Provider.  If you are age 22 or younger, your body mass index should be between 19-25. Your Body mass index is 41.54 kg/m. If this is out of the aformentioned range listed, please consider follow up with your Primary Care Provider.   ________________________________________________________  The [facility] providers would like to encourage you to use [facility] to communicate with providers for non-urgent requests or questions.  Due to long hold times on the telephone, sending your provider a message by [facility] may be a faster and more efficient way to get a response.  Please allow 48 business hours for a response.  Please remember that this is for non-urgent requests.  _______________________________________________________

## 2024-01-29 NOTE — Progress Notes (Signed)
   Subjective:    Patient ID: Grace Delacruz, female    DOB: 09/05/1946, 78 y.o.   MRN: [id number]  HPI  [name] is a 78 year old female with a remote history of ulcerative colitis, diabetes, hypothyroidism, sleep apnea, heart failure with exacerbation in setting of COVID-19 and bacterial pneumonia, nocturnal oxygen use, and elevated liver enzymes subsequently resolved with sepsis who presents for follow-up.  She is here today with her husband and I last saw her in late August 2024.  She has a history of ulcerative colitis, which has been in long-standing remission without therapy. She is due for a colonoscopy as her last one in 2015 was normal. She is willing to undergo the procedure at the hospital due to her need for nocturnal oxygen.  She experiences chronic constipation with periods of hard stools followed by urgent loose stools. She takes Colace, usually two at bedtime, to maintain regular bowel movements. Occasionally, she experiences constipation lasting more than two days and sometimes uses Miralax to manage these symptoms.  She experiences some reflux symptoms, taking pantoprazole in the morning and famotidine in the evening. She occasionally has breakthrough heartburn and describes episodes of choking on air, which occur randomly. She does not have difficulty swallowing food and does not experience pain from reflux.  She was last seen by cardiology on October 27, 2023, and has mild nonobstructive coronary artery disease with an LVEF greater than 55% on a follow-up echocardiogram in August 2024. She does not have active angina and continues on medical therapy and observation. She is on metoprolol for frequent PVCs and NSVT.  Her liver function tests have returned to normal following a previous exacerbation of heart failure and sepsis  Review of Systems As per HPI, otherwise negative  Current Medications, Allergies, Past Medical History, Past Surgical History, Family History and  Social History were reviewed in [employer] record.     Objective:   Physical Exam BP 108/68   Pulse 82   Ht 5\' 4"  (1.626 m)   Wt 242 lb (109.8 kg)   SpO2 97%   BMI 41.54 kg/m  Gen: awake, alert, NAD HEENT: anicteric  Ext: no c/c/e Neuro: nonfocal  LABS CBC: WBC 6.1, Hb 13.6, PLT 236 (07/2023) CMP: Cr 1.06, BUN 34, Total bilirubin 0.4, AST 25, ALT 8, Alkaline phosphatase 57, Albumin 3.1 (05/2023)  DIAGNOSTIC Echocardiogram: LVEF >55% (05/2023)      Assessment & Plan:  Gastroesophageal Reflux Disease (GERD) Intermittent reflux symptoms persist despite current regimen. Consideration of medication switch for improved control. - Switch pantoprazole to Aciphex 20 mg daily. - Continue famotidine 20 mg in the evening. - Patient asked to send me a MyChart message to let me know if Aciphex is not effective to completely control her reflux  Constipation Chronic constipation with hard stools and overflow diarrhea. Colace provides partial relief. Miralax considered for efficacy without cramps. - Initiate Miralax. Adjust dosage based on symptoms.  Colonoscopy Screening Colonoscopy due; last in 2015. Procedure at hospital due to nocturnal oxygen use. Discussed potential for final colonoscopy unless polyps found. - Schedule colonoscopy at the hospital. - Discontinue Ozempic one week prior.  Previously elevated liver enzymes Resolved and likely secondary to passive congestion with heart failure exacerbation in the setting of sepsis and COVID-19  30 minutes total spent today including patient facing time, coordination of care, reviewing medical history/procedures/pertinent radiology studies, and documentation of the encounter.

## 2024-01-30 ENCOUNTER — Encounter: Payer: Self-pay | Admitting: Nurse Practitioner

## 2024-01-30 ENCOUNTER — Ambulatory Visit: Payer: Medicare Other | Admitting: Nurse Practitioner

## 2024-01-30 VITALS — BP 120/80 | HR 64 | Ht 64.0 in | Wt 243.0 lb

## 2024-01-30 DIAGNOSIS — E890 Postprocedural hypothyroidism: Secondary | ICD-10-CM

## 2024-01-30 DIAGNOSIS — E041 Nontoxic single thyroid nodule: Secondary | ICD-10-CM

## 2024-01-30 NOTE — Progress Notes (Signed)
 Endocrinology Consult Note                                         01/30/2024, 11:32 AM  Subjective:   Subjective    Grace Delacruz is a 78 y.o.-year-old female patient being seen in consultation for hypothyroidism referred by Donetta Potts, MD.  She was recently hospitalized in August with COVID pneumonia, sepsis, UTI, and CHF.  During the hospitalization she had CT scan of chest which incidentally noted a 2.8 left thyroid nodule.  Patient reports she has 3/4 of her thyroid removed at age 32 years old from nodules on the thyroid.  She has been on Levothyroxine ever since, was previously on 125 mcg but reduced recently for dropping TSH numbers.   Past Medical History:  Diagnosis Date   Anxiety    Depression    DJD (degenerative joint disease)    Esophageal reflux    Essential hypertension    Fibrocystic breast disease    Hypothyroidism    Left bundle branch block    Transient - possibly rate related    Menopause    Osteoarthritis    PONV (postoperative nausea and vomiting)    pt. would like to have a scopolamine patch   Restless leg syndrome    Scoliosis    Type 2 diabetes mellitus (HCC)     Past Surgical History:  Procedure Laterality Date   BREAST BIOPSY     lt x3   CARPOMETACARPEL SUSPENSION PLASTY Left 03/08/2014   Procedure: LEFT THUMB CARPOMETACARPEL (CMC) SUSPENSION PLASTY WITH TRAPEZIUM EXCISION;  Surgeon: Wyn Forster., MD;  Location: [facility];  Service: Orthopedics;  Laterality: Left;   CATARACT EXTRACTION Bilateral    CESAREAN SECTION     CHOLECYSTECTOMY     COLONOSCOPY  03/26/2007   RMR:  Diffuse changes of proctocolitis as described above diminutive rectal polyp, cold biopsy/removed, pedunculated polyp of the splenic flexure hot snare removed.  Segmental biopsies the colon taken of the ileocecal valve ulcer biopsied normal terminal ileum   COLONOSCOPY N/A 01/05/2014    Procedure: COLONOSCOPY;  Surgeon: Malissa Hippo, MD;  Location: [facility];  Service: Endoscopy;  Laterality: N/A;  830   EYE SURGERY     GASTROCNEMIUS RECESSION Right 08/07/2016   Procedure: GASTROCNEMIUS RECESSION;  Surgeon: Nadara Mustard, MD;  Location: [facility];  Service: Orthopedics;  Laterality: Right;   Joint fusion-foot     x2 LEFT FOOT   KNEE ARTHROPLASTY  2001   Right   KNEE ARTHROPLASTY  2002   Left   LEFT HEART CATH AND CORONARY ANGIOGRAPHY N/A 06/18/2018   Procedure: LEFT HEART CATH AND CORONARY ANGIOGRAPHY;  Surgeon: Kathleene Hazel, MD;  Location: [facility];  Service: Cardiovascular;  Laterality: N/A;   Left leg vein resection     RETINAL DETACHMENT SURGERY Left 02/25/2022   done in [facility]   ROTATOR CUFF REPAIR     LEFT   THYROIDECTOMY  1980  3/4   TONSILLECTOMY     TUBAL LIGATION     WEIL OSTEOTOMY Right 08/07/2016   Procedure: WEIL OSTEOTOMY 2nd and 3rd Metatarsal Right Foot;  Surgeon: Nadara Mustard, MD;  Location: [facility];  Service: Orthopedics;  Laterality: Right;   WEIL OSTEOTOMY Left 12/25/2016   Procedure: 2nd Toe Proximal Interphalangeal Resection, Weil Osteotomy 2nd and 3rd Metatarsal Left Foot;  Surgeon: Nadara Mustard, MD;  Location: [facility];  Service: Orthopedics;  Laterality: Left;    Social History   Socioeconomic History   Marital status: Married    Spouse name: Not on file   Number of children: 1   Years of education: Not on file   Highest education level: Not on file  Occupational History   Occupation: retired  Tobacco Use   Smoking status: Never   Smokeless tobacco: Never  Vaping Use   Vaping status: Never Used  Substance and Sexual Activity   Alcohol use: Yes    Alcohol/week: 0.0 standard drinks of alcohol    Comment: rare-- glass of wine at christmas   Drug use: No   Sexual activity: Not on file  Other Topics Concern   Not on file  Social History Narrative   Not on file   Social Drivers of Health   Financial  Resource Strain: Low Risk  (06/10/2023)   Received from [facility]   Overall Financial Resource Strain (CARDIA)    Difficulty of Paying Living Expenses: Not hard at all  Food Insecurity: No Food Insecurity (06/10/2023)   Received from [facility]   Hunger Vital Sign    Worried About Running Out of Food in the Last Year: Never true    Ran Out of Food in the Last Year: Never true  Transportation Needs: No Transportation Needs (06/10/2023)   Received from [facility] - Transportation    Lack of Transportation (Medical): No    Lack of Transportation (Non-Medical): No  Physical Activity: Not on file  Stress: Not on file  Social Connections: Not on file    Family History  Problem Relation Age of Onset   Hypertension Mother    Heart failure Father        Died with pneumonia   Hypertension Father    Arthritis Father    Diabetes Father    COPD Brother    Asthma Paternal Grandfather    Emphysema Paternal Grandfather    Colon cancer Neg Hx    Esophageal cancer Neg Hx    Stomach cancer Neg Hx    Colon polyps Neg Hx    Pancreatic cancer Neg Hx     Outpatient Encounter Medications as of 01/30/2024  Medication Sig   aspirin EC 81 MG tablet Take 81 mg by mouth at bedtime.    ciprofloxacin (CIPRO) 500 MG tablet Take 500 mg by mouth 2 (two) times daily.   Coenzyme Q10 (COQ-10) 100 MG CAPS Take 1 capsule by mouth daily.   docusate sodium (COLACE) 100 MG capsule Take 200 mg by mouth at bedtime.   famotidine (PEPCID) 20 MG tablet One after supper   FLUoxetine (PROZAC) 20 MG capsule Take 20 mg by mouth every morning.    furosemide (LASIX) 40 MG tablet Take 40 mg by mouth daily.   gabapentin (NEURONTIN) 100 MG capsule Take 100 mg by mouth 2 (two) times daily.   levothyroxine (SYNTHROID) 88 MCG tablet Take 1 tablet (88 mcg total) by mouth daily.   loratadine (CLARITIN)  10 MG tablet Take 10 mg by mouth daily.   losartan (COZAAR) 100 MG tablet Take 1 tablet (100 mg total) by  mouth every evening.   meclizine (ANTIVERT) 25 MG tablet Take 12.5 mg by mouth at bedtime as needed.   metoprolol succinate (TOPROL-XL) 100 MG 24 hr tablet Take 100 mg by mouth every morning. Take with or immediately following a meal.   Multiple Vitamin (MULTIVITAMIN) tablet Take 1 tablet by mouth daily.   [EXPIRED] Na Sulfate-K Sulfate-Mg Sulfate concentrate (SUPREP) 17.5-3.13-1.6 GM/177ML SOLN Take 1 kit (354 mLs total) by mouth once for 1 dose.   OXYGEN Inhale 2 L into the lungs at bedtime.   OZEMPIC, 0.25 OR 0.5 MG/DOSE, 2 MG/1.5ML SOPN Inject 1 mL into the skin once a week.   potassium chloride (KLOR-CON) 10 MEQ tablet Take 10 mEq by mouth daily.   RABEprazole (ACIPHEX) 20 MG tablet Take 1 tablet (20 mg total) by mouth daily.   simvastatin (ZOCOR) 80 MG tablet Take 1 tablet by mouth at bedtime.   triamcinolone cream (KENALOG) 0.5 % Apply 1 application topically 3 (three) times daily as needed. For rash/irritation.   vitamin C (ASCORBIC ACID) 500 MG tablet Take 500 mg by mouth daily.   Vitamin D, Cholecalciferol, 1000 UNITS CAPS Take 1,000 mg by mouth 2 (two) times daily.   No facility-administered encounter medications on file as of 01/30/2024.    ALLERGIES: Allergies  Allergen Reactions   Ephedrine Palpitations    Heart rate fast-   Hibiclens [Chlorhexidine Gluconate] Rash    SKIN REDNESS AND BURNING SENSATION    NO CHG   Keflex [Cephalexin] Itching and Rash   VACCINATION STATUS: Immunization History  Administered Date(s) Administered   Influenza,inj,Quad PF,6+ Mos 07/26/2013   Influenza-Unspecified 05/21/2014   Pneumococcal Conjugate-13 10/22/2011   Pneumococcal Polysaccharide-23 07/28/2012   Td (Adult),5 Lf Tetanus Toxid, Preservative Free 01/23/2013   Zoster Recombinant(Shingrix) 02/08/2019, 05/08/2019     HPI   ALETHEA TERHAAR  is a patient with the above medical history. she was diagnosed with hypothyroidism at approximate age of 12 years after she had 3/4 of her  thyroid removed due to nodules, which required subsequent initiation of thyroid hormone replacement therapy. she was given various doses of Levothyroxine over the years, currently on 88 micrograms. she reports compliance to this medication but does note she takes it with her other morning medications.  She was also incidentally noted to have a 2.8 cm thyroid nodule on the left thyroid lobe on 06/06/23 on a CT scan.  I reviewed patient's thyroid tests:  Lab Results  Component Value Date   TSH 0.989 01/26/2024   TSH 0.144 (L) 07/30/2023   TSH 0.09 (A) 06/26/2023   FREET4 1.32 01/26/2024   FREET4 1.35 07/30/2023     Pt describes: - tremors - occasional constipation - temperature fluctuations - hoarseness  Pt denies feeling nodules in neck, dysphagia/odynophagia, SOB with lying down.  she denies know family history of thyroid disorders, no family history of thyroid cancer.  No history of radiation therapy to head or neck.  No recent use of iodine supplements.  Denies use of Biotin containing supplements other than her womens + 50 MVI.  I reviewed her chart and she also has a history of GERD, HTN, DM, CHF, Depression, Scoliosis.   Review of systems  Constitutional: + Minimally fluctuating body weight,  current Body mass index is 41.71 kg/m. , no fatigue, no subjective hyperthermia, no subjective hypothermia Eyes: no blurry vision,  no xerophthalmia ENT: no sore throat, no nodules palpated in throat, no dysphagia/odynophagia, no hoarseness Cardiovascular: no chest pain, no shortness of breath, no palpitations, no leg swelling Respiratory: no cough, + shortness of breath- wears oxygen at night Gastrointestinal: no nausea/vomiting/diarrhea Musculoskeletal: no muscle/joint aches Skin: no rashes, no hyperemia Neurological: no tremors, no numbness, no tingling, no dizziness Psychiatric: no depression, no anxiety   Objective:   Objective     There were no vitals taken for this  visit. Wt Readings from Last 3 Encounters:  01/29/24 242 lb (109.8 kg)  01/12/24 244 lb (110.7 kg)  12/24/23 240 lb 8 oz (109.1 kg)    BP Readings from Last 3 Encounters:  01/29/24 108/68  01/12/24 (!) 144/84  12/24/23 (!) 146/77      Physical Exam- Limited  Constitutional:  Body mass index is 41.71 kg/m. , not in acute distress, normal state of mind Eyes:  EOMI, no exophthalmos Musculoskeletal: no gross deformities, strength intact in all four extremities, no gross restriction of joint movements Skin:  no rashes, no hyperemia Neurological: no tremor with outstretched hands   CMP ( most recent) CMP     Component Value Date/Time   NA 142 06/12/2018 1151   K 3.9 06/12/2018 1151   CL 105 06/12/2018 1151   CO2 29 06/12/2018 1151   GLUCOSE 157 (H) 06/12/2018 1151   BUN 17 06/12/2018 1151   CREATININE 1.17 (H) 06/12/2018 1151   CALCIUM 9.7 06/12/2018 1151   PROT 6.5 12/24/2023 1057   GFRNONAA 46 (L) 06/12/2018 1151     Diabetic Labs (most recent): Lab Results  Component Value Date   HGBA1C 6.6 (H) 12/20/2016   HGBA1C 6.7 (H) 08/05/2016     Lipid Panel ( most recent) Lipid Panel  No results found for: "CHOL", "TRIG", "HDL", "CHOLHDL", "VLDL", "LDLCALC", "LDLDIRECT", "LABVLDL"     Lab Results  Component Value Date   TSH 0.989 01/26/2024   TSH 0.144 (L) 07/30/2023   TSH 0.09 (A) 06/26/2023   FREET4 1.32 01/26/2024   FREET4 1.35 07/30/2023    Thyroid US from 08/04/23 CLINICAL DATA:  Left thyroid nodule seen on recent CT   Prior partial right thyroidectomy   EXAM: THYROID ULTRASOUND   TECHNIQUE: Ultrasound examination of the thyroid gland and adjacent soft tissues was performed.   COMPARISON:  None available.   FINDINGS: Parenchymal Echotexture: Mildly heterogenous   Isthmus: 0.4 cm   Right lobe: No abnormality of the right thyroidectomy bed   Left lobe: 6.6 x 2.7 x 3.8 cm   _________________________________________________________    Estimated total number of nodules >/= 1 cm: 1   Number of spongiform nodules >/=  2 cm not described below (TR1): 0   Number of mixed cystic and solid nodules >/= 1.5 cm not described below (TR2): 0   _________________________________________________________   Nodule # 1:   Location: Left; mid   Maximum size: 3.3 cm; Other 2 dimensions: 2.9 x 2.7 cm   Composition: solid/almost completely solid (2)   Echogenicity: isoechoic (1)   Shape: not taller-than-wide (0)   Margins: ill-defined (0)   Echogenic foci: none (0)   ACR TI-RADS total points: 3.   ACR TI-RADS risk category: TR3 (3 points).   ACR TI-RADS recommendations:   **Given size (>/= 2.5 cm) and appearance, fine needle aspiration of this mildly suspicious nodule should be considered based on TI-RADS criteria.   IMPRESSION: Nodule 1 (TI-RADS 3), measuring 3.3 cm, located in the mid left thyroid lobe, meets  criteria for FNA.   The above is in keeping with the ACR TI-RADS recommendations - J Am Coll Radiol 2017;14:587-595.     Electronically Signed   By: Acquanetta Belling M.D.   On: 08/05/2023 12:58      Latest Reference Range & Units 06/26/23 00:00 07/30/23 11:00 01/26/24 11:16  TSH 0.450 - 4.500 uIU/mL 0.09 ! (E) 0.144 (L) 0.989  T4,Free(Direct) 0.82 - 1.77 ng/dL  8.41 6.60  Thyroperoxidase Ab SerPl-aCnc 0 - 34 IU/mL  <9   Thyroglobulin Antibody 0.0 - 0.9 IU/mL  <1.0   !: Data is abnormal (L): Data is abnormally low (E): External lab result  Assessment & Plan:   ASSESSMENT / PLAN:  1. Hypothyroidism-postsurgical 2. Thyroid nodules  Thyroid antibodies were negative, ruling out autoimmune thyroid dysfunction. -Patient with long-standing hypothyroidism, on levothyroxine therapy.   -Her previsit TFTs are consistent with appropriate hormone replacement.  She is advised to continue Levothyroxine 88 mcg po daily before breakfast.  Will recheck labs prior to next visit and adjust dose accordingly.  - We  discussed about correct intake of levothyroxine, at fasting, with water, separated by at least 30 minutes from breakfast, and separated by more than 4 hours from calcium, iron, multivitamins, acid reflux medications (PPIs). -Patient is made aware of the fact that thyroid hormone replacement is needed for life, dose to be adjusted by periodic monitoring of thyroid function tests.  Her thyroid ultrasound did show 3.3 cm nodule in the left mid thyroid gland which did meet criteria for FNA to rule out malignancy.  She did have this which was consistent with benign follicular nodule.  Will plan to repeat thyroid ultrasound in 1 year for surveillance (around 07/2024).  At the patients request, she wanted referral to general surgeon to discuss potential need for removal of her remaining part of her thyroid (previous right thyroidectomy).  They did not feel that her symptoms with difficulty breathing were related to her thyroid problem and recommended conservative management with [place] follow up.   I spent  20  minutes in the care of the patient today including review of labs from Thyroid Function, CMP, and other relevant labs ; imaging/biopsy records (current and previous including abstractions from other facilities); face-to-face time discussing  her lab results and symptoms, medications doses, her options of short and long term treatment based on the latest standards of care / guidelines;   and documenting the encounter.  Tye [place]  participated in the discussions, expressed understanding, and voiced agreement with the above plans.  All questions were answered to her satisfaction. she is encouraged to contact clinic should she have any questions or concerns prior to her return visit.   FOLLOW UP PLAN:  No follow-ups on file.  Ronny Bacon, [facility] [address] [place], [place] [address] Phone: [phone] Fax: [phone]  01/30/2024, 11:32 AM

## 2024-01-30 NOTE — Patient Instructions (Signed)

## 2024-02-02 ENCOUNTER — Ambulatory Visit
Admission: RE | Admit: 2024-02-02 | Discharge: 2024-02-02 | Disposition: A | Discharge status: Home / Self Care | Source: Ambulatory Visit | Attending: Neurology | Admitting: Neurology

## 2024-02-02 DIAGNOSIS — R208 Other disturbances of skin sensation: Secondary | ICD-10-CM

## 2024-02-02 DIAGNOSIS — R413 Other amnesia: Secondary | ICD-10-CM

## 2024-02-03 ENCOUNTER — Encounter: Payer: Self-pay | Admitting: Neurology

## 2024-02-10 DIAGNOSIS — G4730 Sleep apnea, unspecified: Secondary | ICD-10-CM | POA: Diagnosis not present

## 2024-02-10 DIAGNOSIS — R0902 Hypoxemia: Secondary | ICD-10-CM | POA: Diagnosis not present

## 2024-02-12 ENCOUNTER — Ambulatory Visit: Payer: Medicare Other | Admitting: Internal Medicine

## 2024-02-16 DIAGNOSIS — E039 Hypothyroidism, unspecified: Secondary | ICD-10-CM | POA: Diagnosis not present

## 2024-02-16 DIAGNOSIS — K219 Gastro-esophageal reflux disease without esophagitis: Secondary | ICD-10-CM | POA: Diagnosis not present

## 2024-02-16 DIAGNOSIS — E1165 Type 2 diabetes mellitus with hyperglycemia: Secondary | ICD-10-CM | POA: Diagnosis not present

## 2024-02-18 DIAGNOSIS — I10 Essential (primary) hypertension: Secondary | ICD-10-CM | POA: Diagnosis not present

## 2024-02-18 DIAGNOSIS — E119 Type 2 diabetes mellitus without complications: Secondary | ICD-10-CM | POA: Diagnosis not present

## 2024-02-18 DIAGNOSIS — E1169 Type 2 diabetes mellitus with other specified complication: Secondary | ICD-10-CM | POA: Diagnosis not present

## 2024-02-18 DIAGNOSIS — E039 Hypothyroidism, unspecified: Secondary | ICD-10-CM | POA: Diagnosis not present

## 2024-02-18 DIAGNOSIS — E876 Hypokalemia: Secondary | ICD-10-CM | POA: Diagnosis not present

## 2024-02-18 DIAGNOSIS — R300 Dysuria: Secondary | ICD-10-CM | POA: Diagnosis not present

## 2024-02-18 NOTE — Telephone Encounter (Signed)
 Copied from CRM [id number]. Topic: Clinical - Lab/Test Results >> Feb 18, 2024 11:47 AM Grace Delacruz wrote: Reason for CRM: Patient is requesting to speak to someone in regards to her oximeter study results.

## 2024-02-18 NOTE — Telephone Encounter (Signed)
 She has had an O2 concentrator from [employer],but didn't like it- too noisy. As of las ov in March we had ordered an updated overnight oximetry, but I don't see that we have gotten that report- presumably from [employer]. Please check with patient to see if that result is why she is calling, and see if we can get the DME to send me a report.

## 2024-02-25 DIAGNOSIS — E1165 Type 2 diabetes mellitus with hyperglycemia: Secondary | ICD-10-CM | POA: Diagnosis not present

## 2024-02-25 DIAGNOSIS — E039 Hypothyroidism, unspecified: Secondary | ICD-10-CM | POA: Diagnosis not present

## 2024-02-25 DIAGNOSIS — K219 Gastro-esophageal reflux disease without esophagitis: Secondary | ICD-10-CM | POA: Diagnosis not present

## 2024-02-25 DIAGNOSIS — L989 Disorder of the skin and subcutaneous tissue, unspecified: Secondary | ICD-10-CM | POA: Diagnosis not present

## 2024-03-02 DIAGNOSIS — J019 Acute sinusitis, unspecified: Secondary | ICD-10-CM | POA: Diagnosis not present

## 2024-03-02 DIAGNOSIS — Z112 Encounter for screening for other bacterial diseases: Secondary | ICD-10-CM | POA: Diagnosis not present

## 2024-03-02 DIAGNOSIS — J029 Acute pharyngitis, unspecified: Secondary | ICD-10-CM | POA: Diagnosis not present

## 2024-03-02 DIAGNOSIS — J40 Bronchitis, not specified as acute or chronic: Secondary | ICD-10-CM | POA: Diagnosis not present

## 2024-03-03 DIAGNOSIS — R059 Cough, unspecified: Secondary | ICD-10-CM | POA: Diagnosis not present

## 2024-03-03 DIAGNOSIS — R509 Fever, unspecified: Secondary | ICD-10-CM | POA: Diagnosis not present

## 2024-03-03 DIAGNOSIS — Z5321 Procedure and treatment not carried out due to patient leaving prior to being seen by health care provider: Secondary | ICD-10-CM | POA: Diagnosis not present

## 2024-03-08 ENCOUNTER — Encounter: Payer: Self-pay | Admitting: Internal Medicine

## 2024-03-08 DIAGNOSIS — J209 Acute bronchitis, unspecified: Secondary | ICD-10-CM | POA: Diagnosis not present

## 2024-03-09 NOTE — Telephone Encounter (Signed)
 I got the overnight oximetry report from [employer], done 02/10/24 on room air. This showed 2 brief intervals when oxygen saturation dipped below 88%. It is little enough, that I think it would be safe for you to see how you feel sleeping without the oxygen. We had talked before about looking into a fitted oral appliance as an alternative to CPAP for treating sleep apnea. I suggest we order Orthodontic referral to Dr Kalman Ores, DDS. He is an expert at working with these oral appliances. If you agree, we can refer you. That gives you a chance to ask him questions. It does not commit you to getting an oral appliance unless you decide you want to, but it is an alternative to CPAP.

## 2024-03-10 DIAGNOSIS — J209 Acute bronchitis, unspecified: Secondary | ICD-10-CM | POA: Diagnosis not present

## 2024-03-10 DIAGNOSIS — J019 Acute sinusitis, unspecified: Secondary | ICD-10-CM | POA: Diagnosis not present

## 2024-03-11 DIAGNOSIS — E1169 Type 2 diabetes mellitus with other specified complication: Secondary | ICD-10-CM | POA: Diagnosis not present

## 2024-03-11 DIAGNOSIS — E039 Hypothyroidism, unspecified: Secondary | ICD-10-CM | POA: Diagnosis not present

## 2024-03-11 DIAGNOSIS — E1165 Type 2 diabetes mellitus with hyperglycemia: Secondary | ICD-10-CM | POA: Diagnosis not present

## 2024-03-11 DIAGNOSIS — J209 Acute bronchitis, unspecified: Secondary | ICD-10-CM | POA: Diagnosis not present

## 2024-03-11 DIAGNOSIS — R911 Solitary pulmonary nodule: Secondary | ICD-10-CM | POA: Diagnosis not present

## 2024-03-11 DIAGNOSIS — J9811 Atelectasis: Secondary | ICD-10-CM | POA: Diagnosis not present

## 2024-03-11 DIAGNOSIS — E876 Hypokalemia: Secondary | ICD-10-CM | POA: Diagnosis not present

## 2024-03-12 DIAGNOSIS — J019 Acute sinusitis, unspecified: Secondary | ICD-10-CM | POA: Diagnosis not present

## 2024-03-12 DIAGNOSIS — E039 Hypothyroidism, unspecified: Secondary | ICD-10-CM | POA: Diagnosis not present

## 2024-03-12 DIAGNOSIS — J209 Acute bronchitis, unspecified: Secondary | ICD-10-CM | POA: Diagnosis not present

## 2024-03-16 ENCOUNTER — Encounter (HOSPITAL_COMMUNITY): Payer: Self-pay | Admitting: Physician Assistant

## 2024-03-16 ENCOUNTER — Encounter (HOSPITAL_COMMUNITY): Payer: Self-pay | Admitting: Internal Medicine

## 2024-03-16 ENCOUNTER — Telehealth: Payer: Self-pay

## 2024-03-16 NOTE — Telephone Encounter (Signed)
Pt's procedure cancelled.

## 2024-03-16 NOTE — Progress Notes (Signed)
 Called pt for pre op for endo procedure 6/2, patient reports being sick for the last few weeks, still having congestion, and making appt to see her pulmonologist. Since still symptomatic, hospital policy is two weeks she needs to have no respiratory illness symptoms. Notified her she will need to be rescheduled and I let office know same. The GI office will call to reschedule.

## 2024-03-17 ENCOUNTER — Encounter: Payer: Self-pay | Admitting: Nurse Practitioner

## 2024-03-17 ENCOUNTER — Ambulatory Visit: Admitting: Internal Medicine

## 2024-03-17 ENCOUNTER — Encounter: Payer: Self-pay | Admitting: Internal Medicine

## 2024-03-17 VITALS — BP 166/72 | HR 69 | Ht 64.0 in | Wt 233.6 lb

## 2024-03-17 DIAGNOSIS — R053 Chronic cough: Secondary | ICD-10-CM

## 2024-03-17 DIAGNOSIS — R0609 Other forms of dyspnea: Secondary | ICD-10-CM | POA: Diagnosis not present

## 2024-03-17 MED ORDER — METHYLPREDNISOLONE ACETATE 80 MG/ML IJ SUSP
120.0000 mg | Freq: Once | INTRAMUSCULAR | Status: AC
  Administered 2024-03-17: 120 mg via INTRAMUSCULAR

## 2024-03-17 MED ORDER — GABAPENTIN 100 MG PO CAPS: 100.0000 mg | ORAL_CAPSULE | Freq: Four times a day (QID) | ORAL | 2 refills | Status: AC

## 2024-03-17 NOTE — Progress Notes (Unsigned)
 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 female who presented to the ED on 8/17 with diagnosis of COVID 19 pneumonia. Initial troponin 330, trended up to 689, then back down to 555. [facility] cardiology was consulted who did not feel the elevated troponin was indicative of ACS or NSTEMI. Patient was placed on BiPAP due to increased work of breathing and tachypnea and was treated with IV Lasix. Admitted to ICU for treatment and management. She was aggressively diuresed and was moved out of ICU on 8/20 as she continued to improve. Oxygen was gradually weaned. Oxygen testing 8/21 showed no need for exertional oxygen. Today she is feeling well and feels she is ready for discharge home.      COVID-19 virus infection, pneumonia of both lungs due to infectious organism, acute hypoxic respiratory failure Chest x-ray 8/16 shows right greater than left airspace disease and consolidation suspicious for pneumonia CTA rules out PE Was started on vancomycin  and cefepime, de-escalated to Cipro 8/19 MRSA screening negative White blood count 13.3, patient is receiving steroids IV She is afebrile  Requiring only 1 L of oxygen Encouraged incentive spirometer and Acapella Neb treatments, mucinex, remdesivir  day 5, vitamin regimen Repeat chest x ray 8/21 shows no abnormalities, fluid/edema has cleared 6 min walk test 8/21 shows no need for oxygen supplementation  2. Sepsis with organ dysfunction Due to COVID-19  pneumonia As evidenced by need for BiPAP, creatinine 1.5 times baseline, white blood count 20.3 initially Hypotensive, tachynpeic Blood cultures negative Sepsis resolved  3. Elevated troponins Thought to be due to demand, patient with fluid overload upon arrival Cardiology followed along while admitted  Troponins have trended down Patient is not having chest pain  4. HTN BP high at times while admitted Prn hydralazine in place Toprol home dose resumed, Norvasc  dosage increased from 2.5mg  daily to 5mg  daily  5. Type 2 diabetes SSI, Lantus 5 units nightly while admitted Glucose 182-251 in the past 24 hours A1C 6.0 Resume Ozempic, home regimen at discharge  6. AKI Creatinine 1.59 8/18, down to 0.99 today Has been getting IV Lasix, to change to PO home dosage tomorrow Output has been excellent  7. Acute cystitis Urine culture shows E. coli sensitive to Cipro Changed IV antibiotics to Cipro due to cephalexin allergy Received 2 days of cefepime, today is day 6 of treatment Patient has frequent UTIs Continue 4 additional days of Cipro outpatient, rx electronically sent to [facility] in [place]  8. Fluid overload Resolved Received aggressive IV diuresis Strict intake and output shows excellent output Updated echo 8/20, showed preserved EF of >55%    History of Present Illness  07/16/2023  Pulmonary/ 1st office eval/Grace Delacruz  Chief Complaint  Patient presents with   Establish Care    Abn pulm imaging  Baseline ex slower pace last several years but could  shop for at least 30 min at s stopping due to doe  PTA Dyspnea:  physical therapy twice a week plus  ex her own but not checking sats while ex   Cough: resolved  Sleep: 10-15 degrees HOB new setting since covid  SABA use: none Rec My office will be contacting you by phone for referral for Overnight 02 on Room air  > done 08/28/23 (see below)      08/27/2023  f/u ov/Grace Delacruz office/Grace Delacruz re: post covid maint on RA   / no resp  rx Chief Complaint  Patient presents with   Shortness of Breath  Dyspnea:  whole store including [employer] / sams s problem pushing cart and sat 94% s limited doe / legs get tired @ wt same as baseline  Cough: occ noct cough with your usual wheeze  Sleeping: 10 degrees s    resp cc  SABA use: none  02: none Rec Try lopressor 100 mg one half in am and pm  Add pepcid  20 mg taken after supper (over the counter) GERD diet reviewed, bed blocks rec    To get the most out of exercise, you need to be continuously aware that you are short of breath, but never out of breath, for at least 30 minutes daily.   Make sure you check your oxygen saturations at highest level of activity   If not improved to your satisfaction return after the holidays for re-evaluation    ONO on Aug 28 2023  desat x 2 h 8 min @ < 89%  >  09/08/2023   rec 2lpm and repeat on 2lpm performed  on 16th Dec [employer] > not available as of 10/21/2023    10/21/2023  f/u ov/[place] office/Grace Delacruz re: noct hypoxemia  maint on 2lpm  post covid  Chief Complaint  Patient presents with   Follow-up  Dyspnea:  target and sams ok consistently low 90s RA Cough: sometimes choking sensation x years assoc hoarseness esp in am s obvious sinus cc  Sleeping: electric bed around 30 degrees helps a lot  SABA use: no  02: just 2lpm sleeping only  Rec Pantoprazole  (protonix ) 40 mg   Take  30-60 min before first meal of the day and Pepcid  (famotidine )  20 mg after supper until return to office x  at least 6 weeks and if choking and hoarseness continue will need ENT evaluation next  GERD diet My office will be contacting you by phone for referral to sleep medicine evaluation  in [facility]    Make sure you check your oxygen saturation at your highest level of activity(NOT after you stop)  to be sure it stays over 90%  We will find out what happened to your oxygen levels while sleeping on 02 on Dec 16 but for now please continue same level  Pulmonary  clinic follow up here is as needed   Mid may onset ST nasal congestion green > doxy / augmentin / then advair  > trelegy rec next   Mar 11 2024  4mm nodule  03/17/2024  f/u ov/[place] office/Ashanti Ratti re: URI / maint on advair new start   Chief Complaint  Patient presents with   Shortness of Breath  Dyspnea:  still able to do SAMS  Cough: mucus is yellowish / sinuses feel better no ST  Sleeping: 30 degrees/ on 02 x 2lpm x one pillow    resp cc  SABA use: albuterol up 2 x daily  02: just hs     No obvious day to day or daytime variability or assoc excess/ purulent sputum or mucus plugs or hemoptysis or cp  or chest tightness, subjective wheeze or overt sinus or hb symptoms.    Also denies any obvious fluctuation of symptoms with weather or environmental changes or other aggravating or alleviating factors except as outlined above   No unusual exposure hx or h/o childhood pna/ asthma or knowledge of premature birth.  Current Allergies, Complete Past Medical History, Past Surgical History, Family History, and Social History were reviewed in [employer] record.  ROS  The following are not active complaints unless bolded Hoarseness, sore throat, dysphagia, dental problems, itching, sneezing,  nasal congestion or discharge of excess mucus or purulent secretions, ear ache,   fever, chills, sweats, unintended wt loss or wt gain, classically pleuritic or exertional cp,  orthopnea pnd or arm/hand swelling  or leg swelling, presyncope, palpitations, abdominal pain, anorexia, nausea, vomiting, diarrhea  or change in bowel habits or change in bladder habits, change in stools or change in urine, dysuria, hematuria,  rash, arthralgias, visual complaints, headache, numbness, weakness or ataxia or problems with walking or coordination,  change in mood or  memory.        Current Meds  Medication Sig   albuterol (VENTOLIN HFA) 108 (90 Base) MCG/ACT inhaler Inhale 2 puffs into the lungs  every 4 (four) hours as needed.   aspirin  EC 81 MG tablet Take 81 mg by mouth at bedtime.    Coenzyme Q10 (COQ-10) 100 MG CAPS Take 1 capsule by mouth daily.   docusate sodium (COLACE) 100 MG capsule Take 200 mg by mouth at bedtime.   famotidine  (PEPCID ) 20 MG tablet One after supper   FLUoxetine (PROZAC) 20 MG capsule Take 20 mg by mouth every morning.    furosemide (LASIX) 40 MG tablet Take 40 mg by mouth daily.   gabapentin  (NEURONTIN ) 100 MG capsule Take 100 mg by mouth 2 (two) times daily.   levothyroxine  (SYNTHROID ) 88 MCG tablet Take 1 tablet (88 mcg total) by mouth daily.   loratadine (CLARITIN) 10 MG tablet Take 10 mg by mouth daily.   losartan  (COZAAR ) 100 MG tablet Take 1 tablet (100 mg total) by mouth every evening.   meclizine (ANTIVERT) 25 MG tablet Take 12.5 mg by mouth at bedtime as needed.   metoprolol succinate (TOPROL-XL) 100 MG 24 hr tablet Take 100 mg by mouth every morning. Take with or immediately following a meal.   Multiple Vitamin (MULTIVITAMIN) tablet Take 1 tablet by mouth daily.   OXYGEN Inhale 2 L into the lungs at bedtime.   OZEMPIC, 0.25 OR 0.5 MG/DOSE, 2 MG/1.5ML SOPN Inject 1 mL into the skin once a week.   pantoprazole  (PROTONIX ) 40 MG tablet Take 40 mg by mouth daily.   potassium chloride (KLOR-CON) 10 MEQ tablet Take 10 mEq by mouth daily.   RABEprazole  (ACIPHEX ) 20 MG tablet Take 1 tablet (20 mg total) by mouth daily.   simvastatin (ZOCOR) 80 MG tablet Take 1 tablet by mouth at bedtime.   triamcinolone  cream (KENALOG ) 0.5 % Apply 1 application topically 3 (three) times daily as needed. For rash/irritation.   vitamin C (ASCORBIC ACID) 500 MG tablet Take 500 mg by mouth daily.   Vitamin D, Cholecalciferol, 1000 UNITS CAPS Take 1,000 mg by mouth 2 (two) times daily.              Past Medical History:  Diagnosis Date   Anxiety    Depression    DJD (degenerative joint disease)    Esophageal reflux    Essential hypertension    Fibrocystic  breast  disease    Hypothyroidism    Left bundle branch block    Transient - possibly rate related    Menopause    Osteoarthritis    PONV (postoperative nausea and vomiting)    pt. would like to have a scopolamine  patch   Restless leg syndrome    Scoliosis    Type 2 diabetes mellitus (HCC)       Objective:    Wts  03/17/2024        ***  10/21/2023     243   08/27/23 237 lb (107.5 kg)  07/25/23 237 lb 12.8 oz (107.9 kg)  07/16/23 236 lb (107 kg)     Vital signs reviewed  03/17/2024  - Note at rest 02 sats  ***% on ***   General appearance:    ***          Assessment

## 2024-03-17 NOTE — Patient Instructions (Addendum)
 Depomedrol 120 mg IM   Stop advair and just use albuterol as needed for breathing   For cough delsym 2 tsp every 12 hours supplement with the 200 mg Tessalon very 4-6 hours as need  Pantoprazole  and Rabeprazole  are ppi's are best taken Take 30- 60 min before your first and last meals of the day and pepcid  20mg  taken at bedtime    Gabapentin  100 mg four times daily and then every few days add one with a maximum of 1200  mg per day   GERD (REFLUX)  is an extremely common cause of respiratory symptoms just like yours , many times with no obvious heartburn at all.    It can be treated with medication, but also with lifestyle changes including elevation of the head of your bed (ideally with 6 -8inch blocks under the headboard of your bed),  Smoking cessation, avoidance of late meals, excessive alcohol , and avoid fatty foods, chocolate, peppermint, colas, red wine, and acidic juices such as orange juice.  NO MINT OR MENTHOL PRODUCTS SO NO COUGH - LUDENS   USE SUGARLESS CANDY INSTEAD (Jolley ranchers or Stover's or Life Savers) or even ice chips will also do - the key is to swallow to prevent all throat clearing. NO OIL BASED VITAMINS - use powdered substitutes.  Avoid fish oil when coughing.    Depomedrol 120 mg IM   Pulmonary follow up is as needed

## 2024-03-18 NOTE — Assessment & Plan Note (Signed)
 Onset x  "maybe a decade" prior to mentioning it in clinic 10/21/2023 - Eval  121/9/24  by  Dr Sofia Dunn for L Thyromegaly > no compression on Trachea  - rx max gerd rx 10/21/2023 and f/u ent prn  -  Mid May 2025 flare with URI / purulent nasal secretions rx with doxy then augmentin  - seen 03/17/2024 rec stop dpi inhalers and just use saba prn per hfa / rx max gerd and max gabapentin  up to 1200 mg per day if needed to eliminated cycle of cough then back to baseline rx.   Comment: Upper airway cough syndrome (previously labeled PNDS),  is so named because it's frequently impossible to sort out how much is  CR/sinusitis with freq throat clearing (which can be related to primary GERD)   vs  causing  secondary (" extra esophageal")  GERD from wide swings in gastric pressure that occur with throat clearing, often  promoting self use of mint and menthol lozenges that reduce the lower esophageal sphincter tone and exacerbate the problem further in a cyclical fashion.   These are the same pts (now being labeled as having "irritable larynx syndrome" by some cough centers) who not infrequently have a history of having failed to tolerate ace inhibitors,  dry powder inhalers or biphosphonates or report having atypical/extraesophageal reflux symptoms that don't respond to standard doses of PPI  and are easily confused as having aecopd or asthma flares by even experienced allergists/ pulmonologists (myself included).   Of the three most common causes of  Sub-acute / recurrent or chronic cough, only one (GERD)  can actually contribute to/ trigger  the other two (asthma and post nasal drip syndrome)  and perpetuate the cylce of cough.  While not intuitively obvious, many patients with chronic low grade reflux do not cough until there is a primary insult that disturbs the protective epithelial barrier and exposes sensitive nerve endings.   This is typically viral but can due to PNDS and  either may apply here.   The point  is that once this occurs, it is difficult to eliminate the cycle  using anything but a maximally effective acid suppression regimen at least in the short run, accompanied by an appropriate diet to address non acid GERD and control / eliminate the cough itself with gabapentin  and >>> also added deomedrol 120 mg IM  in case of component of Th-2 driven upper or lower airways inflammation (if cough responds short term only to relapse before return while will on full rx for uacs (as above), then that would point to allergic rhinitis/ asthma or eos bronchitis as alternative dx)    F/u can be prn          Each maintenance medication was reviewed in detail including emphasizing most importantly the difference between maintenance and prns and under what circumstances the prns are to be triggered using an action plan format where appropriate.  Total time for H and P, chart review, counseling, reviewing hfa  device(s) and generating customized AVS unique to this office visit / same day charting = 34 min

## 2024-03-19 ENCOUNTER — Encounter: Payer: Self-pay | Admitting: Internal Medicine

## 2024-03-19 DIAGNOSIS — K219 Gastro-esophageal reflux disease without esophagitis: Secondary | ICD-10-CM | POA: Diagnosis not present

## 2024-03-19 DIAGNOSIS — E1165 Type 2 diabetes mellitus with hyperglycemia: Secondary | ICD-10-CM | POA: Diagnosis not present

## 2024-03-19 DIAGNOSIS — E039 Hypothyroidism, unspecified: Secondary | ICD-10-CM | POA: Diagnosis not present

## 2024-03-22 ENCOUNTER — Ambulatory Visit (HOSPITAL_COMMUNITY): Admit: 2024-03-22 | Admitting: Internal Medicine

## 2024-03-22 ENCOUNTER — Encounter (HOSPITAL_COMMUNITY): Payer: Self-pay

## 2024-03-22 DIAGNOSIS — Z1211 Encounter for screening for malignant neoplasm of colon: Secondary | ICD-10-CM

## 2024-03-22 SURGERY — COLONOSCOPY
Anesthesia: Monitor Anesthesia Care

## 2024-03-25 ENCOUNTER — Encounter: Payer: Self-pay | Admitting: Cardiology

## 2024-03-25 ENCOUNTER — Ambulatory Visit: Attending: Cardiology | Admitting: Cardiology

## 2024-03-25 VITALS — BP 142/80 | HR 63 | Ht 64.0 in | Wt 235.0 lb

## 2024-03-25 DIAGNOSIS — I493 Ventricular premature depolarization: Secondary | ICD-10-CM

## 2024-03-25 DIAGNOSIS — I447 Left bundle-branch block, unspecified: Secondary | ICD-10-CM | POA: Diagnosis not present

## 2024-03-25 DIAGNOSIS — I10 Essential (primary) hypertension: Secondary | ICD-10-CM

## 2024-03-25 DIAGNOSIS — I2510 Atherosclerotic heart disease of native coronary artery without angina pectoris: Secondary | ICD-10-CM

## 2024-03-25 NOTE — Patient Instructions (Signed)

## 2024-03-25 NOTE — Progress Notes (Signed)
    Cardiology Office Note  Date: 03/25/2024   ID: Grace Delacruz, DOB Feb 19, 1946, MRN [id number]  History of Present Illness: Grace Delacruz is a 78 y.o. female last seen in January.  She is here with her husband for a routine visit.  She does not report any interval palpitations, no dizziness or syncope.  No exertional chest pain.  Recently got over suspected viral URI.  We went over her medications.  No specific change from a cardiac perspective.  Blood pressure rechecked by me at 142/80 today.  I asked her to continue to track pressures at home.  I reviewed her ECG today which shows sinus rhythm with left bundle branch block which is old.  Physical Exam: VS:  BP (!) 142/80 (BP Location: Left Arm)   Pulse 63   Ht 5\' 4"  (1.626 m)   Wt 235 lb (106.6 kg)   SpO2 98%   BMI 40.34 kg/m , BMI Body mass index is 40.34 kg/m.  Wt Readings from Last 3 Encounters:  03/25/24 235 lb (106.6 kg)  03/17/24 233 lb 9.6 oz (106 kg)  01/30/24 243 lb (110.2 kg)    General: Patient appears comfortable at rest. HEENT: Conjunctiva and lids normal. Neck: Supple, no elevated JVP or carotid bruits. Lungs: Clear to auscultation, nonlabored breathing at rest. Cardiac: Regular rate and rhythm, no S3 or significant systolic murmur. Extremities: No pitting edema.  ECG:  An ECG dated 06/09/2023 was personally reviewed today and demonstrated:  Sinus tachycardia with left bundle branch block.  Labwork: August 2024: Potassium 3.9, BUN 30, creatinine 0.99, GFR 59 October 2024: Hemoglobin A1c 5.8%, hemoglobin 13.6, platelets 236 01/26/2024: TSH 0.989   Other Studies Reviewed Today:  No interval cardiac testing for review today.  Assessment and Plan:  1.  Mild nonobstructive CAD by cardiac catheterization in 2019.  LVEF greater than 55% by follow-up echocardiogram at [facility] in August 2024.  She reports no angina and continues on medical therapy including aspirin  80 mg daily and Zocor 80 mg daily.    2.  Frequent PVCs and NSVT in the setting of normal LVEF.  Asymptomatic, heart rate regular today.  No dizziness or syncope.  Continue Toprol-XL 100 mg daily.   3.  Primary hypertension.  Pressure mildly elevated today.  She is on Cozaar  100 mg daily, also Lasix with potassium supplement.  Continue to track at home.  Disposition:  Follow up 6 months.  Signed, Gerard Knight, M.D., F.A.C.C. [facility] at [facility]

## 2024-03-30 ENCOUNTER — Ambulatory Visit: Payer: Medicare Other | Admitting: Family Medicine

## 2024-03-30 NOTE — Telephone Encounter (Signed)
 Inbound call from patient, would like for procedure to be rescheduled at the hospital. States it was canceled due to some congestion she was having.

## 2024-03-31 DIAGNOSIS — L309 Dermatitis, unspecified: Secondary | ICD-10-CM | POA: Diagnosis not present

## 2024-03-31 DIAGNOSIS — L98499 Non-pressure chronic ulcer of skin of other sites with unspecified severity: Secondary | ICD-10-CM | POA: Diagnosis not present

## 2024-03-31 DIAGNOSIS — C44319 Basal cell carcinoma of skin of other parts of face: Secondary | ICD-10-CM | POA: Diagnosis not present

## 2024-03-31 DIAGNOSIS — L570 Actinic keratosis: Secondary | ICD-10-CM | POA: Diagnosis not present

## 2024-03-31 NOTE — Telephone Encounter (Signed)
 Pts colon rescheduled at [facility] 06/08/24 at 1:30pm, arrival time 12noon. [id number]. Pt sent updated instructions via mychart, she is aware of appt.

## 2024-04-08 DIAGNOSIS — L98499 Non-pressure chronic ulcer of skin of other sites with unspecified severity: Secondary | ICD-10-CM | POA: Diagnosis not present

## 2024-04-08 DIAGNOSIS — D485 Neoplasm of uncertain behavior of skin: Secondary | ICD-10-CM | POA: Diagnosis not present

## 2024-04-08 DIAGNOSIS — L570 Actinic keratosis: Secondary | ICD-10-CM | POA: Diagnosis not present

## 2024-04-08 DIAGNOSIS — C44319 Basal cell carcinoma of skin of other parts of face: Secondary | ICD-10-CM | POA: Diagnosis not present

## 2024-04-12 DIAGNOSIS — R300 Dysuria: Secondary | ICD-10-CM | POA: Diagnosis not present

## 2024-04-12 NOTE — Progress Notes (Deleted)
 HPI 35 yoF for sleep evaluation with hx Nocturnal Hypoxemia. Saw Dr Darlean after Covid pneumonia, Respiratory Failure August, 2024. Did not desat on walk test. NPSG 11/05/13- AHI/ 2.4/hr, desat to 85%transiently, body weight 253 lbs Medical problem list includes HTN, DM2, Hypothyroid, Polyneuropathy, Morbid Obesity, Chronic Cough,  ONO on Aug 28 2023  desat x 2 h 8 min @ < 89%  >  09/08/2023   rec 2lpm and repeat on 2lpm performed  on 16th Dec [employer] - remained 90% and above all night. HST 12/07/23- AHI 11.5/hr, desat to 84%, time </= 88% was 13 minutes on room air O2 ordered [employer] 09/04/23- 2L sleep and POC PFT 12/02/23- WNL   =========================================================================================== 01/12/24- [name] for sleep evaluation with hx Nocturnal Hypoxemia. Saw Dr Darlean after Covid pneumonia, Respiratory Failure August, 2024. Did not desat on walk test. NPSG 11/05/13- AHI/ 2.4/hr, desat to 85%transiently, body weight 253 lbs Medical problem list includes HTN, DM2, Hypothyroid, Polyneuropathy, Morbid Obesity, Chronic Cough,  ONO on Aug 28 2023  desat x 2 h 8 min @ < 89%  >  09/08/2023   rec 2lpm and repeat on 2lpm performed  on 16th Dec [employer] - remained 90% and above all night. HST 12/07/23- AHI 11.5/hr, desat to 84%, time </= 88% was 13 minutes on room air O2 ordered [employer] 09/04/23- 2L sleep and POC PFT 12/02/23- WNL Body weight today-244  Discussed the use of AI scribe software for clinical note transcription with the patient, who gave verbal consent to proceed. History of Present Illness   The patient, with a history of sleep apnea and double pneumonia, expresses a desire to discontinue the use of nocturnal oxygen. The patient reports that the oxygen concentrator is noisy and inconvenient. The patient's sleep apnea was initially mild, with a score of 2-3 per hour in 2015. However, a more recent home sleep study in February showed an increase in sleep apnea episodes to 11-12  times per hour. The patient also spent about 18 minutes with an oxygen level of 88% or less. Despite these findings, the patient's pulmonary function test was within normal ranges. The patient's spouse reports that the patient experiences daytime tiredness, but the patient attributes this to her medication regimen. The patient also expresses concern about a previous episode of double pneumonia, which developed rapidly and was initially unnoticed.   Assessment and Plan:    Sleep Apnea Mild sleep apnea with AHI of 11-12 events/hour. Increased from 2015 study. Daytime somnolence noted. CPAP not tolerated. Considering oral appliance. - Recheck overnight oxygen levels with pulse oximeter. - If oxygen levels low, consider referral for fitted oral appliance. - Discuss costs and insurance for oral appliance.  Nocturnal Hypoxemia Previous oximetry showed oxygen levels <89% for over two hours. Recent study showed levels at or below 88% for 18 minutes. Using nocturnal oxygen therapy, concentrator malfunctioning. - Order service for oxygen concentrator. - Recheck overnight oxygen levels with pulse oximeter.  Oxygen Concentrator Malfunction Concentrator making noise and delayed function, indicating malfunction. Used only at night. - Order service for oxygen concentrator.  Follow-up Interested in reassessing nocturnal oxygen levels and exploring alternative sleep apnea treatments. Option for pulse oximeter with alarm and Smart Altamese for snoring. - Contact office if oximetry results not received in two weeks. - Consider alternative home care companies if current provider unsatisfactory. - Provide information on Smart Altamese for snoring.   04/13/24- [name] for sleep evaluation with hx Nocturnal Hypoxemia. Saw Dr Darlean after Covid pneumonia,  Respiratory Failure August, 2024. Did not desat on walk test. NPSG 11/05/13- AHI/ 2.4/hr, desat to 85%transiently, body weight 253 lbs Medical problem list includes HTN,  DM2, Hypothyroid, Polyneuropathy, Morbid Obesity, Chronic Cough,  ONO on Aug 28 2023  desat x 2 h 8 min @ < 89%  >  09/08/2023   rec 2lpm and repeat on 2lpm performed  on 16th Dec [employer] - remained 90% and above all night. HST 12/07/23- AHI 11.5/hr, desat to 84%, time </= 88% was 13 minutes on room air O2 ordered [employer] 09/04/23- 2L sleep and POC PFT 12/02/23- WNL Body weight today-244        ROS-see HPI   + = positive Constitutional:    weight loss, night sweats, fevers, chills, fatigue, lassitude. HEENT:    headaches, difficulty swallowing, tooth/dental problems, sore throat,       sneezing, itching, ear ache, nasal congestion, post nasal drip, snoring CV:    chest pain, orthopnea, PND, swelling in lower extremities, anasarca,                                   dizziness, palpitations Resp:   shortness of breath with exertion or at rest.                productive cough,   non-productive cough, coughing up of blood.              change in color of mucus.  wheezing.   Skin:    rash or lesions. GI:  No-   heartburn, indigestion, abdominal pain, nausea, vomiting, diarrhea,                 change in bowel habits, loss of appetite GU: dysuria, change in color of urine, no urgency or frequency.   flank pain. MS:   joint pain, stiffness, decreased range of motion, back pain. Neuro-     nothing unusual Psych:  change in mood or affect.  depression or anxiety.   memory loss.  OBJ- Physical Exam General- Alert, Oriented, Affect-appropriate, Distress- none acute, Obese Skin- rash-none, lesions- none, excoriation- none Lymphadenopathy- none Head- atraumatic            Eyes- Gross vision intact, PERRLA, conjunctivae and secretions clear            Ears- Hearing, canals-normal            Nose- Clear, no-Septal dev, mucus, polyps, erosion, perforation             Throat- Mallampati III-IV , mucosa clear , drainage- none, tonsils- absent, +teeth Neck- flexible , trachea midline, no stridor , thyroid   nl, carotid no bruit Chest - symmetrical excursion , unlabored           Heart/CV- RRR , no murmur , no gallop  , no rub, nl s1 s2                           - JVD- none , edema- none, stasis changes- none, varices- none           Lung- +few crackles, wheeze+trace, cough- none , dullness-none, rub- none           Chest wall-  Abd-  Br/ Gen/ Rectal- Not done, not indicated Extrem- cyanosis- none, clubbing, none, atrophy- none, strength- nl Neuro- grossly intact to observation

## 2024-04-13 ENCOUNTER — Ambulatory Visit: Admitting: Internal Medicine

## 2024-04-15 NOTE — Progress Notes (Signed)
 HPI F for sleep evaluation with hx Nocturnal Hypoxemia. Saw Dr Darlean after Covid pneumonia, Respiratory Failure August, 2024. Did not desat on walk test. NPSG 11/05/13- AHI/ 2.4/hr, desat to 85%transiently, body weight 253 lbs Medical problem list includes HTN, DM2, Hypothyroid, Polyneuropathy, Morbid Obesity, Chronic Cough,  ONO on Aug 28 2023  desat x 2 h 8 min @ < 89%  >  09/08/2023   rec 2lpm and repeat on 2lpm performed  on 16th Dec [employer] - remained 90% and above all night. HST 12/07/23- AHI 11.5/hr, desat to 84%, time </= 88% was 13 minutes on room air O2 ordered [employer] 09/04/23- 2L sleep and POC PFT 12/02/23- WNL  ===========================================================================================================   01/12/24- 78 yoF for sleep evaluation with hx Nocturnal Hypoxemia. Saw Dr Darlean after Covid pneumonia, Respiratory Failure August, 2024. Did not desat on walk test. NPSG 11/05/13- AHI/ 2.4/hr, desat to 85%transiently, body weight 253 lbs Medical problem list includes HTN, DM2, Hypothyroid, Polyneuropathy, Morbid Obesity, Chronic Cough,  ONO on Aug 28 2023  desat x 2 h 8 min @ < 89%  >  09/08/2023   rec 2lpm and repeat on 2lpm performed  on 16th Dec [employer] - remained 90% and above all night. HST 12/07/23- AHI 11.5/hr, desat to 84%, time </= 88% was 13 minutes on room air O2 ordered [employer] 09/04/23- 2L sleep and POC PFT 12/02/23- WNL Body weight today-244  Discussed the use of AI scribe software for clinical note transcription with the patient, who gave verbal consent to proceed. History of Present Illness   The patient, with a history of sleep apnea and double pneumonia, expresses a desire to discontinue the use of nocturnal oxygen. The patient reports that the oxygen concentrator is noisy and inconvenient. The patient's sleep apnea was initially mild, with a score of 2-3 per hour in 2015. However, a more recent home sleep study in February showed an increase in sleep apnea episodes  to 11-12 times per hour. The patient also spent about 18 minutes with an oxygen level of 88% or less. Despite these findings, the patient's pulmonary function test was within normal ranges. The patient's spouse reports that the patient experiences daytime tiredness, but the patient attributes this to her medication regimen. The patient also expresses concern about a previous episode of double pneumonia, which developed rapidly and was initially unnoticed.   Assessment and Plan:    Sleep Apnea Mild sleep apnea with AHI of 11-12 events/hour. Increased from 2015 study. Daytime somnolence noted. CPAP not tolerated. Considering oral appliance. - Recheck overnight oxygen levels with pulse oximeter. - If oxygen levels low, consider referral for fitted oral appliance. - Discuss costs and insurance for oral appliance.  Nocturnal Hypoxemia Previous oximetry showed oxygen levels <89% for over two hours. Recent study showed levels at or below 88% for 18 minutes. Using nocturnal oxygen therapy, concentrator malfunctioning. - Order service for oxygen concentrator. - Recheck overnight oxygen levels with pulse oximeter.  Oxygen Concentrator Malfunction Concentrator making noise and delayed function, indicating malfunction. Used only at night. - Order service for oxygen concentrator.  Follow-up Interested in reassessing nocturnal oxygen levels and exploring alternative sleep apnea treatments. Option for pulse oximeter with alarm and Smart Altamese for snoring. - Contact office if oximetry results not received in two weeks. - Consider alternative home care companies if current provider unsatisfactory. - Provide information on Smart Altamese for snoring.     04/16/24- 78 yoF for sleep evaluation with hx Nocturnal Hypoxemia. Saw Dr Darlean after  Covid pneumonia, Respiratory Failure August, 2024. Did not desat on walk test. NPSG 11/05/13- AHI/ 2.4/hr, desat to 85%transiently, body weight 253 lbs Medical problem list  includes HTN, DM2, Hypothyroid, Polyneuropathy, Morbid Obesity, Chronic Cough,  ONO on Aug 28 2023  desat x 2 h 8 min @ < 89%  >  09/08/2023   rec 2lpm and repeat on 2lpm performed  on 16th Dec [employer] - remained 90% and above all night. HST 12/07/23- AHI 11.5/hr, desat to 84%, time </= 88% was 13 minutes on room air O2 ordered [employer] 09/04/23- 2L sleep and POC PFT 12/02/23- WNL Body weight today-236 lbs Overnight Oximetry on room air 02/10/24-  </= 88% for 48 minutes In March we ordered [facility] [employer] to service noisy O2 concentrator>> status? Dr Darlean saw in f/u for Hypoxemia, [place] in May>> should turn care over to him. <<<<<<<<< New concentrator is quieter, but she still wants to sleep without O2. Discussed the use of AI scribe software for clinical note transcription with the patient, who gave verbal consent to proceed.  History of Present Illness   Grace Delacruz is a 78 year old female with respiratory issues who presents with concerns about discontinuing home oxygen therapy. She was referred by her family doctor for persistent cough and respiratory issues.  She removes her oxygen at 5 AM and sleeps without it for a couple of hours, feeling stable during this period. She is cautious about discontinuing oxygen therapy abruptly. Her last overnight oximetry on April 22 showed oxygen saturation at or below 88% for 48 minutes. She is interested in a pulse oximeter with an alarm for nighttime monitoring. She recalls a previous episode with oxygen levels dropping to 75%, which was alarming. Since recovering from a recent illness, she feels improved but not fully recovered. She uses a new, quieter oxygen concentrator and sleeps with the head of the bed elevated to aid breathing.     Assessment and Plan:   Chronic Respiratory Failure with Hypoxemia Chronic respiratory failure Chronic respiratory failure with improved symptoms. Last oximetry showed desaturation for 48 minutes. No cardiac or stroke  concerns. - Trial off oxygen therapy if comfortable, resume if symptoms worsen. - Recommend purchasing pulse oximeter with alarm for night monitoring. - Advise elevating head of bed to aid breathing. - Instruct to inform cardiologist about intermittent oxygen use. - Schedule follow-up in four months to reassess oxygen needs.   Obesity- suspect nocturnal problem is Obesity Hypoventilation -encourage weight loss    ROS-see HPI   + = positive Constitutional:    weight loss, night sweats, fevers, chills, fatigue, lassitude. HEENT:    headaches, difficulty swallowing, tooth/dental problems, sore throat,       sneezing, itching, ear ache, nasal congestion, post nasal drip, snoring CV:    chest pain, orthopnea, PND, swelling in lower extremities, anasarca,                                   dizziness, palpitations Resp:   shortness of breath with exertion or at rest.                productive cough,   non-productive cough, coughing up of blood.              change in color of mucus.  wheezing.   Skin:    rash or lesions. GI:  No-   heartburn, indigestion, abdominal pain, nausea, vomiting,  diarrhea,                 change in bowel habits, loss of appetite GU: dysuria, change in color of urine, no urgency or frequency.   flank pain. MS:   joint pain, stiffness, decreased range of motion, back pain. Neuro-     nothing unusual Psych:  change in mood or affect.  depression or anxiety.   memory loss.  OBJ- Physical Exam General- Alert, Oriented, Affect-appropriate, Distress- none acute,+ Obese Skin- rash-none, lesions- none, excoriation- none Lymphadenopathy- none Head- atraumatic            Eyes- Gross vision intact, PERRLA, conjunctivae and secretions clear            Ears- Hearing, canals-normal            Nose- Clear, no-Septal dev, mucus, polyps, erosion, perforation             Throat- Mallampati III-IV , mucosa clear , drainage- none, tonsils- absent, +teeth Neck- flexible , trachea  midline, no stridor , thyroid  nl, carotid no bruit Chest - symmetrical excursion , unlabored           Heart/CV- RRR , no murmur , no gallop  , no rub, nl s1 s2                           - JVD- none , edema- none, stasis changes- none, varices- none           Lung- +few crackles, wheeze-none, cough- none , dullness-none, rub- none           Chest wall-  Abd-  Br/ Gen/ Rectal- Not done, not indicated Extrem- cyanosis- none, clubbing, none, atrophy- none, strength- nl Neuro- grossly intact to observation

## 2024-04-16 ENCOUNTER — Encounter: Payer: Self-pay | Admitting: Internal Medicine

## 2024-04-16 ENCOUNTER — Ambulatory Visit: Admitting: Internal Medicine

## 2024-04-16 VITALS — BP 140/80 | HR 75 | Temp 97.5°F | Ht 65.0 in | Wt 236.0 lb

## 2024-04-16 DIAGNOSIS — G4733 Obstructive sleep apnea (adult) (pediatric): Secondary | ICD-10-CM

## 2024-04-16 DIAGNOSIS — G4734 Idiopathic sleep related nonobstructive alveolar hypoventilation: Secondary | ICD-10-CM

## 2024-04-16 NOTE — Patient Instructions (Signed)
 As discussed- your oxygen levels do drop some at night. Be quick to put your oxygen back on if you don't feel well.

## 2024-04-19 DIAGNOSIS — E1165 Type 2 diabetes mellitus with hyperglycemia: Secondary | ICD-10-CM | POA: Diagnosis not present

## 2024-04-19 DIAGNOSIS — K219 Gastro-esophageal reflux disease without esophagitis: Secondary | ICD-10-CM | POA: Diagnosis not present

## 2024-04-19 DIAGNOSIS — H43391 Other vitreous opacities, right eye: Secondary | ICD-10-CM | POA: Diagnosis not present

## 2024-04-19 DIAGNOSIS — E039 Hypothyroidism, unspecified: Secondary | ICD-10-CM | POA: Diagnosis not present

## 2024-04-29 ENCOUNTER — Other Ambulatory Visit: Payer: Self-pay | Admitting: Medical Genetics

## 2024-05-12 ENCOUNTER — Ambulatory Visit: Admitting: Cardiology

## 2024-05-12 ENCOUNTER — Encounter: Payer: Self-pay | Admitting: Internal Medicine

## 2024-05-13 ENCOUNTER — Other Ambulatory Visit: Payer: Self-pay | Admitting: Nurse Practitioner

## 2024-05-13 ENCOUNTER — Other Ambulatory Visit (HOSPITAL_COMMUNITY)
Admission: RE | Admit: 2024-05-13 | Discharge: 2024-05-13 | Disposition: A | Discharge status: Home / Self Care | Payer: Self-pay | Source: Ambulatory Visit | Attending: Oncology | Admitting: Oncology

## 2024-05-20 DIAGNOSIS — K219 Gastro-esophageal reflux disease without esophagitis: Secondary | ICD-10-CM | POA: Diagnosis not present

## 2024-05-20 DIAGNOSIS — E039 Hypothyroidism, unspecified: Secondary | ICD-10-CM | POA: Diagnosis not present

## 2024-05-20 DIAGNOSIS — E1165 Type 2 diabetes mellitus with hyperglycemia: Secondary | ICD-10-CM | POA: Diagnosis not present

## 2024-05-23 LAB — GENECONNECT MOLECULAR SCREEN: Genetic Analysis Overall Interpretation: NEGATIVE

## 2024-05-31 ENCOUNTER — Encounter (HOSPITAL_COMMUNITY): Payer: Self-pay | Admitting: Internal Medicine

## 2024-05-31 ENCOUNTER — Other Ambulatory Visit: Payer: Self-pay

## 2024-06-01 ENCOUNTER — Telehealth: Payer: Self-pay

## 2024-06-01 NOTE — Telephone Encounter (Signed)
 Procedure:COLON Procedure date: 06/08/24 Procedure location: [facility] Arrival Time: 11:30 Spoke with the patient Y/N: Y Any prep concerns? N  Has the patient obtained the prep from the pharmacy ? Y Do you have a care partner and transportation: Y Any additional concerns? N

## 2024-06-08 ENCOUNTER — Ambulatory Visit (HOSPITAL_COMMUNITY)
Admission: RE | Admit: 2024-06-08 | Discharge: 2024-06-08 | Disposition: A | Discharge status: Home / Self Care | Attending: Internal Medicine | Admitting: Internal Medicine

## 2024-06-08 ENCOUNTER — Encounter (HOSPITAL_COMMUNITY): Payer: Self-pay | Admitting: Internal Medicine

## 2024-06-08 ENCOUNTER — Ambulatory Visit (HOSPITAL_COMMUNITY): Admitting: Anesthesiology

## 2024-06-08 ENCOUNTER — Other Ambulatory Visit: Payer: Self-pay

## 2024-06-08 ENCOUNTER — Encounter (HOSPITAL_COMMUNITY)
Admission: RE | Disposition: A | Discharge status: Home / Self Care | Payer: Self-pay | Source: Home / Self Care | Attending: Internal Medicine

## 2024-06-08 ENCOUNTER — Ambulatory Visit (HOSPITAL_BASED_OUTPATIENT_CLINIC_OR_DEPARTMENT_OTHER): Admitting: Anesthesiology

## 2024-06-08 DIAGNOSIS — Z9981 Dependence on supplemental oxygen: Secondary | ICD-10-CM | POA: Diagnosis not present

## 2024-06-08 DIAGNOSIS — E119 Type 2 diabetes mellitus without complications: Secondary | ICD-10-CM | POA: Insufficient documentation

## 2024-06-08 DIAGNOSIS — K529 Noninfective gastroenteritis and colitis, unspecified: Secondary | ICD-10-CM | POA: Insufficient documentation

## 2024-06-08 DIAGNOSIS — I110 Hypertensive heart disease with heart failure: Secondary | ICD-10-CM | POA: Diagnosis not present

## 2024-06-08 DIAGNOSIS — K5730 Diverticulosis of large intestine without perforation or abscess without bleeding: Secondary | ICD-10-CM | POA: Insufficient documentation

## 2024-06-08 DIAGNOSIS — D120 Benign neoplasm of cecum: Secondary | ICD-10-CM

## 2024-06-08 DIAGNOSIS — Z79899 Other long term (current) drug therapy: Secondary | ICD-10-CM | POA: Diagnosis not present

## 2024-06-08 DIAGNOSIS — F32A Depression, unspecified: Secondary | ICD-10-CM | POA: Insufficient documentation

## 2024-06-08 DIAGNOSIS — F419 Anxiety disorder, unspecified: Secondary | ICD-10-CM | POA: Diagnosis not present

## 2024-06-08 DIAGNOSIS — Z7985 Long-term (current) use of injectable non-insulin antidiabetic drugs: Secondary | ICD-10-CM | POA: Diagnosis not present

## 2024-06-08 DIAGNOSIS — E785 Hyperlipidemia, unspecified: Secondary | ICD-10-CM | POA: Insufficient documentation

## 2024-06-08 DIAGNOSIS — K219 Gastro-esophageal reflux disease without esophagitis: Secondary | ICD-10-CM | POA: Diagnosis not present

## 2024-06-08 DIAGNOSIS — D122 Benign neoplasm of ascending colon: Secondary | ICD-10-CM | POA: Diagnosis not present

## 2024-06-08 DIAGNOSIS — Z139 Encounter for screening, unspecified: Secondary | ICD-10-CM | POA: Diagnosis not present

## 2024-06-08 DIAGNOSIS — Z6841 Body Mass Index (BMI) 40.0 and over, adult: Secondary | ICD-10-CM | POA: Insufficient documentation

## 2024-06-08 DIAGNOSIS — I509 Heart failure, unspecified: Secondary | ICD-10-CM | POA: Insufficient documentation

## 2024-06-08 DIAGNOSIS — Z1211 Encounter for screening for malignant neoplasm of colon: Secondary | ICD-10-CM | POA: Insufficient documentation

## 2024-06-08 DIAGNOSIS — E66813 Obesity, class 3: Secondary | ICD-10-CM | POA: Diagnosis not present

## 2024-06-08 DIAGNOSIS — I10 Essential (primary) hypertension: Secondary | ICD-10-CM

## 2024-06-08 DIAGNOSIS — E039 Hypothyroidism, unspecified: Secondary | ICD-10-CM | POA: Diagnosis not present

## 2024-06-08 DIAGNOSIS — K648 Other hemorrhoids: Secondary | ICD-10-CM

## 2024-06-08 HISTORY — DX: Dyspnea, unspecified: R06.00

## 2024-06-08 HISTORY — PX: COLONOSCOPY: SHX5424

## 2024-06-08 HISTORY — DX: Heart failure, unspecified: I50.9

## 2024-06-08 HISTORY — DX: Malignant (primary) neoplasm, unspecified: C80.1

## 2024-06-08 HISTORY — DX: Pneumonia, unspecified organism: J18.9

## 2024-06-08 LAB — GLUCOSE, CAPILLARY: Glucose-Capillary: 119 mg/dL — ABNORMAL HIGH (ref 70–99)

## 2024-06-08 SURGERY — COLONOSCOPY
Anesthesia: Monitor Anesthesia Care

## 2024-06-08 MED ORDER — SODIUM CHLORIDE 0.9 % IV SOLN: INTRAVENOUS | Status: DC

## 2024-06-08 MED ORDER — PROPOFOL 10 MG/ML IV BOLUS
INTRAVENOUS | Status: DC | PRN
  Administered 2024-06-08 (×2): 10 mg via INTRAVENOUS
  Administered 2024-06-08 (×2): 20 mg via INTRAVENOUS

## 2024-06-08 MED ORDER — DEXMEDETOMIDINE HCL IN NACL 200 MCG/50ML IV SOLN
INTRAVENOUS | Status: DC | PRN
  Administered 2024-06-08: 8 ug via INTRAVENOUS

## 2024-06-08 MED ORDER — PROPOFOL 1000 MG/100ML IV EMUL
INTRAVENOUS | Status: AC
  Filled 2024-06-08: qty 100

## 2024-06-08 MED ORDER — LIDOCAINE HCL 1 % IJ SOLN
INTRAMUSCULAR | Status: DC | PRN
  Administered 2024-06-08: 40 mg via INTRADERMAL

## 2024-06-08 MED ORDER — PROPOFOL 500 MG/50ML IV EMUL
INTRAVENOUS | Status: DC | PRN
Start: 2024-06-08 — End: 2024-06-08
  Administered 2024-06-08: 140 ug/kg/min via INTRAVENOUS

## 2024-06-08 NOTE — H&P (Signed)
 GASTROENTEROLOGY PROCEDURE H&P NOTE   Primary Care Physician: Trudy Vaughn FALCON, MD    Reason for Procedure:   Colon cancer screening  Plan:    colonoscopy  Patient is appropriate for endoscopic procedure(s) in the outpt hospital setting.  The nature of the procedure, as well as the risks, benefits, and alternatives were carefully and thoroughly reviewed with the patient. Ample time for discussion and questions allowed. The patient understood, was satisfied, and agreed to proceed.     HPI: Grace Delacruz is a 78 y.o. female who presents for colonoscopy.  Medical history as below.  Tolerated the prep.  No recent chest pain or shortness of breath.  No abdominal pain today.  Past Medical History:  Diagnosis Date   Anxiety    Cancer (HCC)    skin face   CHF (congestive heart failure) (HCC)    Depression    DJD (degenerative joint disease)    Dyspnea    Esophageal reflux    Essential hypertension    Fibrocystic breast disease    Hypothyroidism    Left bundle branch block    Transient - possibly rate related    Menopause    Osteoarthritis    Pneumonia    PONV (postoperative nausea and vomiting)    pt. would like to have a scopolamine  patch   Restless leg syndrome    Scoliosis    Type 2 diabetes mellitus (HCC)     Past Surgical History:  Procedure Laterality Date   BREAST BIOPSY     lt x3   CARPOMETACARPEL SUSPENSION PLASTY Left 03/08/2014   Procedure: LEFT THUMB CARPOMETACARPEL (CMC) SUSPENSION PLASTY WITH TRAPEZIUM EXCISION;  Surgeon: Lamar LULLA Leonor Mickey., MD;  Location: [facility];  Service: Orthopedics;  Laterality: Left;   CATARACT EXTRACTION Bilateral    CESAREAN SECTION     CHOLECYSTECTOMY     COLONOSCOPY  03/26/2007   RMR:  Diffuse changes of proctocolitis as described above diminutive rectal polyp, cold biopsy/removed, pedunculated polyp of the splenic flexure hot snare removed.  Segmental biopsies the colon taken of the ileocecal valve  ulcer biopsied normal terminal ileum   COLONOSCOPY N/A 01/05/2014   Procedure: COLONOSCOPY;  Surgeon: Claudis RAYMOND Rivet, MD;  [facility]: [facility];  Service: Endoscopy;  Laterality: N/A;  830   EYE SURGERY     GASTROCNEMIUS RECESSION Right 08/07/2016   Procedure: GASTROCNEMIUS RECESSION;  Surgeon: Jerona LULLA Sage, MD;  Location: [facility] OR;  Service: Orthopedics;  Laterality: Right;   Joint fusion-foot     x2 LEFT FOOT   KNEE ARTHROPLASTY  2001   Right   KNEE ARTHROPLASTY  2002   Left   LEFT HEART CATH AND CORONARY ANGIOGRAPHY N/A 06/18/2018   Procedure: LEFT HEART CATH AND CORONARY ANGIOGRAPHY;  Surgeon: Verlin Lonni BIRCH, MD;  Location: [facility];  Service: Cardiovascular;  Laterality: N/A;   Left leg vein resection     RETINAL DETACHMENT SURGERY Left 02/25/2022   done in [facility]   ROTATOR CUFF REPAIR     LEFT   THYROIDECTOMY  1980   3/4   TONSILLECTOMY     TUBAL LIGATION     WEIL OSTEOTOMY Right 08/07/2016   Procedure: WEIL OSTEOTOMY 2nd and 3rd Metatarsal Right Foot;  Surgeon: Jerona LULLA Sage, MD;  Location: [facility] OR;  Service: Orthopedics;  Laterality: Right;   WEIL OSTEOTOMY Left 12/25/2016   Procedure: 2nd Toe Proximal Interphalangeal Resection, Weil Osteotomy 2nd and 3rd Metatarsal Left Foot;  Surgeon: Jerona Harden GAILS, MD;  [facility];  Service: Orthopedics;  Laterality: Left;    Prior to Admission medications   Medication Sig Start Date End Date Taking? Authorizing Provider  aspirin  EC 81 MG tablet Take 81 mg by mouth at bedtime.    Yes [provider]  Coenzyme Q10 (COQ-10) 100 MG CAPS Take 1 capsule by mouth daily.   Yes [provider]  docusate sodium (COLACE) 100 MG capsule Take 200 mg by mouth at bedtime.   Yes [provider]  famotidine  (PEPCID ) 20 MG tablet One after supper 10/21/23  Yes Darlean Ozell NOVAK, MD  FLUoxetine (PROZAC) 20 MG capsule Take 20 mg by mouth every morning.    Yes [provider]  furosemide (LASIX) 40 MG  tablet Take 40 mg by mouth daily.   Yes [provider]  gabapentin  (NEURONTIN ) 100 MG capsule Take 100 mg by mouth 2 (two) times daily. 08/21/23  Yes [provider]  gabapentin  (NEURONTIN ) 100 MG capsule Take 1 capsule (100 mg total) by mouth 4 (four) times daily. 03/17/24  Yes Darlean Ozell NOVAK, MD  levothyroxine  (SYNTHROID ) 88 MCG tablet TAKE 1 TABLET BY MOUTH DAILY 05/14/24  Yes Therisa Benton PARAS, NP  loratadine (CLARITIN) 10 MG tablet Take 10 mg by mouth daily.   Yes [provider]  losartan  (COZAAR ) 100 MG tablet Take 1 tablet (100 mg total) by mouth every evening. 08/24/13  Yes Debera Jayson MATSU, MD  metoprolol succinate (TOPROL-XL) 100 MG 24 hr tablet Take 100 mg by mouth every morning. Take with or immediately following a meal.   Yes [provider]  Multiple Vitamin (MULTIVITAMIN) tablet Take 1 tablet by mouth daily.   Yes [provider]  OXYGEN Inhale 2 L into the lungs at bedtime.   Yes [provider]  pantoprazole  (PROTONIX ) 40 MG tablet Take 40 mg by mouth daily. 02/23/24  Yes [provider]  potassium chloride (KLOR-CON) 10 MEQ tablet Take 10 mEq by mouth daily.   Yes [provider]  RABEprazole  (ACIPHEX ) 20 MG tablet Take 1 tablet (20 mg total) by mouth daily. 01/29/24  Yes Ajay Strubel, Gordy HERO, MD  simvastatin (ZOCOR) 80 MG tablet Take 1 tablet by mouth at bedtime. 01/22/22  Yes [provider]  vitamin C (ASCORBIC ACID) 500 MG tablet Take 500 mg by mouth daily.   Yes [provider]  Vitamin D, Cholecalciferol, 1000 UNITS CAPS Take 1,000 mg by mouth 2 (two) times daily.   Yes [provider]  albuterol (VENTOLIN HFA) 108 (90 Base) MCG/ACT inhaler Inhale 2 puffs into the lungs every 4 (four) hours as needed. 03/10/24   [provider]  meclizine (ANTIVERT) 25 MG tablet Take 12.5 mg by mouth at bedtime as needed.    [provider]  OZEMPIC, 0.25 OR 0.5 MG/DOSE, 2 MG/1.5ML SOPN  Inject 1 mL into the skin once a week. 03/02/20   [provider]  triamcinolone  cream (KENALOG ) 0.5 % Apply 1 application topically 3 (three) times daily as needed. For rash/irritation. 06/21/16   [provider]    Current Facility-Administered Medications  Medication Dose Route Frequency Provider Last Rate Last Admin   0.9 %  sodium chloride  infusion   Intravenous Continuous Demaris Bousquet, Gordy HERO, MD        Allergies as of 03/31/2024 - Review Complete 03/25/2024  Allergen Reaction Noted   Ephedrine  Palpitations 03/03/2014   Hibiclens [chlorhexidine  gluconate] Rash 06/29/2012   Keflex [cephalexin] Itching  and Rash 06/29/2012    Family History  Problem Relation Age of Onset   Hypertension Mother    Heart failure Father        Died with pneumonia   Hypertension Father    Arthritis Father    Diabetes Father    COPD Brother    Asthma Paternal Grandfather    Emphysema Paternal Grandfather    Colon cancer Neg Hx    Esophageal cancer Neg Hx    Stomach cancer Neg Hx    Colon polyps Neg Hx    Pancreatic cancer Neg Hx     Social History   Socioeconomic History   Marital status: Married    Spouse name: Not on file   Number of children: 1   Years of education: Not on file   Highest education level: Not on file  Occupational History   Occupation: retired  Tobacco Use   Smoking status: Never   Smokeless tobacco: Never  Vaping Use   Vaping status: Never Used  Substance and Sexual Activity   Alcohol  use: Yes    Alcohol /week: 0.0 standard drinks of alcohol     Comment: rare-- glass of wine at [employer]   Drug use: No   Sexual activity: Not on file  Other Topics Concern   Not on file  Social History Narrative   Not on file   Social Drivers of Health   Financial Resource Strain: Low Risk  (06/10/2023)   Received from [facility]   Overall Financial Resource Strain (CARDIA)    Difficulty of Paying Living Expenses: Not hard at all  Food Insecurity: No Food  Insecurity (06/10/2023)   Received from [facility]   Hunger Vital Sign    Within the past 12 months, you worried that your food would run out before you got the money to buy more.: Never true    Within the past 12 months, the food you bought just didn't last and you didn't have money to get more.: Never true  Transportation Needs: No Transportation Needs (06/10/2023)   Received from [facility]    Lack of Transportation (Medical): No    Lack of Transportation (Non-Medical): No  Physical Activity: Not on file  Stress: Not on file  Social Connections: Not on file  Intimate Partner Violence: Not At Risk (06/10/2023)   Received from [facility]   Humiliation, Afraid, Rape, and Kick questionnaire    Within the last year, have you been afraid of your partner or ex-partner?: No    Within the last year, have you been humiliated or emotionally abused in other ways by your partner or ex-partner?: No    Within the last year, have you been kicked, hit, slapped, or otherwise physically hurt by your partner or ex-partner?: No    Within the last year, have you been raped or forced to have any kind of sexual activity by your partner or ex-partner?: No    Physical Exam: Vital signs in last 24 hours: @BP  122/63   Pulse 77   Temp (!) 96.8 F (36 C) (Temporal)   Resp 14   Ht 5' 4 (1.626 m)   Wt 103.9 kg   SpO2 97%   BMI 39.31 kg/m  GEN: NAD EYE: Sclerae anicteric ENT: MMM CV: Non-tachycardic Pulm: CTA b/l GI: Soft, NT/ND NEURO:  Alert & Oriented x 3   Gordy Starch, MD [facility]  06/08/2024 1:10 PM

## 2024-06-08 NOTE — Transfer of Care (Signed)
 Immediate Anesthesia Transfer of Care Note  Patient: Grace Delacruz  Procedure(s) Performed: COLONOSCOPY  Patient Location: PACU and Endoscopy Unit  Anesthesia Type:MAC  Level of Consciousness: oriented, drowsy, and patient cooperative  Airway & Oxygen Therapy: Patient Spontanous Breathing and Patient connected to face mask oxygen  Post-op Assessment: Report given to RN and Post -op Vital signs reviewed and stable  Post vital signs: Reviewed and stable  Last Vitals:  Vitals Value Taken Time  BP    Temp    Pulse 65 06/08/24 14:46  Resp 17 06/08/24 14:46  SpO2 100 % 06/08/24 14:46  Vitals shown include unfiled device data.  Last Pain:  Vitals:   06/08/24 1219  TempSrc: Temporal  PainSc: 0-No pain         Complications: No notable events documented.

## 2024-06-08 NOTE — Anesthesia Postprocedure Evaluation (Signed)
 Anesthesia Post Note  Patient: Grace Delacruz  Procedure(s) Performed: COLONOSCOPY     Patient location during evaluation: PACU Anesthesia Type: MAC Level of consciousness: awake and alert and oriented Pain management: pain level controlled Vital Signs Assessment: post-procedure vital signs reviewed and stable Respiratory status: spontaneous breathing, nonlabored ventilation and respiratory function stable Cardiovascular status: stable and blood pressure returned to baseline Postop Assessment: no apparent nausea or vomiting Anesthetic complications: no   No notable events documented.  Last Vitals:  Vitals:   06/08/24 1500 06/08/24 1510  BP: 125/61 (!) 141/65  Pulse: 69 71  Resp: 20 14  Temp:    SpO2: 98% 97%    Last Pain:  Vitals:   06/08/24 1446  TempSrc: Temporal  PainSc:                  Kenzi Bardwell A.

## 2024-06-08 NOTE — Discharge Instructions (Signed)
 YOU HAD AN ENDOSCOPIC PROCEDURE TODAY: Refer to the procedure report and other information in the discharge instructions given to you for any specific questions about what was found during the examination. If this information does not answer your questions, please call [place] office at [phone] to clarify.   YOU SHOULD EXPECT: Some feelings of bloating in the abdomen. Passage of more gas than usual. Walking can help get rid of the air that was put into your GI tract during the procedure and reduce the bloating. If you had a lower endoscopy (such as a colonoscopy or flexible sigmoidoscopy) you [place] notice spotting of blood in your stool or on the toilet paper. Some abdominal soreness [place] be present for a day or two, also.  DIET: Your first meal following the procedure should be a light meal and then it is ok to progress to your normal diet. A half-sandwich or bowl of soup is an example of a good first meal. Heavy or fried foods are harder to digest and [place] make you feel nauseous or bloated. Drink plenty of fluids but you should avoid alcoholic beverages for 24 hours. If you had a esophageal dilation, please see attached instructions for diet.    ACTIVITY: Your care partner should take you home directly after the procedure. You should plan to take it easy, moving slowly for the rest of the day. You can resume normal activity the day after the procedure however YOU SHOULD NOT DRIVE, use power tools, machinery or perform tasks that involve climbing or major physical exertion for 24 hours (because of the sedation medicines used during the test).   SYMPTOMS TO REPORT IMMEDIATELY: A gastroenterologist can be reached at any hour. Please call [phone]  for any of the following symptoms:  Following lower endoscopy (colonoscopy, flexible sigmoidoscopy) Excessive amounts of blood in the stool  Significant tenderness, worsening of abdominal pains  Swelling of the abdomen that is new, acute  Fever of 100 or  higher   FOLLOW UP:  If any biopsies were taken you will be contacted by phone or by letter within the next 1-3 weeks. Call [phone]  if you have not heard about the biopsies in 3 weeks.  Please also call with any specific questions about appointments or follow up tests.

## 2024-06-08 NOTE — Op Note (Signed)
 [facility] Patient Name: Grace Delacruz Procedure Date: 06/08/2024 MRN: [id number] Attending MD: Gordy CHRISTELLA Starch , MD, [id number] Date of Birth: December 24, 1945 CSN: [id number] Age: 78 Admit Type: Inpatient Procedure:                Colonoscopy Indications:              Screening for colorectal malignant neoplasm, Last                            colonoscopy: 2015; remote history of colitis Providers:                Gordy CHRISTELLA. Starch, MD, Darleene Bare, RN, Coye Bade, Technician Referring MD:             Vaughn FALCON. Williams Medicines:                Monitored Anesthesia Care Complications:            No immediate complications. Estimated Blood Loss:     Estimated blood loss was minimal. Procedure:                Pre-Anesthesia Assessment:                           - Prior to the procedure, a History and Physical                            was performed, and patient medications and                            allergies were reviewed. The patient's tolerance of                            previous anesthesia was also reviewed. The risks                            and benefits of the procedure and the sedation                            options and risks were discussed with the patient.                            All questions were answered, and informed consent                            was obtained. Prior Anticoagulants: The patient has                            taken no anticoagulant or antiplatelet agents. ASA                            Grade Assessment: III - A patient with severe  systemic disease. After reviewing the risks and                            benefits, the patient was deemed in satisfactory                            condition to undergo the procedure.                           After obtaining informed consent, the colonoscope                            was passed under direct vision. Throughout the                             procedure, the patient's blood pressure, pulse, and                            oxygen saturations were monitored continuously. The                            CF-HQ190L ([id number] Olympus colonoscope was                            introduced through the anus and advanced to the                            cecum, identified by appendiceal orifice and                            ileocecal valve. The colonoscopy was technically                            difficult and complex due to multiple diverticula                            in the colon. Successful completion of the                            procedure was aided by withdrawing the scope and                            replacing with the pediatric colonoscope. The                            patient tolerated the procedure well. The quality                            of the bowel preparation was good. The ileocecal                            valve, appendiceal orifice, and rectum were  photographed. Scope In: 2:10:39 PM Scope Out: 2:37:51 PM Scope Withdrawal Time: 0 hours 11 minutes 4 seconds  Total Procedure Duration: 0 hours 27 minutes 12 seconds  Findings:      The digital rectal exam was normal.      Two sessile polyps were found in the cecum. The polyps were 2 to 3 mm in       size. These polyps were removed with a saline injection-lift technique       using a hot snare. Resection and retrieval were complete.      An 8 mm polyp was found in the ascending colon. The polyp was sessile.       The polyp was removed with a cold snare. Resection and retrieval were       complete.      Segmental mild inflammation characterized by altered vascularity,       erosions, granularity and shallow ulcerations was found in the proximal       sigmoid colon and in the descending colon. Biopsies were taken with a       cold forceps for histology.      Multiple medium-mouthed and small-mouthed diverticula were  found in the       sigmoid colon.      Internal hemorrhoids were found during retroflexion. The hemorrhoids       were small. Impression:               - Two 2 to 3 mm polyps in the cecum, removed using                            injection-lift and a hot snare. Resected and                            retrieved.                           - One 8 mm polyp in the ascending colon, removed                            with a cold snare. Resected and retrieved.                           - Segmental mild inflammation was found in the                            proximal sigmoid colon and in the descending colon,                            rule out inflammatory bowel disease. The                            inflammation was not present in the colonic segment                            affected by diverticulosis. Biopsied.                           - Severe diverticulosis in the sigmoid colon. No  inflammation in this segment.                           - Small internal hemorrhoids. Moderate Sedation:      N/A Recommendation:           - Patient has a contact number available for                            emergencies. The signs and symptoms of potential                            delayed complications were discussed with the                            patient. Return to normal activities tomorrow.                            Written discharge instructions were provided to the                            patient.                           - Resume previous diet.                           - Continue present medications.                           - Await pathology results.                           - No recommendation at this time regarding repeat                            colonoscopy due to age at next surveillance                            interval. Procedure Code(s):        --- Professional ---                           [phone], Colonoscopy, flexible; with removal of                             tumor(s), polyp(s), or other lesion(s) by snare                            technique                           45380, 59, Colonoscopy, flexible; with biopsy,                            single or multiple  54618, Colonoscopy, flexible; with directed                            submucosal injection(s), any substance Diagnosis Code(s):        --- Professional ---                           Z12.11, Encounter for screening for malignant                            neoplasm of colon                           D12.0, Benign neoplasm of cecum                           D12.2, Benign neoplasm of ascending colon                           K52.9, Noninfective gastroenteritis and colitis,                            unspecified                           K64.8, Other hemorrhoids                           K57.30, Diverticulosis of large intestine without                            perforation or abscess without bleeding CPT copyright 2022 American Medical Association. All rights reserved. The codes documented in this report are preliminary and upon coder review may  be revised to meet current compliance requirements. Gordy CHRISTELLA Starch, MD 06/08/2024 3:07:49 PM This report has been signed electronically. Number of Addenda: 0

## 2024-06-08 NOTE — Anesthesia Preprocedure Evaluation (Addendum)
 Anesthesia Evaluation  Patient identified by MRN, date of birth, ID band Patient awake    Reviewed: Allergy & Precautions, NPO status , Patient's Chart, lab work & pertinent test results, reviewed documented beta blocker date and time   History of Anesthesia Complications (+) PONV and history of anesthetic complications  Airway Mallampati: III  TM Distance: >3 FB     Dental  (+) Caps, Dental Advisory Given   Pulmonary shortness of breath, with exertion, at rest and Long-Term Oxygen Therapy, pneumonia, resolved Uses Home O2 only at night   Pulmonary exam normal breath sounds clear to auscultation       Cardiovascular hypertension, Pt. on medications and Pt. on home beta blockers +CHF and + DOE  Normal cardiovascular exam+ dysrhythmias  Rhythm:Regular Rate:Normal     Neuro/Psych  Headaches PSYCHIATRIC DISORDERS Anxiety Depression     Neuromuscular disease    GI/Hepatic Neg liver ROS,GERD  Medicated,,Screening colonoscopy   Endo/Other  diabetes, Well Controlled, Type 2, Oral Hypoglycemic AgentsHypothyroidism  Class 3 obesityGLP-1 RA therapy- last dose 8 days ago Fibrocystic breast disease HLD  Renal/GU negative Renal ROS  negative genitourinary   Musculoskeletal  (+) Arthritis , Osteoarthritis,    Abdominal  (+) + obese  Peds  Hematology negative hematology ROS (+)   Anesthesia Other Findings   Reproductive/Obstetrics                              Anesthesia Physical Anesthesia Plan  ASA: 3  Anesthesia Plan: MAC   Post-op Pain Management: Minimal or no pain anticipated   Induction: Intravenous  PONV Risk Score and Plan: Treatment may vary due to age or medical condition and Propofol  infusion  Airway Management Planned: Natural Airway, Nasal Cannula and Simple Face Mask  Additional Equipment: None  Intra-op Plan:   Post-operative Plan:   Informed Consent: I have reviewed the  patients History and Physical, chart, labs and discussed the procedure including the risks, benefits and alternatives for the proposed anesthesia with the patient or authorized representative who has indicated his/her understanding and acceptance.     Dental advisory given  Plan Discussed with: CRNA and Anesthesiologist  Anesthesia Plan Comments:          Anesthesia Quick Evaluation

## 2024-06-10 ENCOUNTER — Ambulatory Visit: Payer: Self-pay | Admitting: Internal Medicine

## 2024-06-10 ENCOUNTER — Encounter (HOSPITAL_COMMUNITY): Payer: Self-pay | Admitting: Internal Medicine

## 2024-06-10 LAB — SURGICAL PATHOLOGY

## 2024-06-11 MED ORDER — MESALAMINE 1.2 G PO TBEC: 2.4000 g | DELAYED_RELEASE_TABLET | Freq: Every day | ORAL | 3 refills | Status: DC

## 2024-06-16 DIAGNOSIS — N390 Urinary tract infection, site not specified: Secondary | ICD-10-CM | POA: Diagnosis not present

## 2024-06-16 DIAGNOSIS — R300 Dysuria: Secondary | ICD-10-CM | POA: Diagnosis not present

## 2024-06-16 DIAGNOSIS — K529 Noninfective gastroenteritis and colitis, unspecified: Secondary | ICD-10-CM | POA: Diagnosis not present

## 2024-06-18 DIAGNOSIS — E1165 Type 2 diabetes mellitus with hyperglycemia: Secondary | ICD-10-CM | POA: Diagnosis not present

## 2024-06-18 DIAGNOSIS — K219 Gastro-esophageal reflux disease without esophagitis: Secondary | ICD-10-CM | POA: Diagnosis not present

## 2024-06-18 DIAGNOSIS — E039 Hypothyroidism, unspecified: Secondary | ICD-10-CM | POA: Diagnosis not present

## 2024-07-06 DIAGNOSIS — E1165 Type 2 diabetes mellitus with hyperglycemia: Secondary | ICD-10-CM | POA: Diagnosis not present

## 2024-07-06 DIAGNOSIS — R42 Dizziness and giddiness: Secondary | ICD-10-CM | POA: Diagnosis not present

## 2024-07-06 DIAGNOSIS — K760 Fatty (change of) liver, not elsewhere classified: Secondary | ICD-10-CM | POA: Diagnosis not present

## 2024-07-06 DIAGNOSIS — E876 Hypokalemia: Secondary | ICD-10-CM | POA: Diagnosis not present

## 2024-07-06 DIAGNOSIS — E559 Vitamin D deficiency, unspecified: Secondary | ICD-10-CM | POA: Diagnosis not present

## 2024-07-06 DIAGNOSIS — I10 Essential (primary) hypertension: Secondary | ICD-10-CM | POA: Diagnosis not present

## 2024-07-06 DIAGNOSIS — E039 Hypothyroidism, unspecified: Secondary | ICD-10-CM | POA: Diagnosis not present

## 2024-07-06 DIAGNOSIS — E785 Hyperlipidemia, unspecified: Secondary | ICD-10-CM | POA: Diagnosis not present

## 2024-07-06 DIAGNOSIS — D559 Anemia due to enzyme disorder, unspecified: Secondary | ICD-10-CM | POA: Diagnosis not present

## 2024-07-08 DIAGNOSIS — H524 Presbyopia: Secondary | ICD-10-CM | POA: Diagnosis not present

## 2024-07-13 DIAGNOSIS — Z0001 Encounter for general adult medical examination with abnormal findings: Secondary | ICD-10-CM | POA: Diagnosis not present

## 2024-07-13 DIAGNOSIS — K529 Noninfective gastroenteritis and colitis, unspecified: Secondary | ICD-10-CM | POA: Diagnosis not present

## 2024-07-13 DIAGNOSIS — Z1389 Encounter for screening for other disorder: Secondary | ICD-10-CM | POA: Diagnosis not present

## 2024-07-13 DIAGNOSIS — E1165 Type 2 diabetes mellitus with hyperglycemia: Secondary | ICD-10-CM | POA: Diagnosis not present

## 2024-07-13 DIAGNOSIS — K219 Gastro-esophageal reflux disease without esophagitis: Secondary | ICD-10-CM | POA: Diagnosis not present

## 2024-07-13 DIAGNOSIS — Z23 Encounter for immunization: Secondary | ICD-10-CM | POA: Diagnosis not present

## 2024-07-13 DIAGNOSIS — I10 Essential (primary) hypertension: Secondary | ICD-10-CM | POA: Diagnosis not present

## 2024-07-13 DIAGNOSIS — R300 Dysuria: Secondary | ICD-10-CM | POA: Diagnosis not present

## 2024-07-20 DIAGNOSIS — K219 Gastro-esophageal reflux disease without esophagitis: Secondary | ICD-10-CM | POA: Diagnosis not present

## 2024-07-20 DIAGNOSIS — E039 Hypothyroidism, unspecified: Secondary | ICD-10-CM | POA: Diagnosis not present

## 2024-07-20 DIAGNOSIS — E1165 Type 2 diabetes mellitus with hyperglycemia: Secondary | ICD-10-CM | POA: Diagnosis not present

## 2024-07-22 ENCOUNTER — Ambulatory Visit: Admitting: Neurology

## 2024-07-22 ENCOUNTER — Encounter: Payer: Self-pay | Admitting: Neurology

## 2024-07-22 VITALS — BP 132/79 | HR 71 | Ht 65.0 in | Wt 238.5 lb

## 2024-07-22 DIAGNOSIS — U099 Post covid-19 condition, unspecified: Secondary | ICD-10-CM | POA: Diagnosis not present

## 2024-07-22 DIAGNOSIS — R413 Other amnesia: Secondary | ICD-10-CM | POA: Diagnosis not present

## 2024-07-22 DIAGNOSIS — R208 Other disturbances of skin sensation: Secondary | ICD-10-CM

## 2024-07-22 DIAGNOSIS — H81399 Other peripheral vertigo, unspecified ear: Secondary | ICD-10-CM

## 2024-07-22 DIAGNOSIS — R300 Dysuria: Secondary | ICD-10-CM | POA: Diagnosis not present

## 2024-07-22 DIAGNOSIS — G629 Polyneuropathy, unspecified: Secondary | ICD-10-CM | POA: Diagnosis not present

## 2024-07-22 MED ORDER — DULOXETINE HCL 30 MG PO CPEP: 30.0000 mg | ORAL_CAPSULE | Freq: Every day | ORAL | 11 refills | Status: AC

## 2024-07-22 NOTE — Progress Notes (Signed)
 [facility]  PATIENT: Grace Delacruz DOB: 1946-06-26  REFERRING DOCTOR OR PCP: Vaughn Pouch, Franky Barrio SOURCE: Patient, notes from primary care, imaging and laboratory reports, MRI images personally reviewed.  _________________________________   HISTORICAL  CHIEF COMPLAINT:  Chief Complaint  Patient presents with   Follow-up    Pt in room 10. Husband in room. Here Polyneuropathy follow up.     HISTORY OF PRESENT ILLNESS:  Grace Delacruz is a 78 y.o. woman with  vertigo, headaches and gait disturbance and foot numbness  UPDATE 07/22/24 She is having a lot of UTI's    She also had a neck infection after an operation for basal cell carcinoma.     She is having numbness and pain in her fingertips and feet.  No proximal symptoms.   Sensation is is tingling > numbness.  Sometimes, the numbness is painful in her fingers and toes.    She has NIDDM (since 2007).  She notes the numbness more when she is resting.  At times it can become very painful and other times it seems milder   She is having some dizziness - lightheaded more than vertigo but balance seems off as she stands up.  Hearing is fine.  Dizziness most likely if looking up.    She had V[facility] at [facility] c/w benign paroxysmal positional vertigo.    Epley maneuvers have helped.      Since Covid in 2024, she has noted mild reduced memory. She had hypoxemia and continues to have some since that time.  She was in ICU x 8 days (not intubation).    She has urinary frequency and nocturia   Vascular risks:  She has well controlled essential hypertension.   Never smoked.   She has Type 2 NIDDM > 10 years.    Images personally reviewed.   MRI brain 06/03/2019 showed age-appropriate mild generalized cortical atrophy, scattered T2/FLAIR hyperintense foci consistent with mild chronic microvascular ischemic change and an arachnoid cyst in the left middle fossa.  There were no acute findings.  Vertebral arteries are  diminutive and the left terminates as the left posterior inferior cerebellar artery.  Posterior communicating arteries are present.  MRI [facility] 05/23/2021 showed  The internal auditory canals appear normal.     Scattered T2/FLAIR hyperintense foci in the hemispheres consistent with mild to moderate chronic microvascular ischemic change, unchanged compared to the previous MRI.   Mild generalized cortical atrophy, typical for age..   No acute findings.  Normal enhancement pattern.  MRI cervical spine 05/23/2021 showed    Mild degenerative changes at C3-C4 through C6-C7 that do not lead to nerve root compression or spinal stenosis.   The spinal cord is normal signal..   Asymmetric thyroid  gland.  There appears to be surgical resection of the right hemithyroid and there is heterogenous signal in the left hemithyroid.   MRI brain 02/02/2024 showed Mild generalized cortical atrophy, slightly progressed compared to the MRI from 05/23/2021.  Scattered T2/FLAIR hyperintense foci predominantly in the subcortical and deep white matter of the cerebral hemispheres.  A couple foci are noted in the pons.  None of the foci appear to be acute.  Compared to the MRI from 05/23/2021, there do not appear to be any new lesions.  LABS: 05/10/2021:    B12, SPEP/IEF, ssa/ssb, RF were normal   REVIEW OF SYSTEMS: Constitutional: No fevers, chills, sweats, or change in appetite Eyes: No visual changes, double vision, eye pain.  She has dry eyes. Ear,  nose and throat: No hearing loss, ear pain, nasal congestion, sore throat.  She had vertigo. Cardiovascular: No chest pain, palpitations Respiratory:  No shortness of breath at rest or with exertion.   No wheezes GastrointestinaI: No nausea, vomiting, diarrhea, abdominal pain, fecal incontinence Genitourinary:  No dysuria, urinary retention or frequency.  2 x  nocturia Musculoskeletal:  No neck pain, back pain Integumentary: No rash, pruritus, skin lesions Neurological: as  above Psychiatric: No depression at this time.  No anxiety Endocrine: Well controlled type 2 NIDDM Hematologic/Lymphatic:  No anemia, purpura, petechiae. Allergic/Immunologic: No itchy/runny eyes, nasal congestion, recent allergic reactions, rashes  ALLERGIES: Allergies  Allergen Reactions   Ephedrine  Palpitations    Heart rate fast-   Hibiclens [Chlorhexidine  Gluconate] Rash    SKIN REDNESS AND BURNING SENSATION    NO CHG   Keflex [Cephalexin] Itching and Rash    HOME MEDICATIONS:  Current Outpatient Medications:    aspirin  EC 81 MG tablet, Take 81 mg by mouth at bedtime. , Disp: , Rfl:    Coenzyme Q10 (COQ-10) 100 MG CAPS, Take 1 capsule by mouth daily., Disp: , Rfl:    CRANBERRY EXTRACT PO, Take by mouth. 2 daily by mouth, Disp: , Rfl:    docusate sodium (COLACE) 100 MG capsule, Take 200 mg by mouth at bedtime., Disp: , Rfl:    DULoxetine (CYMBALTA) 30 MG capsule, Take 1 capsule (30 mg total) by mouth daily., Disp: 30 capsule, Rfl: 11   famotidine  (PEPCID ) 20 MG tablet, One after supper, Disp: 30 tablet, Rfl: 11   furosemide (LASIX) 40 MG tablet, Take 40 mg by mouth daily., Disp: , Rfl:    gabapentin  (NEURONTIN ) 100 MG capsule, Take 1 capsule (100 mg total) by mouth 4 (four) times daily., Disp: 120 capsule, Rfl: 2   levothyroxine  (SYNTHROID ) 88 MCG tablet, TAKE 1 TABLET BY MOUTH DAILY, Disp: 100 tablet, Rfl: 2   loratadine (CLARITIN) 10 MG tablet, Take 10 mg by mouth daily., Disp: , Rfl:    losartan  (COZAAR ) 100 MG tablet, Take 1 tablet (100 mg total) by mouth every evening., Disp: , Rfl:    meclizine (ANTIVERT) 25 MG tablet, Take 12.5 mg by mouth at bedtime as needed., Disp: , Rfl:    mesalamine  (LIALDA ) 1.2 g EC tablet, Take 2 tablets (2.4 g total) by mouth daily with breakfast., Disp: 60 tablet, Rfl: 3   metoprolol succinate (TOPROL-XL) 100 MG 24 hr tablet, Take 100 mg by mouth every morning. Take with or immediately following a meal., Disp: , Rfl:    Multiple Vitamin  (MULTIVITAMIN) tablet, Take 1 tablet by mouth daily., Disp: , Rfl:    OXYGEN, Inhale 2 L into the lungs at bedtime., Disp: , Rfl:    OZEMPIC, 0.25 OR 0.5 MG/DOSE, 2 MG/1.5ML SOPN, Inject 1 mL into the skin once a week., Disp: , Rfl:    pantoprazole  (PROTONIX ) 40 MG tablet, Take 40 mg by mouth daily., Disp: , Rfl:    potassium chloride (KLOR-CON) 10 MEQ tablet, Take 10 mEq by mouth daily., Disp: , Rfl:    RABEprazole  (ACIPHEX ) 20 MG tablet, Take 1 tablet (20 mg total) by mouth daily., Disp: 90 tablet, Rfl: 3   simvastatin (ZOCOR) 80 MG tablet, Take 1 tablet by mouth at bedtime., Disp: , Rfl:    triamcinolone  cream (KENALOG ) 0.5 %, Apply 1 application topically 3 (three) times daily as needed. For rash/irritation., Disp: , Rfl:    vitamin C (ASCORBIC ACID) 500 MG tablet, Take 500 mg by  mouth daily., Disp: , Rfl:    Vitamin D, Cholecalciferol, 1000 UNITS CAPS, Take 1,000 mg by mouth 2 (two) times daily., Disp: , Rfl:    albuterol (VENTOLIN HFA) 108 (90 Base) MCG/ACT inhaler, Inhale 2 puffs into the lungs every 4 (four) hours as needed., Disp: , Rfl:   PAST MEDICAL HISTORY: Past Medical History:  Diagnosis Date   Anxiety    Cancer (HCC)    skin face   CHF (congestive heart failure) (HCC)    Depression    DJD (degenerative joint disease)    Dyspnea    Esophageal reflux    Essential hypertension    Fibrocystic breast disease    Hypothyroidism    Left bundle branch block    Transient - possibly rate related    Menopause    Osteoarthritis    Pneumonia    PONV (postoperative nausea and vomiting)    pt. would like to have a scopolamine  patch   Restless leg syndrome    Scoliosis    Type 2 diabetes mellitus (HCC)     PAST SURGICAL HISTORY: Past Surgical History:  Procedure Laterality Date   BREAST BIOPSY     lt x3   CARPOMETACARPEL SUSPENSION PLASTY Left 03/08/2014   Procedure: LEFT THUMB CARPOMETACARPEL (CMC) SUSPENSION PLASTY WITH TRAPEZIUM EXCISION;  Surgeon: Lamar LULLA Leonor Mickey.,  MD;  Location: [facility];  Service: Orthopedics;  Laterality: Left;   CATARACT EXTRACTION Bilateral    CESAREAN SECTION     CHOLECYSTECTOMY     COLONOSCOPY  03/26/2007   RMR:  Diffuse changes of proctocolitis as described above diminutive rectal polyp, cold biopsy/removed, pedunculated polyp of the splenic flexure hot snare removed.  Segmental biopsies the colon taken of the ileocecal valve ulcer biopsied normal terminal ileum   COLONOSCOPY N/A 01/05/2014   Procedure: COLONOSCOPY;  Surgeon: Claudis RAYMOND Rivet, MD;  Location: [facility];  Service: Endoscopy;  Laterality: N/A;  830   COLONOSCOPY N/A 06/08/2024   Procedure: COLONOSCOPY;  Surgeon: Albertus Gordy HERO, MD;  Location: [facility];  Service: Gastroenterology;  Laterality: N/A;  on oxygen   EYE SURGERY     GASTROCNEMIUS RECESSION Right 08/07/2016   Procedure: GASTROCNEMIUS RECESSION;  Surgeon: Jerona LULLA Sage, MD;  Location: [facility];  Service: Orthopedics;  Laterality: Right;   Joint fusion-foot     x2 LEFT FOOT   KNEE ARTHROPLASTY  2001   Right   KNEE ARTHROPLASTY  2002   Left   LEFT HEART CATH AND CORONARY ANGIOGRAPHY N/A 06/18/2018   Procedure: LEFT HEART CATH AND CORONARY ANGIOGRAPHY;  Surgeon: Verlin Lonni BIRCH, MD;  Location: [facility];  Service: Cardiovascular;  Laterality: N/A;   Left leg vein resection     RETINAL DETACHMENT SURGERY Left 02/25/2022   done in [facility]   ROTATOR CUFF REPAIR     LEFT   THYROIDECTOMY  1980   3/4   TONSILLECTOMY     TUBAL LIGATION     WEIL OSTEOTOMY Right 08/07/2016   Procedure: WEIL OSTEOTOMY 2nd and 3rd Metatarsal Right Foot;  Surgeon: Jerona LULLA Sage, MD;  Location: [facility];  Service: Orthopedics;  Laterality: Right;   WEIL OSTEOTOMY Left 12/25/2016   Procedure: 2nd Toe Proximal Interphalangeal Resection, Weil Osteotomy 2nd and 3rd Metatarsal Left Foot;  Surgeon: Jerona Sage LULLA, MD;  Location: [facility];  Service: Orthopedics;  Laterality: Left;    FAMILY HISTORY: Family  History  Problem Relation Age of Onset   Hypertension Mother  Heart failure Father        Died with pneumonia   Hypertension Father    Arthritis Father    Diabetes Father    COPD Brother    Asthma Paternal Grandfather    Emphysema Paternal Grandfather    Colon cancer Neg Hx    Esophageal cancer Neg Hx    Stomach cancer Neg Hx    Colon polyps Neg Hx    Pancreatic cancer Neg Hx     SOCIAL HISTORY:  Social History   Socioeconomic History   Marital status: Married    Spouse name: Not on file   Number of children: 1   Years of education: Not on file   Highest education level: Not on file  Occupational History   Occupation: retired  Tobacco Use   Smoking status: Never   Smokeless tobacco: Never  Vaping Use   Vaping status: Never Used  Substance and Sexual Activity   Alcohol  use: Yes    Alcohol /week: 0.0 standard drinks of alcohol     Comment: rare-- glass of wine at [employer]   Drug use: No   Sexual activity: Not on file  Other Topics Concern   Not on file  Social History Narrative   Not on file   Social Drivers of Health   Financial Resource Strain: Low Risk  (06/10/2023)   Received from [facility]   Overall Financial Resource Strain (CARDIA)    Difficulty of Paying Living Expenses: Not hard at all  Food Insecurity: No Food Insecurity (06/10/2023)   Received from [facility]   Hunger Vital Sign    Within the past 12 months, you worried that your food would run out before you got the money to buy more.: Never true    Within the past 12 months, the food you bought just didn't last and you didn't have money to get more.: Never true  Transportation Needs: No Transportation Needs (06/10/2023)   Received from [facility]    Lack of Transportation (Medical): No    Lack of Transportation (Non-Medical): No  Physical Activity: Not on file  Stress: Not on file  Social Connections: Not on file  Intimate Partner Violence: Not At  Risk (06/10/2023)   Received from [facility]   Humiliation, Afraid, Rape, and Kick questionnaire    Within the last year, have you been afraid of your partner or ex-partner?: No    Within the last year, have you been humiliated or emotionally abused in other ways by your partner or ex-partner?: No    Within the last year, have you been kicked, hit, slapped, or otherwise physically hurt by your partner or ex-partner?: No    Within the last year, have you been raped or forced to have any kind of sexual activity by your partner or ex-partner?: No     PHYSICAL EXAM  Vitals:   07/22/24 1453  BP: 132/79  Pulse: 71  Weight: 238 lb 8 oz (108.2 kg)  Height: 5' 5 (1.651 m)    Body mass index is 39.69 kg/m.   General: The patient is well-developed and well-nourished and in no acute distress  HEENT:  Head is [place]/AT.  Sclera are anicteric.      Skin: Extremities are without rash or  edema.   She has varicose veins    Neurologic Exam  Mental status: The patient is alert and oriented x 3 at the time of the examination. The patient has  apparent normal recent and remote memory, with an apparently normal attention span and concentration ability.   Speech is normal.  Cranial nerves: Extraocular movements are full. Facial strength and sensation are normal.     Mild reduced right hearing and Weber lateralizes right.     Motor:  Muscle bulk is normal.   Tone is normal. Strength is  5 / 5 in all 4 extremities including toes.   Sensory: Sensory testing is intact to pinprick, soft touch and vibration sensation in arms/hands except mild reduced PP right ulnar distribution of hand.    She has reduced vibration at ankles (50%%) and toes (0-10%)   Reduced touch/pp mild at ankles more at toes.   Coordination: Cerebellar testing reveals good finger-nose-finger and heel-to-shin bilaterally.  Gait and station: Station is normal.   Gait is normal for age. Tandem gait is mildly wide for age .  The was  no Romberg sign    Reflexes: Deep tendon reflexes are symmetric and normal in arms, absent in legs.        ASSESSMENT AND PLAN   Polyneuropathy  Other peripheral vertigo, unspecified ear  Long COVID  Memory loss  Dysesthesia    She appears to have a mild small fiber > large fiber polyneuropathy.  Most likely this is due to her diabetes  Continue   gabapentin  to 100-100-200 mg over the day or higher.   change fluoxetine to Cymbalta and increase from 30 to 60 based on results.    Mild memory issues more likely to be due to reduce focus than AD or other neurodegenerative --- if worsens, check ATN profile.    She will return to see us  in 6 months or sooner if there are new or worsening neurologic symptoms.    This visit is part of a comprehensive longitudinal care medical relationship regarding the patients primary diagnosis of polyneuropathy and related concerns.   Lindy Garczynski A. Vear, MD, [facility] 07/22/2024, 3:35 PM Certified in Neurology, Clinical Neurophysiology, Sleep Medicine and Neuroimaging  [facility] [address] [place], [place] [address] [phone]

## 2024-07-27 ENCOUNTER — Ambulatory Visit (HOSPITAL_COMMUNITY)
Admission: RE | Admit: 2024-07-27 | Discharge: 2024-07-27 | Disposition: A | Discharge status: Home / Self Care | Source: Ambulatory Visit | Attending: Nurse Practitioner | Admitting: Nurse Practitioner

## 2024-07-27 DIAGNOSIS — E041 Nontoxic single thyroid nodule: Secondary | ICD-10-CM | POA: Diagnosis not present

## 2024-07-27 DIAGNOSIS — E890 Postprocedural hypothyroidism: Secondary | ICD-10-CM | POA: Diagnosis not present

## 2024-07-27 NOTE — Progress Notes (Signed)
 Grace Delacruz                                          MRN: [id number]   07/27/2024   The [facility] Quality Team Specialist reviewed this patient medical record for the purposes of chart review for care gap closure. The following were reviewed: chart review for care gap closure-kidney health evaluation for diabetes:uACR.    [facility] Quality Team

## 2024-07-30 DIAGNOSIS — R300 Dysuria: Secondary | ICD-10-CM | POA: Diagnosis not present

## 2024-08-12 DIAGNOSIS — E890 Postprocedural hypothyroidism: Secondary | ICD-10-CM | POA: Diagnosis not present

## 2024-08-12 DIAGNOSIS — E041 Nontoxic single thyroid nodule: Secondary | ICD-10-CM | POA: Diagnosis not present

## 2024-08-13 LAB — TSH: TSH: 0.7 u[IU]/mL (ref 0.450–4.500)

## 2024-08-13 LAB — T4, FREE: Free T4: 1.37 ng/dL (ref 0.82–1.77)

## 2024-08-16 NOTE — Progress Notes (Addendum)
 HPI F for sleep evaluation with hx Nocturnal Hypoxemia. Saw Dr Darlean after Covid pneumonia, Respiratory Failure August, 2024. Did not desat on walk test. NPSG 11/05/13- AHI/ 2.4/hr, desat to 85%transiently, body weight 253 lbs Medical problem list includes HTN, DM2, Hypothyroid, Polyneuropathy, Morbid Obesity, Chronic Cough,  ONO on Aug 28 2023  desat x 2 h 8 min @ < 89%  >  09/08/2023   rec 2lpm and repeat on 2lpm performed  on 16th Dec [employer] - remained 90% and above all night. Overnight Oximetry on room air 02/10/24-  </= 88% for 48 minutes HST 12/07/23- AHI 11.5/hr, desat to 84%, time </= 88% was 13 minutes on room air O2 ordered [employer] 09/04/23- 2L sleep and POC PFT 12/02/23- WNL  ===========================================================================================================     04/16/24- 78 yoF for sleep evaluation with hx Nocturnal Hypoxemia. Saw Dr Darlean after Covid pneumonia, Respiratory Failure August, 2024. Did not desat on walk test. NPSG 11/05/13- AHI/ 2.4/hr, desat to 85%transiently, body weight 253 lbs Medical problem list includes HTN, DM2, Hypothyroid, Polyneuropathy, Morbid Obesity, Chronic Cough,  ONO on Aug 28 2023  desat x 2 h 8 min @ < 89%  >  09/08/2023   rec 2lpm and repeat on 2lpm performed  on 16th Dec [employer] - remained 90% and above all night. HST 12/07/23- AHI 11.5/hr, desat to 84%, time </= 88% was 13 minutes on room air O2 ordered [employer] 09/04/23- 2L sleep and POC PFT 12/02/23- WNL Body weight today-236 lbs Overnight Oximetry on room air 02/10/24-  </= 88% for 48 minutes In March we ordered D[facility] [employer] to service noisy O2 concentrator>> status? Dr Darlean saw in f/u for Hypoxemia, [place] in May>> should turn care over to him. <<<<<<<<< New concentrator is quieter, but she still wants to sleep without O2. Discussed the use of AI scribe software for clinical note transcription with the patient, who gave verbal consent to proceed.  History of Present Illness   Grace Delacruz is a 78 year old female with respiratory issues who presents with concerns about discontinuing home oxygen therapy. She was referred by her family doctor for persistent cough and respiratory issues.  She removes her oxygen at 5 AM and sleeps without it for a couple of hours, feeling stable during this period. She is cautious about discontinuing oxygen therapy abruptly. Her last overnight oximetry on April 22 showed oxygen saturation at or below 88% for 48 minutes. She is interested in a pulse oximeter with an alarm for nighttime monitoring. She recalls a previous episode with oxygen levels dropping to 75%, which was alarming. Since recovering from a recent illness, she feels improved but not fully recovered. She uses a new, quieter oxygen concentrator and sleeps with the head of the bed elevated to aid breathing.     Assessment and Plan:   Chronic Respiratory Failure with Hypoxemia Chronic respiratory failure Chronic respiratory failure with improved symptoms. Last oximetry showed desaturation for 48 minutes. No cardiac or stroke concerns. - Trial off oxygen therapy if comfortable, resume if symptoms worsen. - Recommend purchasing pulse oximeter with alarm for night monitoring. - Advise elevating head of bed to aid breathing. - Instruct to inform cardiologist about intermittent oxygen use. - Schedule follow-up in four months to reassess oxygen needs.   Obesity- suspect nocturnal problem is Obesity Hypoventilation -encourage weight loss   08/17/24- 78 yoF for sleep evaluation with hx Nocturnal Hypoxemia, OSA, Probable OHS, Obesity,   Saw Dr Darlean after Covid pneumonia, Respiratory Failure  August, 2024. Did not desat on walk test. O2 2L/sleep/ [employer]    ordered March, 2025 HST 12/07/23- AHI 11.5/hr, desat to 84%, time </= 88% was 13 minutes on room ai Body weight today-238 lbs Need treatment for OSA??> After discussion, she chooses not to treat mild OSA. She will continue to sleep with  O2.. Encouraged to sleep off back and keep weight down. Had flu vax. Discussed the use of AI scribe software for clinical note transcription with the patient, who gave verbal consent to proceed.  History of Present Illness   Grace Delacruz is a 78 year old female with mild sleep apnea who presents with nighttime coughing. She was referred by Dr. Darlean for evaluation of sleep issues.  She experiences nighttime coughing, waking up several times last night due to this symptom. She sleeps with her head elevated. She uses oxygen therapy for mild sleep apnea, diagnosed in February. She feels she has not fully recovered from COVID-19, which she contracted a year ago, and has not returned to her pre-COVID state.     Assessment and Plan   Nocturnal hypoxemia Mild obstructive sleep apnea Mild obstructive sleep apnea managed with supplemental oxygen. No significant symptoms warranting further intervention. - Continue supplemental oxygen.  Nocturnal cough possibly related to gastroesophageal reflux Intermittent nocturnal cough possibly due to gastroesophageal reflux. Sleeping with head elevated as non-pharmacological intervention. - Continue sleeping with head elevated.  Chronic fatigue following COVID-19 infection (possible long COVID) Persistent fatigue post-COVID-19 suggests possible long COVID. No definitive test or treatment available. Frequent UTIs noted, with upcoming urologist appointment. Potential role of epigenetic markers in diagnosis, though treatment application uncertain. - Follow up with urologist for frequent UTIs. - Monitor developments in long COVID diagnostics and treatment.      ADDENDUM - 09/01/24- Stable and clear from Pulmonary standpoint for necessary thyroid  surgery/ GOT, recognizing need for O2 supplementation and monitoring when asleep and in Recovery room. Mild obstructive sleep apnea. Pulmonary consultation available at time of surgery if needed.          ROS-see  HPI   + = positive Constitutional:    weight loss, night sweats, fevers, chills, fatigue, lassitude. HEENT:    headaches, difficulty swallowing, tooth/dental problems, sore throat,       sneezing, itching, ear ache, nasal congestion, post nasal drip, snoring CV:    chest pain, orthopnea, PND, swelling in lower extremities, anasarca,                                   dizziness, palpitations Resp:   shortness of breath with exertion or at rest.                productive cough,   non-productive cough, coughing up of blood.              change in color of mucus.  wheezing.   Skin:    rash or lesions. GI:  No-   heartburn, indigestion, abdominal pain, nausea, vomiting, diarrhea,                 change in bowel habits, loss of appetite GU: dysuria, change in color of urine, no urgency or frequency.   flank pain. MS:   joint pain, stiffness, decreased range of motion, back pain. Neuro-     nothing unusual Psych:  change in mood or affect.  depression or anxiety.   memory loss.  OBJ- Physical Exam General- Alert, Oriented, Affect-appropriate, Distress- none acute,+ Obese Skin- rash-none, lesions- none, excoriation- none Lymphadenopathy- none Head- atraumatic            Eyes- Gross vision intact, PERRLA, conjunctivae and secretions clear            Ears- Hearing, canals-normal            Nose- Clear, no-Septal dev, mucus, polyps, erosion, perforation             Throat- Mallampati III-IV , mucosa clear , drainage- none, tonsils- absent, +teeth Neck- flexible , trachea midline, no stridor , thyroid  nl, carotid no bruit Chest - symmetrical excursion , unlabored           Heart/CV- RRR , no murmur , no gallop  , no rub, nl s1 s2                           - JVD- none , edema- none, stasis changes- none, varices- none           Lung- +few crackles, wheeze-none, cough- none , dullness-none, rub- none           Chest wall-  Abd-  Br/ Gen/ Rectal- Not done, not indicated Extrem- cyanosis- none,  clubbing, none, atrophy- none, strength- nl Neuro- grossly intact to observation

## 2024-08-17 ENCOUNTER — Encounter: Payer: Self-pay | Admitting: Internal Medicine

## 2024-08-17 ENCOUNTER — Ambulatory Visit: Admitting: Internal Medicine

## 2024-08-17 VITALS — BP 137/81 | HR 69 | Temp 98.7°F | Ht 64.0 in | Wt 238.8 lb

## 2024-08-17 DIAGNOSIS — R0902 Hypoxemia: Secondary | ICD-10-CM | POA: Diagnosis not present

## 2024-08-17 DIAGNOSIS — R0609 Other forms of dyspnea: Secondary | ICD-10-CM

## 2024-08-17 DIAGNOSIS — R5382 Chronic fatigue, unspecified: Secondary | ICD-10-CM

## 2024-08-17 DIAGNOSIS — G4734 Idiopathic sleep related nonobstructive alveolar hypoventilation: Secondary | ICD-10-CM

## 2024-08-17 DIAGNOSIS — R058 Other specified cough: Secondary | ICD-10-CM

## 2024-08-17 DIAGNOSIS — G4733 Obstructive sleep apnea (adult) (pediatric): Secondary | ICD-10-CM

## 2024-08-17 DIAGNOSIS — U099 Post covid-19 condition, unspecified: Secondary | ICD-10-CM | POA: Diagnosis not present

## 2024-08-17 NOTE — Patient Instructions (Signed)
 We agreed for now to continue oxygen when you sleep, but not to try treating the mild sleep apnea. You can follow with Dr Darlean at his [place] office.

## 2024-08-18 ENCOUNTER — Ambulatory Visit: Admitting: Nurse Practitioner

## 2024-08-18 ENCOUNTER — Encounter: Payer: Self-pay | Admitting: Nurse Practitioner

## 2024-08-18 VITALS — BP 130/80 | HR 68 | Ht 64.0 in | Wt 238.8 lb

## 2024-08-18 DIAGNOSIS — E890 Postprocedural hypothyroidism: Secondary | ICD-10-CM | POA: Diagnosis not present

## 2024-08-18 DIAGNOSIS — E041 Nontoxic single thyroid nodule: Secondary | ICD-10-CM | POA: Diagnosis not present

## 2024-08-18 MED ORDER — LEVOTHYROXINE SODIUM 88 MCG PO TABS: 88.0000 ug | ORAL_TABLET | Freq: Every day | ORAL | 3 refills | Status: DC

## 2024-08-18 NOTE — Progress Notes (Signed)
 Endocrinology Consult Note                                         08/18/2024, 11:25 AM  Subjective:   Subjective    Grace Delacruz is a 78 y.o.-year-old female patient being seen in consultation for hypothyroidism referred by Trudy Vaughn FALCON, MD.  She was recently hospitalized in August with COVID pneumonia, sepsis, UTI, and CHF.  During the hospitalization she had CT scan of chest which incidentally noted a 2.8 left thyroid  nodule.  Patient reports she has 3/4 of her thyroid  removed at age 59 years old from nodules on the thyroid .  She has been on Levothyroxine  ever since, was previously on 125 mcg but reduced recently for dropping TSH numbers.   Past Medical History:  Diagnosis Date   Anxiety    Cancer (HCC)    skin face   CHF (congestive heart failure) (HCC)    Depression    DJD (degenerative joint disease)    Dyspnea    Esophageal reflux    Essential hypertension    Fibrocystic breast disease    Hypothyroidism    Left bundle branch block    Transient - possibly rate related    Menopause    Osteoarthritis    Pneumonia    PONV (postoperative nausea and vomiting)    pt. would like to have a scopolamine  patch   Restless leg syndrome    Scoliosis    Type 2 diabetes mellitus (HCC)     Past Surgical History:  Procedure Laterality Date   BREAST BIOPSY     lt x3   CARPOMETACARPEL SUSPENSION PLASTY Left 03/08/2014   Procedure: LEFT THUMB CARPOMETACARPEL (CMC) SUSPENSION PLASTY WITH TRAPEZIUM EXCISION;  Surgeon: Lamar LULLA Leonor Mickey., MD;  Location:  S[facility]ERY [facility];  Service: Orthopedics;  Laterality: Left;   CATARACT EXTRACTION Bilateral    CESAREAN SECTION     CHOLECYSTECTOMY     COLONOSCOPY  03/26/2007   RMR:  Diffuse changes of proctocolitis as described above diminutive rectal polyp, cold biopsy/removed, pedunculated polyp of the splenic flexure hot snare removed.  Segmental biopsies the  colon taken of the ileocecal valve ulcer biopsied normal terminal ileum   COLONOSCOPY N/A 01/05/2014   Procedure: COLONOSCOPY;  Surgeon: Claudis RAYMOND Rivet, MD;  [facility]: [facility];  Service: Endoscopy;  Laterality: N/A;  830   COLONOSCOPY N/A 06/08/2024   Procedure: COLONOSCOPY;  Surgeon: Albertus Gordy HERO, MD;  Location: [facility];  Service: Gastroenterology;  Laterality: N/A;  on oxygen   EYE SURGERY     GASTROCNEMIUS RECESSION Right 08/07/2016   Procedure: GASTROCNEMIUS RECESSION;  Surgeon: Jerona LULLA Sage, MD;  Location: [facility];  Service: Orthopedics;  Laterality: Right;   Joint fusion-foot     x2 LEFT FOOT   KNEE ARTHROPLASTY  2001   Right   KNEE ARTHROPLASTY  2002   Left   LEFT HEART CATH AND CORONARY ANGIOGRAPHY N/A 06/18/2018   Procedure: LEFT HEART CATH AND CORONARY ANGIOGRAPHY;  Surgeon: Verlin Lonni BIRCH, MD;  Location: [facility] CV [facility];  Service: Cardiovascular;  Laterality: N/A;   Left leg vein resection     RETINAL DETACHMENT SURGERY Left 02/25/2022   done in [facility]   ROTATOR CUFF REPAIR     LEFT   THYROIDECTOMY  1980   3/4   TONSILLECTOMY     TUBAL LIGATION     WEIL OSTEOTOMY Right 08/07/2016   Procedure: WEIL OSTEOTOMY 2nd and 3rd Metatarsal Right Foot;  Surgeon: Jerona LULLA Sage, MD;  Location: [facility];  Service: Orthopedics;  Laterality: Right;   WEIL OSTEOTOMY Left 12/25/2016   Procedure: 2nd Toe Proximal Interphalangeal Resection, Weil Osteotomy 2nd and 3rd Metatarsal Left Foot;  Surgeon: Jerona Sage LULLA, MD;  Location: [facility];  Service: Orthopedics;  Laterality: Left;    Social History   Socioeconomic History   Marital status: Married    Spouse name: Not on file   Number of children: 1   Years of education: Not on file   Highest education level: Not on file  Occupational History   Occupation: retired  Tobacco Use   Smoking status: Never    Passive exposure: Never   Smokeless tobacco: Never  Vaping Use   Vaping status: Never Used  Substance and Sexual  Activity   Alcohol  use: Yes    Alcohol /week: 0.0 standard drinks of alcohol     Comment: rare-- glass of wine at [employer]   Drug use: No   Sexual activity: Not on file  Other Topics Concern   Not on file  Social History Narrative   Not on file   Social Drivers of Health   Financial Resource Strain: Low Risk  (06/10/2023)   Received from [facility]   Overall Financial Resource Strain (CARDIA)    Difficulty of Paying Living Expenses: Not hard at all  Food Insecurity: No Food Insecurity (06/10/2023)   Received from [facility]   Hunger Vital Sign    Within the past 12 months, you worried that your food would run out before you got the money to buy more.: Never true    Within the past 12 months, the food you bought just didn't last and you didn't have money to get more.: Never true  Transportation Needs: No Transportation Needs (06/10/2023)   Received from [facility] - Transportation    Lack of Transportation (Medical): No    Lack of Transportation (Non-Medical): No  Physical Activity: Not on file  Stress: Not on file  Social Connections: Not on file    Family History  Problem Relation Age of Onset   Hypertension Mother    Heart failure Father        Died with pneumonia   Hypertension Father    Arthritis Father    Diabetes Father    COPD Brother    Asthma Paternal Grandfather    Emphysema Paternal Grandfather    Colon cancer Neg Hx    Esophageal cancer Neg Hx    Stomach cancer Neg Hx    Colon polyps Neg Hx    Pancreatic cancer Neg Hx     Outpatient Encounter Medications as of 08/18/2024  Medication Sig   aspirin  EC 81 MG tablet Take 81 mg by mouth at bedtime.    Coenzyme Q10 (COQ-10) 100 MG CAPS Take 1 capsule by mouth daily.   CRANBERRY EXTRACT PO Take by mouth. 2 daily by mouth   docusate sodium (COLACE) 100 MG capsule Take 200  mg by mouth at bedtime.   DULoxetine (CYMBALTA) 30 MG capsule Take 1 capsule (30 mg total) by mouth daily.    famotidine  (PEPCID ) 20 MG tablet One after supper   furosemide (LASIX) 40 MG tablet Take 40 mg by mouth daily.   gabapentin  (NEURONTIN ) 100 MG capsule Take 1 capsule (100 mg total) by mouth 4 (four) times daily.   loratadine (CLARITIN) 10 MG tablet Take 10 mg by mouth daily.   losartan  (COZAAR ) 100 MG tablet Take 1 tablet (100 mg total) by mouth every evening.   meclizine (ANTIVERT) 25 MG tablet Take 12.5 mg by mouth at bedtime as needed.   mesalamine  (LIALDA ) 1.2 g EC tablet Take 2 tablets (2.4 g total) by mouth daily with breakfast.   metoprolol succinate (TOPROL-XL) 100 MG 24 hr tablet Take 100 mg by mouth every morning. Take with or immediately following a meal.   Multiple Vitamin (MULTIVITAMIN) tablet Take 1 tablet by mouth daily.   OXYGEN Inhale 2 L into the lungs at bedtime.   OZEMPIC, 0.25 OR 0.5 MG/DOSE, 2 MG/1.5ML SOPN Inject 1 mL into the skin once a week.   pantoprazole  (PROTONIX ) 40 MG tablet Take 40 mg by mouth daily.   potassium chloride (KLOR-CON) 10 MEQ tablet Take 10 mEq by mouth daily.   simvastatin (ZOCOR) 80 MG tablet Take 1 tablet by mouth at bedtime.   triamcinolone  cream (KENALOG ) 0.5 % Apply 1 application topically 3 (three) times daily as needed. For rash/irritation.   vitamin C (ASCORBIC ACID) 500 MG tablet Take 500 mg by mouth daily.   Vitamin D, Cholecalciferol, 1000 UNITS CAPS Take 1,000 mg by mouth 2 (two) times daily.   [DISCONTINUED] levothyroxine  (SYNTHROID ) 88 MCG tablet TAKE 1 TABLET BY MOUTH DAILY   levothyroxine  (SYNTHROID ) 88 MCG tablet Take 1 tablet (88 mcg total) by mouth daily.   RABEprazole  (ACIPHEX ) 20 MG tablet Take 1 tablet (20 mg total) by mouth daily. (Patient not taking: Reported on 08/18/2024)   No facility-administered encounter medications on file as of 08/18/2024.    ALLERGIES: Allergies  Allergen Reactions   Ephedrine  Palpitations    Heart rate fast-   Hibiclens [Chlorhexidine  Gluconate] Rash    SKIN REDNESS AND BURNING SENSATION     NO CHG   Keflex [Cephalexin] Itching and Rash   VACCINATION STATUS: Immunization History  Administered Date(s) Administered   Influenza,inj,Quad PF,6+ Mos 07/26/2013   Influenza-Unspecified 05/21/2014   PNEUMOCOCCAL CONJUGATE-20 04/22/2023   Pneumococcal Conjugate-13 10/22/2011   Pneumococcal Polysaccharide-23 07/28/2012   Td (Adult),5 Lf Tetanus Toxid, Preservative Free 01/23/2013   Tdap 04/22/2023   Zoster Recombinant(Shingrix) 02/08/2019, 05/08/2019     HPI   TITA TERHAAR  is a patient with the above medical history. she was diagnosed with hypothyroidism at approximate age of 49 years after she had 3/4 of her thyroid  removed due to nodules, which required subsequent initiation of thyroid  hormone replacement therapy. she was given various doses of Levothyroxine  over the years, currently on 88 micrograms. she reports compliance to this medication but does note she takes it with her other morning medications.  She was also incidentally noted to have a 2.8 cm thyroid  nodule on the left thyroid  lobe on 06/06/23 on a CT scan.  I reviewed patient's thyroid  tests:  Lab Results  Component Value Date   TSH 0.700 08/12/2024   TSH 0.989 01/26/2024   TSH 0.144 (L) 07/30/2023   TSH 0.09 (A) 06/26/2023   FREET4 1.37 08/12/2024   FREET4 1.32 01/26/2024  FREET4 1.35 07/30/2023     Pt describes: - tremors - occasional constipation - temperature fluctuations - hoarseness  Pt denies feeling nodules in neck, dysphagia/odynophagia, SOB with lying down.  she denies know family history of thyroid  disorders, no family history of thyroid  cancer.  No history of radiation therapy to head or neck.  No recent use of iodine  supplements.  Denies use of Biotin containing supplements other than her womens + 50 MVI.  I reviewed her chart and she also has a history of GERD, HTN, DM, CHF, Depression, Scoliosis.   Review of systems  Constitutional: + Minimally fluctuating body weight,  current  Body mass index is 40.99 kg/m. , no fatigue, no subjective hyperthermia, no subjective hypothermia Eyes: no blurry vision, no xerophthalmia ENT: no sore throat, no nodules palpated in throat, no dysphagia/odynophagia, + hoarseness- also has GERD Cardiovascular: no chest pain, no shortness of breath, no palpitations, no leg swelling Respiratory: + cough- night time mostly (PCP thought related to GERD), + shortness of breath- wears oxygen at night Gastrointestinal: no nausea/vomiting/diarrhea Musculoskeletal: no muscle/joint aches Skin: no rashes, no hyperemia Neurological: no tremors, no numbness, no tingling, no dizziness Psychiatric: no depression, no anxiety   Objective:   Objective     BP 130/80 (BP Location: Left Arm, Patient Position: Sitting, Cuff Size: Large)   Pulse 68   Ht 5' 4 (1.626 m)   Wt 238 lb 12.8 oz (108.3 kg)   BMI 40.99 kg/m  Wt Readings from Last 3 Encounters:  08/18/24 238 lb 12.8 oz (108.3 kg)  08/17/24 238 lb 12.8 oz (108.3 kg)  07/22/24 238 lb 8 oz (108.2 kg)    BP Readings from Last 3 Encounters:  08/18/24 130/80  08/17/24 137/81  07/22/24 132/79      Physical Exam- Limited  Constitutional:  Body mass index is 40.99 kg/m. , not in acute distress, normal state of mind Eyes:  EOMI, no exophthalmos Musculoskeletal: no gross deformities, strength intact in all four extremities, no gross restriction of joint movements Skin:  no rashes, no hyperemia Neurological: no tremor with outstretched hands   CMP ( most recent) CMP     Component Value Date/Time   NA 142 06/12/2018 1151   K 3.9 06/12/2018 1151   CL 105 06/12/2018 1151   CO2 29 06/12/2018 1151   GLUCOSE 157 (H) 06/12/2018 1151   BUN 17 06/12/2018 1151   CREATININE 1.17 (H) 06/12/2018 1151   CALCIUM 9.7 06/12/2018 1151   PROT 6.5 12/24/2023 1057   GFRNONAA 46 (L) 06/12/2018 1151     Diabetic Labs (most recent): Lab Results  Component Value Date   HGBA1C 6.6 (H) 12/20/2016    HGBA1C 6.7 (H) 08/05/2016     Lipid Panel ( most recent) Lipid Panel  No results found for: CHOL, TRIG, HDL, CHOLHDL, VLDL, LDLCALC, LDLDIRECT, LABVLDL     Lab Results  Component Value Date   TSH 0.700 08/12/2024   TSH 0.989 01/26/2024   TSH 0.144 (L) 07/30/2023   TSH 0.09 (A) 06/26/2023   FREET4 1.37 08/12/2024   FREET4 1.32 01/26/2024   FREET4 1.35 07/30/2023    Thyroid  US  from 08/04/23 CLINICAL DATA:  Left thyroid  nodule seen on recent CT   Prior partial right thyroidectomy   EXAM: THYROID  ULTRASOUND   TECHNIQUE: Ultrasound examination of the thyroid  gland and adjacent soft tissues was performed.   COMPARISON:  None available.   FINDINGS: Parenchymal Echotexture: Mildly heterogenous   Isthmus: 0.4 cm   Right lobe:  No abnormality of the right thyroidectomy bed   Left lobe: 6.6 x 2.7 x 3.8 cm   _________________________________________________________   Estimated total number of nodules >/= 1 cm: 1   Number of spongiform nodules >/=  2 cm not described below (TR1): 0   Number of mixed cystic and solid nodules >/= 1.5 cm not described below (TR2): 0   _________________________________________________________   Nodule # 1:   Location: Left; mid   Maximum size: 3.3 cm; Other 2 dimensions: 2.9 x 2.7 cm   Composition: solid/almost completely solid (2)   Echogenicity: isoechoic (1)   Shape: not taller-than-wide (0)   Margins: ill-defined (0)   Echogenic foci: none (0)   ACR TI-RADS total points: 3.   ACR TI-RADS risk category: TR3 (3 points).   ACR TI-RADS recommendations:   **Given size (>/= 2.5 cm) and appearance, fine needle aspiration of this mildly suspicious nodule should be considered based on TI-RADS criteria.   IMPRESSION: Nodule 1 (TI-RADS 3), measuring 3.3 cm, located in the mid left thyroid  lobe, meets criteria for FNA.   The above is in keeping with the ACR TI-RADS recommendations - J Am Coll Radiol  2017;14:587-595.     Electronically Signed   By: Aliene Lloyd M.D.   On: 08/05/2023 12:58  Thyroid  ultrasound     Latest Reference Range & Units 06/26/23 00:00 07/30/23 11:00 01/26/24 11:16 08/12/24 10:21  TSH 0.450 - 4.500 uIU/mL 0.09 ! (E) 0.144 (L) 0.989 0.700  T4,Free(Direct) 0.82 - 1.77 ng/dL  8.64 8.67 8.62  Thyroperoxidase Ab SerPl-aCnc 0 - 34 IU/mL  <9    Thyroglobulin Antibody 0.0 - 0.9 IU/mL  <1.0    !: Data is abnormal (L): Data is abnormally low (E): External lab result  Assessment & Plan:   ASSESSMENT / PLAN:  1. Hypothyroidism-postsurgical 2. Thyroid  nodules  Thyroid  antibodies were negative, ruling out autoimmune thyroid  dysfunction. -Patient with long-standing hypothyroidism, on levothyroxine  therapy.   -Her previsit TFTs are consistent with appropriate hormone replacement.  She is advised to continue Levothyroxine  88 mcg po daily before breakfast.  Will recheck labs prior to next visit and adjust dose accordingly.  - We discussed about correct intake of levothyroxine , at fasting, with water , separated by at least 30 minutes from breakfast, and separated by more than 4 hours from calcium, iron, multivitamins, acid reflux medications (PPIs). -Patient is made aware of the fact that thyroid  hormone replacement is needed for life, dose to be adjusted by periodic monitoring of thyroid  function tests.  Her repeat thyroid  ultrasound did show mild enlargement of the 3.3 cm nodule in the left mid thyroid  gland--- now 4 cm which has been biopsied in the past and was consistent with benign follicular nodule.  I do not feel that she needs to have this re-biopsied at this time.  She also had a new 2 cm nodule in the left lower thyroid  gland which meets criteria for surveillance.  Will plan to repeat thyroid  ultrasound in 6 months and if thyroid  nodules have continued to increase in size, may reconsider re-biopsying the nodule.  She does have follow up with Dr. Eletha next month  as well.      I spent  37  minutes in the care of the patient today including review of labs from Thyroid  Function, CMP, and other relevant labs ; imaging/biopsy records (current and previous including abstractions from other facilities); face-to-face time discussing  her lab results and symptoms, medications doses, her options of short and long  term treatment based on the latest standards of care / guidelines;   and documenting the encounter.  [name]  participated in the discussions, expressed understanding, and voiced agreement with the above plans.  All questions were answered to her satisfaction. she is encouraged to contact clinic should she have any questions or concerns prior to her return visit.   FOLLOW UP PLAN:  Return in about 6 months (around 02/16/2025) for Thyroid  follow up, Previsit labs, thyroid  ultrasound.  Benton Rio, [facility] [address] [place], [place] [address] Phone: [phone] Fax: [phone]  08/18/2024, 11:25 AM

## 2024-08-18 NOTE — Patient Instructions (Signed)

## 2024-08-26 ENCOUNTER — Ambulatory Visit: Payer: Self-pay | Admitting: Surgery

## 2024-08-31 ENCOUNTER — Telehealth: Payer: Self-pay | Admitting: Internal Medicine

## 2024-08-31 NOTE — Telephone Encounter (Signed)
 Fax received from Dr. Krystal Spinner with [facility]  to perform a thyroid  surgery under general anesthesia on patient.  Patient needs surgery clearance. Surgery is pending. Patient was seen on 08/17/24. Office protocol is a risk assessment can be sent to surgeon if patient has been seen in 60 days or less.   Sending to Dr Neysa for risk assessment or recommendations if patient needs to be seen in office prior to surgical procedure.

## 2024-09-01 ENCOUNTER — Encounter: Payer: Self-pay | Admitting: Urology

## 2024-09-01 ENCOUNTER — Ambulatory Visit: Admitting: Urology

## 2024-09-01 VITALS — BP 144/82 | HR 67

## 2024-09-01 DIAGNOSIS — N3281 Overactive bladder: Secondary | ICD-10-CM | POA: Diagnosis not present

## 2024-09-01 DIAGNOSIS — N390 Urinary tract infection, site not specified: Secondary | ICD-10-CM | POA: Diagnosis not present

## 2024-09-01 DIAGNOSIS — Z87440 Personal history of urinary (tract) infections: Secondary | ICD-10-CM | POA: Diagnosis not present

## 2024-09-01 LAB — MICROSCOPIC EXAMINATION: Epithelial Cells (non renal): >10 /HPF — AB (ref 0–10)

## 2024-09-01 LAB — URINALYSIS, ROUTINE W REFLEX MICROSCOPIC
Bilirubin, UA: NEGATIVE
Glucose, UA: NEGATIVE
Ketones, UA: NEGATIVE
Nitrite, UA: NEGATIVE
Protein,UA: NEGATIVE
RBC, UA: NEGATIVE
Specific Gravity, UA: 1.025 (ref 1.005–1.030)
Urobilinogen, Ur: 1 mg/dL (ref 0.2–1.0)
pH, UA: 6 (ref 5.0–7.5)

## 2024-09-01 LAB — BLADDER SCAN AMB NON-IMAGING: Scan Result: 0

## 2024-09-01 MED ORDER — GEMTESA 75 MG PO TABS: 1.0000 | ORAL_TABLET | Freq: Every day | ORAL | Status: AC

## 2024-09-01 NOTE — Telephone Encounter (Signed)
 Addendum for surgical clearance added to my last office note.

## 2024-09-01 NOTE — Progress Notes (Signed)
   Patient can void prior to the bladder scan. Bladder scan result: 0  Performed By: [facility] LPN

## 2024-09-01 NOTE — Progress Notes (Signed)
 09/01/2024 2:00 PM   Grace Delacruz [birth year] [id number]  Referring provider: Trudy Vaughn FALCON, MD [address] [place],  [place] [address]  Frequent UTI   HPI: Grace Delacruz is a 78yo here for evaluation fro frequent UTI. 1 year ago she had COVID, pneumonia and was hospitalized with a UTI. 6 months ago she started getting UTIs every month. She was treated 6 times for a UTI in the past 6 months. She is usually treated with macrobid or cipro. She was last treated for a UTI 3 weeks ago. Currently her urine has a foul odor. She uses 2-5 pads per day. She has mild SUI and large volume urge incontinence. She takes lasix 40mg  daily.    PMH: Past Medical History:  Diagnosis Date   Anxiety    Cancer (HCC)    skin face   CHF (congestive heart failure) (HCC)    Depression    DJD (degenerative joint disease)    Dyspnea    Esophageal reflux    Essential hypertension    Fibrocystic breast disease    Hypothyroidism    Left bundle branch block    Transient - possibly rate related    Menopause    Osteoarthritis    Pneumonia    PONV (postoperative nausea and vomiting)    pt. would like to have a scopolamine  patch   Restless leg syndrome    Scoliosis    Type 2 diabetes mellitus (HCC)     Surgical History: Past Surgical History:  Procedure Laterality Date   BREAST BIOPSY     lt x3   CARPOMETACARPEL SUSPENSION PLASTY Left 03/08/2014   Procedure: LEFT THUMB CARPOMETACARPEL (CMC) SUSPENSION PLASTY WITH TRAPEZIUM EXCISION;  Surgeon: Lamar LULLA Leonor Mickey., MD;  Location: [facility];  Service: Orthopedics;  Laterality: Left;   CATARACT EXTRACTION Bilateral    CESAREAN SECTION     CHOLECYSTECTOMY     COLONOSCOPY  03/26/2007   RMR:  Diffuse changes of proctocolitis as described above diminutive rectal polyp, cold biopsy/removed, pedunculated polyp of the splenic flexure hot snare removed.  Segmental biopsies the colon taken of the ileocecal valve ulcer biopsied normal terminal  ileum   COLONOSCOPY N/A 01/05/2014   Procedure: COLONOSCOPY;  Surgeon: Claudis RAYMOND Rivet, MD;  [facility];  Service: Endoscopy;  Laterality: N/A;  830   COLONOSCOPY N/A 06/08/2024   Procedure: COLONOSCOPY;  Surgeon: Albertus Gordy HERO, MD;  Location: [facility];  Service: Gastroenterology;  Laterality: N/A;  on oxygen   EYE SURGERY     GASTROCNEMIUS RECESSION Right 08/07/2016   Procedure: GASTROCNEMIUS RECESSION;  Surgeon: Jerona LULLA Sage, MD;  Location: [facility];  Service: Orthopedics;  Laterality: Right;   Joint fusion-foot     x2 LEFT FOOT   KNEE ARTHROPLASTY  2001   Right   KNEE ARTHROPLASTY  2002   Left   LEFT HEART CATH AND CORONARY ANGIOGRAPHY N/A 06/18/2018   Procedure: LEFT HEART CATH AND CORONARY ANGIOGRAPHY;  Surgeon: Verlin Lonni BIRCH, MD;  Location: [facility];  Service: Cardiovascular;  Laterality: N/A;   Left leg vein resection     RETINAL DETACHMENT SURGERY Left 02/25/2022   done in [facility]   ROTATOR CUFF REPAIR     LEFT   THYROIDECTOMY  1980   3/4   TONSILLECTOMY     TUBAL LIGATION     WEIL OSTEOTOMY Right 08/07/2016   Procedure: WEIL OSTEOTOMY 2nd and 3rd Metatarsal Right Foot;  Surgeon: Jerona LULLA  Harden, MD;  Location: [facility];  Service: Orthopedics;  Laterality: Right;   WEIL OSTEOTOMY Left 12/25/2016   Procedure: 2nd Toe Proximal Interphalangeal Resection, Weil Osteotomy 2nd and 3rd Metatarsal Left Foot;  Surgeon: Jerona Harden GAILS, MD;  Location: [facility];  Service: Orthopedics;  Laterality: Left;    Home Medications:  Allergies as of 09/01/2024       Reactions   Ephedrine  Palpitations   Heart rate fast-   Hibiclens [chlorhexidine  Gluconate] Rash   SKIN REDNESS AND BURNING SENSATION    NO CHG   Keflex [cephalexin] Itching, Rash        Medication List        Accurate as of September 01, 2024  2:00 PM. If you have any questions, ask your nurse or doctor.          ascorbic acid 500 MG tablet Commonly known as: VITAMIN C Take 500 mg by mouth  daily.   aspirin  EC 81 MG tablet Take 81 mg by mouth at bedtime.   CoQ-10 100 MG Caps Take 1 capsule by mouth daily.   CRANBERRY EXTRACT PO Take by mouth. 2 daily by mouth   docusate sodium 100 MG capsule Commonly known as: COLACE Take 200 mg by mouth at bedtime.   DULoxetine 30 MG capsule Commonly known as: Cymbalta Take 1 capsule (30 mg total) by mouth daily.   famotidine  20 MG tablet Commonly known as: Pepcid  One after supper   furosemide 40 MG tablet Commonly known as: LASIX Take 40 mg by mouth daily.   gabapentin  100 MG capsule Commonly known as: Neurontin  Take 1 capsule (100 mg total) by mouth 4 (four) times daily.   levothyroxine  88 MCG tablet Commonly known as: SYNTHROID  Take 1 tablet (88 mcg total) by mouth daily.   loratadine 10 MG tablet Commonly known as: CLARITIN Take 10 mg by mouth daily.   losartan  100 MG tablet Commonly known as: COZAAR  Take 1 tablet (100 mg total) by mouth every evening.   meclizine 25 MG tablet Commonly known as: ANTIVERT Take 12.5 mg by mouth at bedtime as needed.   mesalamine  1.2 g EC tablet Commonly known as: LIALDA  Take 2 tablets (2.4 g total) by mouth daily with breakfast.   metoprolol succinate 100 MG 24 hr tablet Commonly known as: TOPROL-XL Take 100 mg by mouth every morning. Take with or immediately following a meal.   multivitamin tablet Take 1 tablet by mouth daily.   OXYGEN Inhale 2 L into the lungs at bedtime.   Ozempic (0.25 or 0.5 MG/DOSE) 2 MG/1.5ML Sopn Generic drug: Semaglutide(0.25 or 0.5MG /DOS) Inject 1 mL into the skin once a week.   pantoprazole  40 MG tablet Commonly known as: PROTONIX  Take 40 mg by mouth daily.   potassium chloride 10 MEQ tablet Commonly known as: KLOR-CON Take 10 mEq by mouth daily.   RABEprazole  20 MG tablet Commonly known as: ACIPHEX  Take 1 tablet (20 mg total) by mouth daily.   simvastatin 80 MG tablet Commonly known as: ZOCOR Take 1 tablet by mouth at  bedtime.   triamcinolone  cream 0.5 % Commonly known as: KENALOG  Apply 1 application topically 3 (three) times daily as needed. For rash/irritation.   Vitamin D (Cholecalciferol) 25 MCG (1000 UT) Caps Take 1,000 mg by mouth 2 (two) times daily.        Allergies:  Allergies  Allergen Reactions   Ephedrine  Palpitations    Heart rate fast-   Hibiclens [Chlorhexidine  Gluconate] Rash    SKIN REDNESS  AND BURNING SENSATION    NO CHG   Keflex [Cephalexin] Itching and Rash    Family History: Family History  Problem Relation Age of Onset   Hypertension Mother    Heart failure Father        Died with pneumonia   Hypertension Father    Arthritis Father    Diabetes Father    COPD Brother    Asthma Paternal Grandfather    Emphysema Paternal Grandfather    Colon cancer Neg Hx    Esophageal cancer Neg Hx    Stomach cancer Neg Hx    Colon polyps Neg Hx    Pancreatic cancer Neg Hx     Social History:  reports that she has never smoked. She has never been exposed to tobacco smoke. She has never used smokeless tobacco. She reports current alcohol  use. She reports that she does not use drugs.  ROS: All other review of systems were reviewed and are negative except what is noted above in HPI  Physical Exam: BP (!) 144/82   Pulse 67   Constitutional:  Alert and oriented, No acute distress. HEENT: [place] AT, moist mucus membranes.  Trachea midline, no masses. Cardiovascular: No clubbing, cyanosis, or edema. Respiratory: Normal respiratory effort, no increased work of breathing. GI: Abdomen is soft, nontender, nondistended, no abdominal masses GU: No CVA tenderness.  Lymph: No cervical or inguinal lymphadenopathy. Skin: No rashes, bruises or suspicious lesions. Neurologic: Grossly intact, no focal deficits, moving all 4 extremities. Psychiatric: Normal mood and affect.  Laboratory Data: Lab Results  Component Value Date   WBC 7.2 06/12/2018   HGB 14.4 06/12/2018   HCT 42.6  06/12/2018   MCV 90.1 06/12/2018   PLT 223 06/12/2018    Lab Results  Component Value Date   CREATININE 1.17 (H) 06/12/2018    No results found for: PSA  No results found for: TESTOSTERONE  Lab Results  Component Value Date   HGBA1C 6.6 (H) 12/20/2016    Urinalysis No results found for: COLORURINE, APPEARANCEUR, LABSPEC, PHURINE, GLUCOSEU, HGBUR, BILIRUBINUR, KETONESUR, PROTEINUR, UROBILINOGEN, NITRITE, LEUKOCYTESUR  No results found for: LABMICR, WBCUA, RBCUA, LABEPIT, MUCUS, BACTERIA  Pertinent Imaging:  No results found for this or any previous visit.  No results found for this or any previous visit.  No results found for this or any previous visit.  No results found for this or any previous visit.  No results found for this or any previous visit.  No results found for this or any previous visit.  No results found for this or any previous visit.  No results found for this or any previous visit.   Assessment & Plan:    1. Recurrent UTI (Primary) -We discussed the natural hx of recurrent UTIs and the various causes. We discussed the treatment options including post coital prophylaxis, daily prophylaxis, topical estrogen therapy.  - Urinalysis, Routine w reflex microscopic - BLADDER SCAN AMB NON-IMAGING  2. OAb We will trial gemtesa   No follow-ups on file.  Belvie Clara, MD  [facility] [place]

## 2024-09-01 NOTE — Patient Instructions (Signed)

## 2024-09-03 ENCOUNTER — Ambulatory Visit: Payer: Self-pay

## 2024-09-03 LAB — URINE CULTURE

## 2024-09-03 NOTE — Telephone Encounter (Signed)
 I have faxed the addended note 08/17/24 to [facility].

## 2024-09-07 ENCOUNTER — Ambulatory Visit: Admitting: Internal Medicine

## 2024-09-07 ENCOUNTER — Encounter: Payer: Self-pay | Admitting: Internal Medicine

## 2024-09-07 ENCOUNTER — Telehealth: Payer: Self-pay

## 2024-09-07 VITALS — BP 124/78 | HR 63 | Ht 64.0 in | Wt 238.0 lb

## 2024-09-07 DIAGNOSIS — K523 Indeterminate colitis: Secondary | ICD-10-CM

## 2024-09-07 DIAGNOSIS — Z8719 Personal history of other diseases of the digestive system: Secondary | ICD-10-CM

## 2024-09-07 DIAGNOSIS — K5900 Constipation, unspecified: Secondary | ICD-10-CM

## 2024-09-07 DIAGNOSIS — K219 Gastro-esophageal reflux disease without esophagitis: Secondary | ICD-10-CM

## 2024-09-07 MED ORDER — MESALAMINE 1.2 G PO TBEC: 2.4000 g | DELAYED_RELEASE_TABLET | Freq: Every day | ORAL | 1 refills | Status: AC

## 2024-09-07 NOTE — Telephone Encounter (Signed)
 Copied from CRM [id number]. Topic: General - Other >> Sep 07, 2024 10:13 AM Rilla NOVAK wrote: Reason for CRM: Patient calling to check the status of the request for surgery clearance.  She spoke with Dr Krystal Memo office and they state they have not received it.  Please call patient @ [phone]  FAX: [phone] - Gerkin's office  Looks like this was faxed 11/14. Routing to Surgical Clearance as they state they havent received clearance.

## 2024-09-07 NOTE — Telephone Encounter (Signed)
Fax was sent successfully

## 2024-09-07 NOTE — Telephone Encounter (Signed)
 08/17/24 ov note re faxed to the number provided. Line rings busy- will try again later.

## 2024-09-07 NOTE — Telephone Encounter (Signed)
 Return call to pt making her aware urine culture has been forward to Dr. Sherrilee for review and someone will reach out with MD recommendation. Pt voiced understanding

## 2024-09-07 NOTE — Patient Instructions (Addendum)
 We have sent the following medications to your pharmacy for you to pick up at your convenience:Lialda .    Continue Aciphex  and famotidine  at current dose.   Also, you can take Miralax as needed.   _______________________________________________________  If your blood pressure at your visit was 140/90 or greater, please contact your primary care physician to follow up on this.  _______________________________________________________  If you are age 77 or older, your body mass index should be between 23-30. Your Body mass index is 40.85 kg/m. If this is out of the aforementioned range listed, please consider follow up with your Primary Care Provider.  If you are age 28 or younger, your body mass index should be between 19-25. Your Body mass index is 40.85 kg/m. If this is out of the aformentioned range listed, please consider follow up with your Primary Care Provider.   ________________________________________________________  The [facility] providers would like to encourage you to use MYCHART to communicate with providers for non-urgent requests or questions.  Due to long hold times on the telephone, sending your provider a message by [facility] may be a faster and more efficient way to get a response.  Please allow 48 business hours for a response.  Please remember that this is for non-urgent requests.  _______________________________________________________  [facility] is using a team-based approach to care.  Your team is made up of your doctor and two to three APPS. Our APPS (Nurse Practitioners and Physician Assistants) work with your physician to ensure care continuity for you. They are fully qualified to address your health concerns and develop a treatment plan. They communicate directly with your gastroenterologist to care for you. Seeing the Advanced Practice Practitioners on your physician's team can help you by facilitating care more promptly, often allowing for earlier  appointments, access to diagnostic testing, procedures, and other specialty referrals.

## 2024-09-08 ENCOUNTER — Encounter: Payer: Self-pay | Admitting: Internal Medicine

## 2024-09-08 NOTE — Progress Notes (Signed)
   Subjective:    Patient ID: Grace Delacruz, female    DOB: 17-Jun-1946, 78 y.o.   MRN: [id number]  HPI Grace Delacruz is a 78 year old female with a history of remote ulcerative colitis, sleep apnea, hypothyroidism, diabetes, chronic lung disease on nocturnal oxygen who presents for follow-up after a recent colonoscopy.  She underwent a colonoscopy on June 08, 2024, for follow-up of her remote history of colitis and for screening purposes. The procedure revealed segmental mild inflammation in the proximal sigmoid and descending colon, severe diverticulosis in the sigmoid, and three polyps ranging from two to eight millimeters in size. Pathology results showed tubular adenoma and cyst ulcerative polyps, and the biopsy showed acute colitis.  She has experienced constipation prior to the colonoscopy, which has improved since starting Lialda . She is currently taking 2.4 grams of Lialda . Increased physical activity and hydration have also contributed to improvement.  She has been experiencing recurrent urinary tract infections (UTIs) and has recently visited a urologist for further evaluation. She notes an elevated white blood cell count in her blood tests and is awaiting further results from the urologist.  She is scheduled to have surgery to remove a portion of her thyroid  gland due to two growing nodules. She is currently waiting for the surgery to be scheduled and has been referred to a pulmonary doctor for clearance before the procedure.  She has a history of gastroesophageal reflux disease (GERD) and is currently taking Aciphex  and famotidine  at night. Her reflux symptoms have improved with this regimen.  She feels Aciphex  is working better than pantoprazole  previously.   Review of Systems As per HPI, otherwise negative  Current Medications, Allergies, Past Medical History, Past Surgical History, Family History and Social History were reviewed in [employer] record.     Objective:   Physical Exam BP 124/78   Pulse 63   Ht 5' 4 (1.626 m)   Wt 238 lb (108 kg)   BMI 40.85 kg/m  Gen: awake, alert, NAD HEENT: anicteric  Ext: no c/c/e Neuro: nonfocal   DIAGNOSTIC Colonoscopy: Segmental mild inflammation in the proximal sigmoid and descending colon, severe diverticulosis in the sigmoid, three polyps ranging from two to eight millimeters (06/08/2024)  PATHOLOGY Tubular adenoma, SSP, acute colitis (06/08/2024)      Assessment & Plan:  Segmental indeterminate colitis, active Active colitis in proximal sigmoid and descending colon. Biopsy confirmed acute colitis. Differential includes segmental Crohn's disease, but not labeled due to focal nature. Discussed fecal calprotectin test but deferred due to recent antibiotic use. - Continue Lialda  2.4 grams daily. - Consider fecal calprotectin test in future.  Severe sigmoid diverticulosis Causes restriction but not obstruction.  Current regular bowel habits.  Encourage fiber and liberal hydration  History of colonic polyps, post-polypectomy Three polyps removed, pathology showed tubular adenoma and SSP ulcerative polyps. - Continue surveillance colonoscopy every 3 years.  Constipation, improved Improved with Lialda . No current need for Miralax. - Use Miralax as needed, especially around surgery.  Gastroesophageal reflux disease, controlled Symptoms controlled with Aciphex  and famotidine . Pantoprazole  discontinued. - Continue Aciphex  20 mg in AM and famotidine  20 mg in PM.  24-month follow-up, sooner if needed  30 minutes total spent today including patient facing time, coordination of care, reviewing medical history/procedures/pertinent radiology studies, and documentation of the encounter.

## 2024-09-15 ENCOUNTER — Other Ambulatory Visit: Payer: Self-pay

## 2024-09-15 ENCOUNTER — Telehealth: Payer: Self-pay

## 2024-09-15 ENCOUNTER — Other Ambulatory Visit

## 2024-09-15 DIAGNOSIS — N390 Urinary tract infection, site not specified: Secondary | ICD-10-CM

## 2024-09-15 LAB — MICROSCOPIC EXAMINATION

## 2024-09-15 LAB — URINALYSIS, ROUTINE W REFLEX MICROSCOPIC
Bilirubin, UA: NEGATIVE
Glucose, UA: NEGATIVE
Ketones, UA: NEGATIVE
Nitrite, UA: NEGATIVE
Protein,UA: NEGATIVE
RBC, UA: NEGATIVE
Specific Gravity, UA: 1.015 (ref 1.005–1.030)
Urobilinogen, Ur: 0.2 mg/dL (ref 0.2–1.0)
pH, UA: 6.5 (ref 5.0–7.5)

## 2024-09-15 MED ORDER — NITROFURANTOIN MONOHYD MACRO 100 MG PO CAPS: 100.0000 mg | ORAL_CAPSULE | Freq: Two times a day (BID) | ORAL | 0 refills | Status: DC

## 2024-09-15 NOTE — Telephone Encounter (Signed)
 Dysuria  Patient called with c/o dysuria x 24 hours.  Pain: aching  Severity:5/10  Associated Signs and Symptoms:  Fever: no Chills: no Hematuria: no Urgency: yes Frequency: yes Hesitancy:yes Incontinence: yes Nausea: no Vomiting: no  Urologic History:  Any Recent Urologic Surgeries or Procedures:no Recurrent UTI's:yes Cystitis: yes  Prostatitis:no Kidney or Bladder Stones: no Plan: Walk-in Clinic: no Appointment w/Physician: [no Lab visit scheduled for urine drop off: Yes Advice given:  Do you take on daily medications for UTI suppression No

## 2024-09-15 NOTE — Telephone Encounter (Signed)
 Patient presents today with complaints of  Frequency .  UA and Culture done today.  Dr. Sherrilee reviewed results and Macrobid  100 mg BID x 7 days  .  Patient aware of MD recommendations and that we will reach out with culture results.      Dyjwpvlj, CMA

## 2024-09-20 LAB — URINE CULTURE

## 2024-09-21 ENCOUNTER — Ambulatory Visit: Payer: Self-pay

## 2024-09-22 ENCOUNTER — Telehealth: Payer: Self-pay

## 2024-09-22 NOTE — Telephone Encounter (Signed)
 Pt called to confirm the Rx that she was prescribed was sensitive to the bacteria that was found in her urine culture pt was advised that per her culture she was prescribed the correct Rx pt voiced her understanding stating if she continues to have symptoms after completing the course she will call back for another ua drop off

## 2024-09-28 ENCOUNTER — Ambulatory Visit: Attending: Cardiology | Admitting: Cardiology

## 2024-09-28 ENCOUNTER — Encounter: Payer: Self-pay | Admitting: Cardiology

## 2024-09-28 VITALS — BP 118/78 | HR 64 | Ht 64.0 in | Wt 238.0 lb

## 2024-09-28 DIAGNOSIS — I2510 Atherosclerotic heart disease of native coronary artery without angina pectoris: Secondary | ICD-10-CM

## 2024-09-28 DIAGNOSIS — I10 Essential (primary) hypertension: Secondary | ICD-10-CM

## 2024-09-28 DIAGNOSIS — Z01818 Encounter for other preprocedural examination: Secondary | ICD-10-CM

## 2024-09-28 DIAGNOSIS — I447 Left bundle-branch block, unspecified: Secondary | ICD-10-CM

## 2024-09-28 DIAGNOSIS — Z01810 Encounter for preprocedural cardiovascular examination: Secondary | ICD-10-CM

## 2024-09-28 NOTE — Patient Instructions (Addendum)

## 2024-09-28 NOTE — Progress Notes (Signed)
    Cardiology Office Note  Date: 09/28/2024   ID: Grace Delacruz, DOB 1946/08/26, MRN [id number]  History of Present Illness: Grace Delacruz is a 78 y.o. female last seen in June.  She is here for a routine visit.  Also scheduled for thyroidectomy under general anesthesia with Dr. Eletha in late December, I reviewed the chart.  From a cardiac perspective, she does not report any new exertional symptoms, no palpitations or syncope.  She continues to check blood pressure at home with good control.  We went over her medications which are stable from a cardiac perspective.  I reviewed her ECG today which shows sinus rhythm with left bundle branch block which is old.  RCRI perioperative cardiac risk index is 1 point, 1.1% chance of major adverse cardiac event.  Physical Exam: VS:  BP 118/78 (BP Location: Left Arm, Patient Position: Sitting, Cuff Size: Large)   Pulse 64   Ht 5' 4 (1.626 m)   Wt 238 lb (108 kg)   BMI 40.85 kg/m , BMI Body mass index is 40.85 kg/m.  Wt Readings from Last 3 Encounters:  09/28/24 238 lb (108 kg)  09/07/24 238 lb (108 kg)  08/18/24 238 lb 12.8 oz (108.3 kg)    General: Patient appears comfortable at rest. HEENT: Conjunctiva and lids normal. Neck: Supple, no elevated JVP or carotid bruits. Lungs: Clear to auscultation, nonlabored breathing at rest. Cardiac: Regular rate and rhythm, no S3, 1/6 systolic murmur. Extremities: No pitting edema.  ECG:  An ECG dated 03/25/2024 was personally reviewed today and demonstrated:  Sinus rhythm with left bundle branch block.  Labwork: 08/12/2024: TSH 0.700  October 2024: Hemoglobin A1c 5.8%, hemoglobin 13.1, platelets 223  Other Studies Reviewed Today:  No interval cardiac testing for review today.  Assessment and Plan:  1.  Preoperative cardiac assessment prior to planned thyroidectomy under general anesthesia with Dr. Eletha in late December.  She is symptomatically stable, no active angina or heart failure  symptoms, RCRI perioperative cardiac risk index is low as discussed above.  If necessary, she may hold aspirin  7 days prior to operation.  She will also need to hold her dose of Ozempic in the week prior to surgery.  She has her preanesthesia consultation later this week.  No further cardiac testing indicated at this time.  2.  Mild nonobstructive CAD by cardiac catheterization in 2019.  LVEF greater than 55% by follow-up echocardiogram at [facility] in August 2024.  She remains asymptomatic.  Continue aspirin  81 mg daily and Zocor 80 mg daily.  Also on Ozempic 2 mg weekly.   3.  History of PVCs and NSVT in the setting of normal LVEF.  No active palpitations or syncope.   4.  Primary hypertension.  Blood pressure well-controlled today.  Continue Cozaar  100 mg daily and Toprol-XL 100 mg daily.  5.  Left bundle branch block, chronic.  Disposition:  Follow up 6 months.  Signed, Jayson JUDITHANN Sierras, M.D., F.A.C.C. [facility]

## 2024-09-30 NOTE — Patient Instructions (Signed)
 SURGICAL WAITING ROOM VISITATION Patients having surgery or a procedure may have no more than 2 support people in the waiting area - these visitors may rotate in the visitor waiting room.   If the patient needs to stay at the hospital during part of their recovery, the visitor guidelines for inpatient rooms apply.  PRE-OP VISITATION  Pre-op nurse will coordinate an appropriate time for 1 support person to accompany the patient in pre-op.  This support person may not rotate.  This visitor will be contacted when the time is appropriate for the visitor to come back in the pre-op area.  Temporary Visitor Restrictions   Children ages 58 and under will not be able to visit patients in [facility] under most circumstances. Visitation is not restricted outside of hospitals unless noted otherwise in the [facility] and Location Specific Visitation Guidelines at :       [email or web address]  Visitors with respiratory illnesses are discouraged from visiting and should remain at home.  You are not required to quarantine at this time prior to your surgery. However, you must do this: Hand Hygiene often Do NOT share personal items Notify your provider if you are in close contact with someone who has COVID or you develop fever 100.4 or greater, new onset of sneezing, cough, sore throat, shortness of breath or body aches.  If you test positive for Covid or have been in contact with anyone that has tested positive in the last 10 days please notify you surgeon.    Your procedure is scheduled on:  Monday  10-11-2024  Report to [facility] Main Entrance: [place] entrance where the [employer] is available.   Report to admitting at:  05:15   AM  Call this number if you have any questions or problems the morning of surgery [phone]  FOLLOW ANY ADDITIONAL PRE OP INSTRUCTIONS YOU RECEIVED FROM YOUR SURGEON'S OFFICE!!!  Do not eat food after Midnight the night prior to your  surgery/procedure.  After Midnight you may have the following liquids until  04:30  AM  DAY OF SURGERY  Clear Liquid Diet Water  Black Coffee (sugar ok, NO MILK/CREAM OR CREAMERS)  Tea (sugar ok, NO MILK/CREAM OR CREAMERS) regular and decaf                             Plain Jell-O  with no fruit (NO RED)                                           Fruit ices (not with fruit pulp, NO RED)                                     Popsicles (NO RED)                                                                  Juice: NO CITRUS JUICES: only apple, WHITE grape, WHITE cranberry Sports drinks like Gatorade or Powerade (NO RED)  Oral Hygiene is also important to reduce your risk of infection.        Remember - BRUSH YOUR TEETH THE MORNING OF SURGERY WITH YOUR REGULAR TOOTHPASTE  Do NOT smoke after Midnight the night before surgery.  STOP TAKING all Vitamins, Herbs and supplements 1 week before your surgery.   OZEMPIC- stop taking 7-10 days before your surgery.   ASPIRIN - stop taking 7 days before your surgery. Last dose will be taken on Sunday 10-03-2024  mesalamine  (Lialda )- STOP taking 24 hours before surgery. Last dose will be taken on Saturday 10-09-24   Take ONLY these medicines the morning of surgery with A SIP OF WATER : Levothyroxine , Vibegron  (Gemtesa ), Famotidine , Metoprolol, Loratadine, Duloxetine  (Cymbalta ), Gabapentin , Tylenol   DO NOT TAKE Furosemide, Losartan , the day of your surgery.                    You may not have any metal on your body including hair pins, jewelry, and body piercing  Do not wear make-up, lotions, powders, perfumes or deodorant  Do not wear nail polish including gel and S&S, artificial / acrylic nails, or any other type of covering on natural nails including finger and toenails. If you have artificial nails, gel coating, etc., that needs to be removed by a nail salon, Please have this removed prior to surgery. Not doing so may mean that your  surgery could be cancelled or delayed if the Surgeon or anesthesia staff feels like they are unable to monitor you safely.   Do not shave 48 hours prior to surgery to avoid nicks in your skin which may contribute to postoperative infections.   Contacts, Hearing Aids, dentures or bridgework may not be worn into surgery. DENTURES WILL BE REMOVED PRIOR TO SURGERY PLEASE DO NOT APPLY Poly grip OR ADHESIVES!!!  You may bring a small overnight bag with you on the day of surgery, only pack items that are not valuable. [facility] IS NOT RESPONSIBLE   FOR VALUABLES THAT ARE LOST OR STOLEN.   Do not bring your home medications to the hospital. The Pharmacy will dispense medications listed on your medication list to you during your admission in the Hospital.  Special Instructions: Bring a copy of your healthcare power of attorney and living will documents the day of surgery, if you wish to have them scanned into your [facility] Records- EPIC  Please read over the following fact sheets you were given: IF YOU HAVE QUESTIONS ABOUT YOUR PRE-OP INSTRUCTIONS, PLEASE CALL [phone]   SINCE YOU HAVE A CHG (chlorahexidine gluconate) ALLERGY Please follow the following instructions:   [place] - Preparing for Surgery Before surgery, you can play an important role.  Because skin is not sterile, your skin needs to be as free of germs as possible.  You can reduce the number of germs on your skin by washing with Antibacterial soap before surgery.  . Do not shave (including legs and underarms) for at least 48 hours prior to the first shower.  You may shave your face/neck.  Please follow these instructions carefully:  1.  Shower with antibacterial Soap the night before surgery and the  morning of surgery.  2.  If you choose to wash your hair, wash your hair first as usual with your normal  shampoo.  3.  After you shampoo, rinse your hair and body thoroughly to remove the shampoo.  4.  You can apply soap directly to the skin and wash.  Gently with a scrungie or clean washcloth.  5.  Wash face,  Genitals (private parts) with your normal soap.             6.  Wash thoroughly, paying special attention to the area where your  surgery  will be performed.  7.  Thoroughly rinse your body with warm water  from the neck down.  8.   Pat yourself dry with a clean towel.             9  Wear clean pajamas.            10 Place clean sheets on your bed the night of your first shower and do not  sleep with pets.  ON THE DAY OF SURGERY : Do not apply any lotions/deodorants the morning of surgery.  Please wear clean clothes to the hospital/surgery center.  FAILURE TO FOLLOW THESE INSTRUCTIONS MAY RESULT IN THE CANCELLATION OF YOUR SURGERY  PATIENT SIGNATURE_________________________________  NURSE SIGNATURE__________________________________

## 2024-09-30 NOTE — Progress Notes (Incomplete)
 COVID Vaccine received:  []  No [x]  Yes Date of any COVID positive Test in last 90 days: None  PCP - Vaughn Pouch, MD  Cardiologist - Jayson Sierras, MD  cardiac clearance in 09-28-24 note, Pulmonolgy- Reggy Salt, MD clearance in 08-17-2024 note  Chest x-ray - 08-27-2023  2v Epic, CT chest 03-11-24 in [facility] EKG -  09-28-2024 Epic Stress Test - 05-06-2018  Epic ECHO - 05-20-2018 Epic Cardiac Cath - 06-18-2018 [facility] / [facility] by Dr. Verlin CT Coronary Calcium score:   Pacemaker / ICD device [x]  No []  Yes   Spinal Cord Stimulator:[x]  No []  Yes       History of Sleep Apnea? []  No [x]  Yes  mild CPAP used?- [x]  No []  Yes     only 2L of O2 per [place]  Medication on DOS: Levothyroxine , Vibegron  (Gemtesa ), Famotidine , Metoprolol, Loratadine, Duloxetine  (Cymbalta ), Gabapentin , Tylenol   Hold DOS:mesalamine  (Lialda ) hold 24 hrs, Furosemide, Losartan ,   Patient has: []  NO Hx DM   [x]  Pre-DM   []  DM1  []   DM2 Does the patient monitor blood sugar?   []  N/A   [x]  No []  Yes  Last A1c was:  5.8 on   07-23-2023    OZEMPIC inj-  inject on Sunday  Last dose 09-26-24  Blood Thinner / Instructions: Aspirin  Instructions:  ASA 81 mg - hold x7 days  Activity level: Able to walk up 2 flights of stairs without becoming significantly short of breath or having chest pain?  [x]  No  would have SOB No CP []    Yes  Patient can perform ADLs without assistance. []  No   [x]   Yes  Anesthesia review: HTN, NSVT- PVCs, chronic LBBB, GERD, mild OSA-no CPAP- uses O2, MDD,  PONV, vertigo  Patient denies any S&S of respiratory illness or Covid - no shortness of breath, fever, cough or chest pain at PAT appointment.  Patient verbalized understanding and agreement to the Pre-Surgical Instructions that were given to them at this PAT appointment. Patient was also educated of the need to review these PAT instructions again prior to her surgery.I reviewed the appropriate phone numbers to call if they have any and questions or concerns.

## 2024-10-01 ENCOUNTER — Encounter (HOSPITAL_COMMUNITY): Admission: RE | Admit: 2024-10-01 | Discharge: 2024-10-01 | Attending: Surgery | Admitting: Surgery

## 2024-10-01 ENCOUNTER — Encounter (HOSPITAL_COMMUNITY): Payer: Self-pay

## 2024-10-01 ENCOUNTER — Other Ambulatory Visit: Payer: Self-pay

## 2024-10-01 VITALS — BP 152/60 | HR 60 | Temp 97.8°F | Resp 20 | Ht 64.0 in | Wt 235.9 lb

## 2024-10-01 DIAGNOSIS — I10 Essential (primary) hypertension: Secondary | ICD-10-CM

## 2024-10-01 DIAGNOSIS — Z01818 Encounter for other preprocedural examination: Secondary | ICD-10-CM

## 2024-10-01 LAB — CBC
HCT: 43 % (ref 36.0–46.0)
Hemoglobin: 14.2 g/dL (ref 12.0–15.0)
MCH: 30.8 pg (ref 26.0–34.0)
MCHC: 33 g/dL (ref 30.0–36.0)
MCV: 93.3 fL (ref 80.0–100.0)
Platelets: 216 K/uL (ref 150–400)
RBC: 4.61 MIL/uL (ref 3.87–5.11)
RDW: 12.9 % (ref 11.5–15.5)
WBC: 8.7 K/uL (ref 4.0–10.5)
nRBC: 0 % (ref 0.0–0.2)

## 2024-10-01 LAB — BASIC METABOLIC PANEL WITH GFR
Anion gap: 10 (ref 5–15)
BUN: 19 mg/dL (ref 8–23)
CO2: 31 mmol/L (ref 22–32)
Calcium: 9.9 mg/dL (ref 8.9–10.3)
Chloride: 103 mmol/L (ref 98–111)
Creatinine, Ser: 1.07 mg/dL — ABNORMAL HIGH (ref 0.44–1.00)
GFR, Estimated: 53 mL/min — ABNORMAL LOW (ref >60)
Glucose, Bld: 141 mg/dL — ABNORMAL HIGH (ref 70–99)
Potassium: 3.6 mmol/L (ref 3.5–5.1)
Sodium: 145 mmol/L (ref 135–145)

## 2024-10-05 NOTE — Anesthesia Preprocedure Evaluation (Signed)
 Anesthesia Evaluation    Airway        Dental   Pulmonary           Cardiovascular hypertension,      Neuro/Psych    GI/Hepatic   Endo/Other  diabetes    Renal/GU      Musculoskeletal   Abdominal   Peds  Hematology   Anesthesia Other Findings   Reproductive/Obstetrics                              Anesthesia Physical Anesthesia Plan  ASA:   Anesthesia Plan:    Post-op Pain Management:    Induction:   PONV Risk Score and Plan:   Airway Management Planned:   Additional Equipment:   Intra-op Plan:   Post-operative Plan:   Informed Consent:   Plan Discussed with:   Anesthesia Plan Comments: (PAT note by Lynwood Hope, PA-C: 78 year old female follows with cardiology for history of mild nonobstructive CAD by cath 2019, PVCs, NSVT, HTN, chronic left bundle branch block.  Echo 05/2023 with LVEF >55%, normal RV.  Seen by Dr. Debera 09/28/24 for preop eval. Per note, Preoperative cardiac assessment prior to planned thyroidectomy under general anesthesia with Dr. Eletha in late December. She is symptomatically stable, no active angina or heart failure symptoms, RCRI perioperative cardiac risk index is low as discussed above. If necessary, she may hold aspirin  7 days prior to operation. She will also need to hold her dose of Ozempic in the week prior to surgery. She has her preanesthesia consultation later this week. No further cardiac testing indicated at this time.  Follows with pulmonology for history of OSA not on CPAP, nocturnal hypoxemia on supplemental O2, OHS, chronic cough.  Seen by Dr. Jan 08/09/2024 noted to be stable at that time from pulmonary standpoint.  Upcoming surgery discussed.  Per note, ADDENDUM - 09/01/24- Stable and clear from Pulmonary standpoint for necessary thyroid  surgery/ GOT, recognizing need for O2 supplementation and monitoring when asleep and in Recovery room.  Mild obstructive sleep apnea. Pulmonary consultation available at time of surgery if needed.  Other pertinent history includes PONV, prediabetes (A1c 5.8 07/23/2023), anxiety, GERD on H2 blocker, hypothyroidism, class III obesity BMI 40.  Patient reports last dose of Ozempic 09/26/2024.  Preop labs reviewed, unremarkable.  EKG 09/28/2024: NSR.  Rate 64.  Left bundle branch block.  TTE 06/09/2023: 1. The left ventricle is normal in size with mildly increased wall thickness. 2. The left ventricular systolic function is normal, LVEF is visually estimated at > 55%. 3. Mitral annular calcification is present. 4. The aortic valve is trileaflet with mildly thickened leaflets with normal excursion. 5. The left atrium is mildly dilated in size. 6. The right ventricle is normal in size, with normal systolic function.   Cath 06/18/2018:  Mid RCA lesion is 20% stenosed.  Prox LAD to Mid LAD lesion is 30% stenosed.  Ost 1st Diag lesion is 20% stenosed.   1. Mild non-obstructive CAD 2. Mild elevation LVEDP   Recommendations: Medical management of CAD.    )         Anesthesia Quick Evaluation

## 2024-10-05 NOTE — Progress Notes (Signed)
 Anesthesia Chart Review:  78 year old female follows with cardiology for history of mild nonobstructive CAD by cath 2019, PVCs, NSVT, HTN, chronic left bundle branch block.  Echo 05/2023 with LVEF >55%, normal RV.  Seen by Dr. Debera 09/28/24 for preop eval. Per note, Preoperative cardiac assessment prior to planned thyroidectomy under general anesthesia with Dr. Eletha in late December. She is symptomatically stable, no active angina or heart failure symptoms, RCRI perioperative cardiac risk index is low as discussed above. If necessary, she may hold aspirin  7 days prior to operation. She will also need to hold her dose of Ozempic in the week prior to surgery. She has her preanesthesia consultation later this week. No further cardiac testing indicated at this time.  Follows with pulmonology for history of OSA not on CPAP, nocturnal hypoxemia on supplemental O2, OHS, chronic cough.  Seen by Dr. Jan 08/09/2024 noted to be stable at that time from pulmonary standpoint.  Upcoming surgery discussed.  Per note, ADDENDUM - 09/01/24- Stable and clear from Pulmonary standpoint for necessary thyroid  surgery/ GOT, recognizing need for O2 supplementation and monitoring when asleep and in Recovery room. Mild obstructive sleep apnea. Pulmonary consultation available at time of surgery if needed.  Other pertinent history includes PONV, prediabetes (A1c 5.8 07/23/2023), anxiety, GERD on H2 blocker, hypothyroidism, class III obesity BMI 40.  Patient reports last dose of Ozempic 09/26/2024.  Preop labs reviewed, unremarkable.  EKG 09/28/2024: NSR.  Rate 64.  Left bundle branch block.  TTE 06/09/2023: 1. The left ventricle is normal in size with mildly increased wall thickness. 2. The left ventricular systolic function is normal, LVEF is visually estimated at > 55%. 3. Mitral annular calcification is present. 4. The aortic valve is trileaflet with mildly thickened leaflets with normal excursion. 5. The left  atrium is mildly dilated in size. 6. The right ventricle is normal in size, with normal systolic function.   Cath 06/18/2018: Mid RCA lesion is 20% stenosed. Prox LAD to Mid LAD lesion is 30% stenosed. Ost 1st Diag lesion is 20% stenosed.   1. Mild non-obstructive CAD 2. Mild elevation LVEDP   Recommendations: Medical management of CAD.      Lynwood Geofm RIGGERS [facility] Short Stay Center/Anesthesiology Phone [phone] 10/05/2024 10:27 AM

## 2024-10-08 ENCOUNTER — Encounter (HOSPITAL_COMMUNITY): Payer: Self-pay | Admitting: Surgery

## 2024-10-08 DIAGNOSIS — E041 Nontoxic single thyroid nodule: Secondary | ICD-10-CM | POA: Diagnosis present

## 2024-10-08 NOTE — H&P (Signed)
 "    PROVIDER: Lindaann Gradilla Grace SPINNER, MD   Chief Complaint: Follow-up (Left thyroid  nodule)  History of Present Illness:  Patient returns to my practice for follow-up of left sided thyroid  nodule. Patient had undergone a right thyroid  lobectomy approximately 45 years ago by Dr. Lamar Beck. Patient has developed a left sided thyroid  nodule. This was evaluated last year with an ultrasound and a fine-needle aspiration biopsy. The nodule measured 3.3 cm and fine-needle aspiration cytology was benign. A new ultrasound study was obtained on July 29, 2024. This shows enlargement of the dominant nodule in the left lobe, now measuring 4.0 cm in greatest dimension. There is also a new nodule in the left lobe measuring 2.0 cm. Patient is on levothyroxine  88 mcg daily. Her recent TSH level is normal at 0.70. Patient does note new onset of hoarseness. She has mild dysphagia. She has nighttime hypoxia and wears oxygen at night. She is followed by pulmonary medicine, Dr. Quita Salt. Patient presents today to review the results of her recent ultrasound and to discuss possible surgical management. She is accompanied by her husband.   Review of Systems: A complete review of systems was obtained from the patient. I have reviewed this information and discussed as appropriate with the patient. See HPI as well for other ROS.  Review of Systems  Constitutional: Negative.  HENT:  Hoarseness  Eyes: Negative.  Respiratory: Negative.  Cardiovascular: Negative.  Gastrointestinal:  Dysphagia  Genitourinary: Negative.  Musculoskeletal: Negative.  Skin: Negative.  Neurological: Negative.  Endo/Heme/Allergies: Negative.  Psychiatric/Behavioral: Negative.    Medical History: Past Medical History:  Diagnosis Date  Allergy  Diabetes mellitus type 2, uncomplicated (CMS/HHS-HCC)  GERD (gastroesophageal reflux disease)  History of blood transfusion  Hypertension  Hypothyroidism  Osteoarthritis  Thyroid   disease  Vertigo  Vision abnormalities   Patient Active Problem List  Diagnosis  Scoliosis  Adolescent scoliosis  Morbid obesity (CMS-HCC)  BPPV (benign paroxysmal positional vertigo), left  Type 2 diabetes mellitus (CMS/HHS-HCC)  Snoring  Shoulder pain  Presence of retained hardware  Polyneuropathy  Pain of right forearm  Pain in right hand  Other peripheral vertigo, unspecified ear  Numbness  Neck pain  Left bundle branch block  Gait disturbance  Fused toes of left foot  Essential hypertension, benign  Cervicogenic headache  Abnormal chest x-ray  Abnormal myocardial perfusion study  Acquired claw toe, left  Acquired contracture of Achilles tendon, right  Right wrist pain  Epiretinal membrane (ERM) of both eyes  History of repair of retinal tear by laser photocoagulation  Vitreomacular traction syndrome of left eye  Diabetes mellitus type 2 without retinopathy (CMS/HHS-HCC)  H/O vitrectomy  Cystoid macular edema of left eye  Left thyroid  nodule  Hoarseness of voice   Past Surgical History:  Procedure Laterality Date  VITRECTOMY W/REMOVAL INTERNAL LIMITING MEMBRANE RETINA BY PARS PLANA Left 02/25/2022  Procedure: VITRECTOMY; 25G, MECHANICAL, PARS PLANA APPROACH; WITH REMOVAL OF INTERNAL LIMITING MEMBRANE OF RETINA, INCLUDES, IF PERFORMED, INTRAOCULAR TAMPONADE (IE, AIR,GAS OR SILICONE OIL); Surgeon: Dale Mems, MD; [facility]; Service: Ophthalmology; Laterality: Left;  BREAST EXCISIONAL BIOPSY  BREAST SURGERY  CESAREAN SECTION  CHOLECYSTECTOMY OPEN  CHOLECYSTECTOMY OPEN  CORNEAL EYE SURGERY  States it has been at least 10 yrs  CORONARY ANGIOPLASTY  EXPLORATORY LAPAROTOMY  EYE SURGERY  JOINT REPLACEMENT  KNEE ARTHROSCOPY  SINUS SURGERY  THYROID  SURGERY  THYROIDECTOMY FOR REMOVAL REMAINING TISSUE  TONSILLECTOMY    Allergies  Allergen Reactions  Chlorhexidine  Gluconate Other (See Comments)  SKIN REDNESS AND BURNING SENSATION  Ephedrine   Other (See Comments)  Heart rate fast-  Cephalexin Itching and Rash   Current Outpatient Medications on File Prior to Visit  Medication Sig Dispense Refill  ascorbic acid, vitamin C, 500 mg Chew Take 500 mg by mouth once daily  aspirin  81 MG EC tablet Take 81 mg by mouth at bedtime  CALCIUM ORAL Take by mouth once daily  cholecalciferol (VITAMIN D3) 1,000 unit capsule Take 1,000 Units by mouth 2 (two) times daily  docusate (COLACE) 100 MG capsule Take 100 mg by mouth 2 (two) times daily  FLUoxetine (PROZAC) 20 MG capsule Take 20 mg by mouth once daily  FUROsemide  (LASIX ) 40 MG tablet Take 40 mg by mouth daily.  loratadine (CLARITIN) 10 mg tablet Take 10 mg by mouth once daily  losartan  (COZAAR ) 100 MG tablet Take 100 mg by mouth at bedtime  meclizine (ANTIVERT) 25 mg tablet Take 25 mg by mouth 3 (three) times daily as needed  metoprolol  succinate (TOPROL -XL) 100 MG XL tablet Take 100 mg by mouth once daily  multivitamin tablet Take 1 tablet by mouth once daily  simvastatin (ZOCOR) 80 MG tablet Take 80 mg by mouth at bedtime  amLODIPine  (NORVASC ) 10 MG tablet Take by mouth once daily (Patient not taking: Reported on 08/26/2024)  atropine 1 % ophthalmic solution Place 1 drop into the left eye 2 (two) times daily Can substitute with cyclopentolate or homatropine if not available (Patient not taking: Reported on 05/30/2022) 2 mL 1  bisacodyl (DULCOLAX) 5 mg EC tablet Take 5 mg by mouth once daily as needed for Constipation (Patient not taking: Reported on 08/26/2024)  gabapentin  (NEURONTIN ) 300 MG capsule Take 300 mg by mouth 2 (two) times daily (Patient not taking: Reported on 08/26/2024)  ibuprofen (ADVIL,MOTRIN) 200 MG tablet Take 400 mg by mouth daily. (Patient not taking: Reported on 08/26/2024)  imipramine  (TOFRANIL ) 10 MG tablet Take 1 tablet by mouth once daily (Patient not taking: Reported on 08/26/2024)  ketorolac tromethamine (KETOROLAC OPHTH) Place 1 drop into the left eye 2 (two) times  daily (Patient not taking: Reported on 02/21/2022)  krill-om3-dha-epa-om6-lip-astx 1000-130(40-80) mg Cap Take by mouth once daily (Patient not taking: Reported on 08/26/2024)  levothyroxine  (SYNTHROID , LEVOTHROID) 100 MCG tablet Take 100 mcg by mouth once daily (Patient not taking: Reported on 08/26/2024)  metFORMIN (GLUCOPHAGE-XR) 500 MG XR tablet Take 500 mg by mouth once daily  ofloxacin (OCUFLOX) 0.3 % ophthalmic solution Place 1 drop into the left eye 4 (four) times daily (Patient not taking: Reported on 05/30/2022) 15 mL 1  omeprazole (PRILOSEC) 20 MG DR capsule Take 20 mg by mouth once daily (Patient not taking: Reported on 08/26/2024)  semaglutide (OZEMPIC) 0.25 mg or 0.5 mg(2 mg/1.5 mL) pen injector Inject 0.25 mg subcutaneously once a week Sundays (Patient not taking: Reported on 08/26/2024)  spironolactone (ALDACTONE) 25 MG tablet Take 25 mg by mouth once daily (Patient not taking: Reported on 08/26/2024)  sulfamethoxazole/trimethoprim (BACTRIM ORAL) Take by mouth  triamcinolone  0.5 % cream Apply topically 2 (two) times daily (Patient not taking: Reported on 08/26/2024)  vit C/vit E ac/lut/copper/zinc (PRESERVISION LUTEIN ORAL) Take by mouth 2 (two) times daily (Patient not taking: Reported on 08/26/2024)   No current facility-administered medications on file prior to visit.   Family History  Problem Relation Age of Onset  Anesthesia problems Mother  Hearing loss Mother  Heart disease Father  Diabetes Father  Hearing loss Father  Thyroid  disease Father  Retinal degeneration Father  Asthma Maternal Grandfather  Heart disease Maternal Grandfather  Asthma Paternal Grandfather  Osteoarthritis Maternal Aunt  Osteoarthritis Maternal Uncle  Osteoarthritis Paternal Aunt  Osteoarthritis Paternal Uncle  Osteoarthritis Cousin  Rheum arthritis Cousin  Macular degeneration Neg Hx  Glaucoma Neg Hx    Social History   Tobacco Use  Smoking Status Never  Smokeless Tobacco Never    Social  History   Socioeconomic History  Marital status: Married  Tobacco Use  Smoking status: Never  Smokeless tobacco: Never  Vaping Use  Vaping status: Never Used  Substance and Sexual Activity  Alcohol  use: Yes  Comment: rarely  Drug use: No  Sexual activity: Defer   Social Drivers of Health   Financial Resource Strain: Low Risk (06/10/2023)  Received from [facility]  Overall Financial Resource Strain (CARDIA)  Difficulty of Paying Living Expenses: Not hard at all  Food Insecurity: No Food Insecurity (06/10/2023)  Received from [facility]  Hunger Vital Sign  Within the past 12 months, you worried that your food would run out before you got the money to buy more.: Never true  Within the past 12 months, the food you bought just didn't last and you didn't have money to get more.: Never true  Transportation Needs: No Transportation Needs (06/10/2023)  Received from [facility]  Lack of Transportation (Medical): No  Lack of Transportation (Non-Medical): No  Housing Stability: Unknown (08/26/2024)  Housing Stability Vital Sign  Homeless in the Last Year: No   Objective:   Vitals:  BP: (!) 144/80  Pulse: 71  Temp: 36.7 C (98 F)  SpO2: 98%  Weight: (!) 107.8 kg (237 lb 9.6 oz)  PainSc: 0-No pain   Body mass index is 40.78 kg/m.  Physical Exam   GENERAL APPEARANCE Comfortable, no acute issues Development: normal Gross deformities: none  SKIN Rash, lesions, ulcers: none Induration, erythema: none Nodules: none palpable  EYES Conjunctiva and lids: normal Pupils: equal  EARS, NOSE, MOUTH, THROAT External ears: no lesion or deformity External nose: no lesion or deformity Hearing: grossly normal  NECK Symmetric: no Trachea: midline Thyroid : Right thyroid  lobe is without palpable abnormality. Left side is relatively full with a dominant mass extending beneath the left clavicle which is relatively firm but mobile with  swallowing. There is no tenderness. There is no associated lymphadenopathy.  CHEST/CV Not assessed  ABDOMEN Not assessed  GENITOURINARY/RECTAL Not assessed  MUSCULOSKELETAL Station and gait: normal Digits and nails: no clubbing or cyanosis Muscle strength: grossly normal all extremities Deformity: none  LYMPHATIC Cervical: none palpable Supraclavicular: none palpable  PSYCHIATRIC Oriented to person, place, and time: yes Mood and affect: normal for situation Judgment and insight: appropriate for situation   Assessment and Plan:   Left thyroid  nodule  Patient presents with enlarging left thyroid  nodule. There is also been an interval development of a 2 cm thyroid  nodule in the left lobe. Right thyroid  lobe is surgically absent. Patient does have some mild compressive symptoms including development of mild hoarseness.  Today we discussed options for management. We discussed continued observation with repeat ultrasound exam in 6 months. We discussed proceeding with completion thyroidectomy with removal of the left thyroid  lobe. We discussed risks and benefits of surgery including the risk of recurrent nerve injury and injury to parathyroid glands. We discussed the size and location of the surgical incision. We discussed the hospital stay to be anticipated. We discussed her postoperative recovery and return to activities. We discussed the  need for ongoing lifelong thyroid  hormone replacement. The patient understands.  We will request clearance from her pulmonary medicine physician, Dr. Quita Salt.   Grace Spinner, MD [facility] practice Office: [phone]   "

## 2024-10-09 ENCOUNTER — Encounter (HOSPITAL_COMMUNITY): Payer: Self-pay | Admitting: Surgery

## 2024-10-11 ENCOUNTER — Encounter (HOSPITAL_COMMUNITY): Payer: Self-pay | Admitting: Surgery

## 2024-10-11 ENCOUNTER — Encounter (HOSPITAL_COMMUNITY): Admission: RE | Disposition: A | Payer: Self-pay | Source: Ambulatory Visit | Attending: Surgery

## 2024-10-11 ENCOUNTER — Ambulatory Visit (HOSPITAL_COMMUNITY)

## 2024-10-11 ENCOUNTER — Encounter (HOSPITAL_COMMUNITY): Payer: Self-pay | Admitting: Medical

## 2024-10-11 ENCOUNTER — Other Ambulatory Visit: Payer: Self-pay

## 2024-10-11 ENCOUNTER — Ambulatory Visit (HOSPITAL_COMMUNITY)
Admission: RE | Admit: 2024-10-11 | Discharge: 2024-10-12 | Disposition: A | Discharge status: Home / Self Care | Source: Ambulatory Visit | Attending: Surgery | Admitting: Surgery

## 2024-10-11 DIAGNOSIS — R1310 Dysphagia, unspecified: Secondary | ICD-10-CM | POA: Diagnosis present

## 2024-10-11 DIAGNOSIS — F418 Other specified anxiety disorders: Secondary | ICD-10-CM | POA: Diagnosis not present

## 2024-10-11 DIAGNOSIS — Z8249 Family history of ischemic heart disease and other diseases of the circulatory system: Secondary | ICD-10-CM | POA: Insufficient documentation

## 2024-10-11 DIAGNOSIS — Z8349 Family history of other endocrine, nutritional and metabolic diseases: Secondary | ICD-10-CM | POA: Insufficient documentation

## 2024-10-11 DIAGNOSIS — Z79890 Hormone replacement therapy (postmenopausal): Secondary | ICD-10-CM | POA: Insufficient documentation

## 2024-10-11 DIAGNOSIS — E119 Type 2 diabetes mellitus without complications: Secondary | ICD-10-CM

## 2024-10-11 DIAGNOSIS — I2510 Atherosclerotic heart disease of native coronary artery without angina pectoris: Secondary | ICD-10-CM | POA: Insufficient documentation

## 2024-10-11 DIAGNOSIS — G4733 Obstructive sleep apnea (adult) (pediatric): Secondary | ICD-10-CM | POA: Diagnosis not present

## 2024-10-11 DIAGNOSIS — I447 Left bundle-branch block, unspecified: Secondary | ICD-10-CM | POA: Insufficient documentation

## 2024-10-11 DIAGNOSIS — Z7985 Long-term (current) use of injectable non-insulin antidiabetic drugs: Secondary | ICD-10-CM | POA: Insufficient documentation

## 2024-10-11 DIAGNOSIS — I10 Essential (primary) hypertension: Secondary | ICD-10-CM | POA: Insufficient documentation

## 2024-10-11 DIAGNOSIS — K219 Gastro-esophageal reflux disease without esophagitis: Secondary | ICD-10-CM | POA: Insufficient documentation

## 2024-10-11 DIAGNOSIS — Z7984 Long term (current) use of oral hypoglycemic drugs: Secondary | ICD-10-CM | POA: Diagnosis not present

## 2024-10-11 DIAGNOSIS — Z9981 Dependence on supplemental oxygen: Secondary | ICD-10-CM | POA: Insufficient documentation

## 2024-10-11 DIAGNOSIS — E042 Nontoxic multinodular goiter: Secondary | ICD-10-CM | POA: Diagnosis not present

## 2024-10-11 DIAGNOSIS — Z833 Family history of diabetes mellitus: Secondary | ICD-10-CM | POA: Insufficient documentation

## 2024-10-11 DIAGNOSIS — E041 Nontoxic single thyroid nodule: Secondary | ICD-10-CM | POA: Diagnosis present

## 2024-10-11 DIAGNOSIS — N289 Disorder of kidney and ureter, unspecified: Secondary | ICD-10-CM | POA: Insufficient documentation

## 2024-10-11 DIAGNOSIS — E039 Hypothyroidism, unspecified: Secondary | ICD-10-CM | POA: Insufficient documentation

## 2024-10-11 DIAGNOSIS — G4734 Idiopathic sleep related nonobstructive alveolar hypoventilation: Secondary | ICD-10-CM | POA: Insufficient documentation

## 2024-10-11 DIAGNOSIS — E66813 Obesity, class 3: Secondary | ICD-10-CM | POA: Insufficient documentation

## 2024-10-11 DIAGNOSIS — I471 Supraventricular tachycardia, unspecified: Secondary | ICD-10-CM | POA: Insufficient documentation

## 2024-10-11 DIAGNOSIS — Z79899 Other long term (current) drug therapy: Secondary | ICD-10-CM | POA: Insufficient documentation

## 2024-10-11 DIAGNOSIS — Z6841 Body Mass Index (BMI) 40.0 and over, adult: Secondary | ICD-10-CM | POA: Diagnosis not present

## 2024-10-11 HISTORY — PX: THYROIDECTOMY, COMPLETION: SHX7647

## 2024-10-11 LAB — GLUCOSE, CAPILLARY
Glucose-Capillary: 172 mg/dL — ABNORMAL HIGH (ref 70–99)
Glucose-Capillary: 173 mg/dL — ABNORMAL HIGH (ref 70–99)

## 2024-10-11 SURGERY — THYROIDECTOMY, COMPLETION
Anesthesia: General | Site: Neck | Laterality: Left

## 2024-10-11 MED ORDER — AMISULPRIDE (ANTIEMETIC) 5 MG/2ML IV SOLN: 10.0000 mg | Freq: Once | INTRAVENOUS | Status: DC | PRN

## 2024-10-11 MED ORDER — OXYCODONE HCL 5 MG/5ML PO SOLN: 5.0000 mg | Freq: Once | ORAL | Status: DC | PRN

## 2024-10-11 MED ORDER — FENTANYL CITRATE (PF) 100 MCG/2ML IJ SOLN
INTRAMUSCULAR | Status: DC | PRN
  Administered 2024-10-11 (×3): 50 ug via INTRAVENOUS

## 2024-10-11 MED ORDER — ACETAMINOPHEN 650 MG RE SUPP: 650.0000 mg | Freq: Four times a day (QID) | RECTAL | Status: DC | PRN

## 2024-10-11 MED ORDER — SODIUM CHLORIDE 0.45 % IV SOLN: INTRAVENOUS | Status: DC

## 2024-10-11 MED ORDER — PHENYLEPHRINE 80 MCG/ML (10ML) SYRINGE FOR IV PUSH (FOR BLOOD PRESSURE SUPPORT)
PREFILLED_SYRINGE | INTRAVENOUS | Status: DC | PRN
  Administered 2024-10-11: 40 ug via INTRAVENOUS
  Administered 2024-10-11: 80 ug via INTRAVENOUS

## 2024-10-11 MED ORDER — MESALAMINE 1.2 G PO TBEC
2.4000 g | DELAYED_RELEASE_TABLET | Freq: Every day | ORAL | Status: DC
  Administered 2024-10-12: 2.4 g via ORAL
  Filled 2024-10-11: qty 2

## 2024-10-11 MED ORDER — FENTANYL CITRATE (PF) 50 MCG/ML IJ SOSY
25.0000 ug | PREFILLED_SYRINGE | INTRAMUSCULAR | Status: DC | PRN
  Administered 2024-10-11 (×2): 50 ug via INTRAVENOUS

## 2024-10-11 MED ORDER — INSULIN ASPART 100 UNIT/ML IJ SOLN: 0.0000 [IU] | INTRAMUSCULAR | Status: DC | PRN

## 2024-10-11 MED ORDER — ORAL CARE MOUTH RINSE: 15.0000 mL | Freq: Once | OROMUCOSAL | Status: DC

## 2024-10-11 MED ORDER — CHLORHEXIDINE GLUCONATE CLOTH 2 % EX PADS: 6.0000 | MEDICATED_PAD | Freq: Once | CUTANEOUS | Status: DC

## 2024-10-11 MED ORDER — ONDANSETRON HCL 4 MG/2ML IJ SOLN
INTRAMUSCULAR | Status: AC
  Filled 2024-10-11: qty 2

## 2024-10-11 MED ORDER — ACETAMINOPHEN 10 MG/ML IV SOLN: 1000.0000 mg | Freq: Once | INTRAVENOUS | Status: DC | PRN

## 2024-10-11 MED ORDER — ROCURONIUM BROMIDE 10 MG/ML (PF) SYRINGE
PREFILLED_SYRINGE | INTRAVENOUS | Status: DC | PRN
  Administered 2024-10-11: 10 mg via INTRAVENOUS
  Administered 2024-10-11: 40 mg via INTRAVENOUS

## 2024-10-11 MED ORDER — PROPOFOL 10 MG/ML IV BOLUS
INTRAVENOUS | Status: DC | PRN
  Administered 2024-10-11: 20 mg via INTRAVENOUS
  Administered 2024-10-11: 100 mg via INTRAVENOUS
  Administered 2024-10-11: 40 mg via INTRAVENOUS
  Administered 2024-10-11: 20 mg via INTRAVENOUS

## 2024-10-11 MED ORDER — GABAPENTIN 100 MG PO CAPS
200.0000 mg | ORAL_CAPSULE | Freq: Two times a day (BID) | ORAL | Status: DC
  Administered 2024-10-11 – 2024-10-12 (×3): 200 mg via ORAL
  Filled 2024-10-11 (×3): qty 2

## 2024-10-11 MED ORDER — SUGAMMADEX SODIUM 200 MG/2ML IV SOLN
INTRAVENOUS | Status: DC | PRN
  Administered 2024-10-11: 200 mg via INTRAVENOUS

## 2024-10-11 MED ORDER — ONDANSETRON HCL 4 MG/2ML IJ SOLN: 4.0000 mg | Freq: Once | INTRAMUSCULAR | Status: DC | PRN

## 2024-10-11 MED ORDER — METOPROLOL SUCCINATE ER 50 MG PO TB24
100.0000 mg | ORAL_TABLET | Freq: Every morning | ORAL | Status: DC
  Administered 2024-10-12: 100 mg via ORAL
  Filled 2024-10-11: qty 2

## 2024-10-11 MED ORDER — SUCCINYLCHOLINE CHLORIDE 200 MG/10ML IV SOSY
PREFILLED_SYRINGE | INTRAVENOUS | Status: DC | PRN
  Administered 2024-10-11: 120 mg via INTRAVENOUS

## 2024-10-11 MED ORDER — LIDOCAINE HCL (CARDIAC) PF 100 MG/5ML IV SOSY
PREFILLED_SYRINGE | INTRAVENOUS | Status: DC | PRN
  Administered 2024-10-11: 100 mg via INTRAVENOUS

## 2024-10-11 MED ORDER — ONDANSETRON HCL 4 MG/2ML IJ SOLN
INTRAMUSCULAR | Status: DC | PRN
  Administered 2024-10-11: 4 mg via INTRAVENOUS

## 2024-10-11 MED ORDER — FAMOTIDINE 20 MG PO TABS
20.0000 mg | ORAL_TABLET | Freq: Every day | ORAL | Status: DC
  Administered 2024-10-12: 20 mg via ORAL
  Filled 2024-10-11: qty 1

## 2024-10-11 MED ORDER — PHENYLEPHRINE HCL (PRESSORS) 10 MG/ML IV SOLN
INTRAVENOUS | Status: DC | PRN
  Administered 2024-10-11: 40 ug via INTRAVENOUS

## 2024-10-11 MED ORDER — PROPOFOL 10 MG/ML IV BOLUS
INTRAVENOUS | Status: AC
  Filled 2024-10-11: qty 20

## 2024-10-11 MED ORDER — LACTATED RINGERS IV SOLN: INTRAVENOUS | Status: DC

## 2024-10-11 MED ORDER — 0.9 % SODIUM CHLORIDE (POUR BTL) OPTIME
TOPICAL | Status: DC | PRN
  Administered 2024-10-11: 1000 mL

## 2024-10-11 MED ORDER — CIPROFLOXACIN IN D5W 400 MG/200ML IV SOLN
400.0000 mg | INTRAVENOUS | Status: AC
  Administered 2024-10-11: 400 mg via INTRAVENOUS
  Filled 2024-10-11: qty 200

## 2024-10-11 MED ORDER — ONDANSETRON HCL 4 MG/2ML IJ SOLN: 4.0000 mg | Freq: Four times a day (QID) | INTRAMUSCULAR | Status: DC | PRN

## 2024-10-11 MED ORDER — LEVOTHYROXINE SODIUM 88 MCG PO TABS
88.0000 ug | ORAL_TABLET | Freq: Every day | ORAL | Status: DC
  Administered 2024-10-12: 88 ug via ORAL
  Filled 2024-10-11: qty 1

## 2024-10-11 MED ORDER — ONDANSETRON 4 MG PO TBDP: 4.0000 mg | ORAL_TABLET | Freq: Four times a day (QID) | ORAL | Status: DC | PRN

## 2024-10-11 MED ORDER — FUROSEMIDE 40 MG PO TABS
40.0000 mg | ORAL_TABLET | Freq: Every day | ORAL | Status: DC
  Administered 2024-10-12: 40 mg via ORAL
  Filled 2024-10-11 (×2): qty 1

## 2024-10-11 MED ORDER — OXYCODONE HCL 5 MG PO TABS: 5.0000 mg | ORAL_TABLET | Freq: Once | ORAL | Status: DC | PRN

## 2024-10-11 MED ORDER — POTASSIUM CHLORIDE CRYS ER 10 MEQ PO TBCR
10.0000 meq | EXTENDED_RELEASE_TABLET | Freq: Every day | ORAL | Status: DC
  Administered 2024-10-11 – 2024-10-12 (×2): 10 meq via ORAL
  Filled 2024-10-11 (×4): qty 1

## 2024-10-11 MED ORDER — HEMOSTATIC AGENTS (NO CHARGE) OPTIME
TOPICAL | Status: DC | PRN
  Administered 2024-10-11: 1 via TOPICAL

## 2024-10-11 MED ORDER — CHLORHEXIDINE GLUCONATE 0.12 % MT SOLN: 15.0000 mL | Freq: Once | OROMUCOSAL | Status: DC

## 2024-10-11 MED ORDER — PROPOFOL 500 MG/50ML IV EMUL
INTRAVENOUS | Status: DC | PRN
  Administered 2024-10-11: 30 ug/kg/min via INTRAVENOUS

## 2024-10-11 MED ORDER — SUGAMMADEX SODIUM 200 MG/2ML IV SOLN
INTRAVENOUS | Status: AC
  Filled 2024-10-11: qty 2

## 2024-10-11 MED ORDER — ACETAMINOPHEN 325 MG PO TABS
650.0000 mg | ORAL_TABLET | Freq: Four times a day (QID) | ORAL | Status: DC | PRN
  Administered 2024-10-11: 650 mg via ORAL
  Filled 2024-10-11: qty 2

## 2024-10-11 MED ORDER — LOSARTAN POTASSIUM 50 MG PO TABS
100.0000 mg | ORAL_TABLET | Freq: Every evening | ORAL | Status: DC
  Administered 2024-10-11: 100 mg via ORAL
  Filled 2024-10-11: qty 2

## 2024-10-11 MED ORDER — HYDROMORPHONE HCL 1 MG/ML IJ SOLN: 1.0000 mg | INTRAMUSCULAR | Status: DC | PRN

## 2024-10-11 MED ORDER — ROCURONIUM BROMIDE 10 MG/ML (PF) SYRINGE
PREFILLED_SYRINGE | INTRAVENOUS | Status: AC
  Filled 2024-10-11: qty 10

## 2024-10-11 MED ORDER — FENTANYL CITRATE (PF) 100 MCG/2ML IJ SOLN
INTRAMUSCULAR | Status: AC
  Filled 2024-10-11: qty 2

## 2024-10-11 MED ORDER — FENTANYL CITRATE (PF) 50 MCG/ML IJ SOSY
PREFILLED_SYRINGE | INTRAMUSCULAR | Status: AC
  Filled 2024-10-11: qty 1

## 2024-10-11 MED ORDER — OXYCODONE HCL 5 MG PO TABS: 5.0000 mg | ORAL_TABLET | ORAL | Status: DC | PRN

## 2024-10-11 MED ORDER — FENTANYL CITRATE (PF) 50 MCG/ML IJ SOSY
PREFILLED_SYRINGE | INTRAMUSCULAR | Status: AC
  Filled 2024-10-11: qty 2

## 2024-10-11 MED ORDER — TRAMADOL HCL 50 MG PO TABS: 50.0000 mg | ORAL_TABLET | Freq: Four times a day (QID) | ORAL | Status: DC | PRN

## 2024-10-11 MED ORDER — DEXAMETHASONE SOD PHOSPHATE PF 10 MG/ML IJ SOLN
INTRAMUSCULAR | Status: DC | PRN
  Administered 2024-10-11: 4 mg via INTRAVENOUS

## 2024-10-11 SURGICAL SUPPLY — 27 items
BLADE SURG 15 STRL LF DISP TIS (BLADE) ×1 IMPLANT
CLIP TI MEDIUM 6 (CLIP) ×2 IMPLANT
CLIP TI WIDE RED SMALL 6 (CLIP) ×2 IMPLANT
CNTNR URN SCR LID CUP LEK RST (MISCELLANEOUS) IMPLANT
COVER SURGICAL LIGHT HANDLE (MISCELLANEOUS) ×1 IMPLANT
DERMABOND ADVANCED .7 DNX12 (GAUZE/BANDAGES/DRESSINGS) ×1 IMPLANT
DRAPE LAPAROTOMY T 98X78 PEDS (DRAPES) ×1 IMPLANT
DRAPE UTILITY XL STRL (DRAPES) ×1 IMPLANT
ELECT PENCIL ROCKER SW 15FT (MISCELLANEOUS) ×1 IMPLANT
ELECT REM PT RETURN 15FT ADLT (MISCELLANEOUS) ×1 IMPLANT
GAUZE 4X4 16PLY ~~LOC~~+RFID DBL (SPONGE) ×1 IMPLANT
GLOVE SURG ORTHO 8.0 STRL STRW (GLOVE) ×1 IMPLANT
GOWN STRL REUS W/ TWL XL LVL3 (GOWN DISPOSABLE) ×2 IMPLANT
HEMOSTAT SURGICEL 2X4 FIBR (HEMOSTASIS) ×1 IMPLANT
ILLUMINATOR WAVEGUIDE N/F (MISCELLANEOUS) ×1 IMPLANT
KIT BASIN OR (CUSTOM PROCEDURE TRAY) ×1 IMPLANT
KIT TURNOVER KIT A (KITS) ×1 IMPLANT
PACK BASIC VI WITH GOWN DISP (CUSTOM PROCEDURE TRAY) ×1 IMPLANT
PAD MAGNETIC INSTR ST 16X20 (MISCELLANEOUS) ×1 IMPLANT
SHEARS HARMONIC 9CM CVD (BLADE) ×1 IMPLANT
SOL PREP POV-IOD 4OZ 10% (MISCELLANEOUS) IMPLANT
SUT MNCRL AB 4-0 PS2 18 (SUTURE) ×1 IMPLANT
SUT SILK 3 0 SH 30 (SUTURE) ×1 IMPLANT
SUT VIC AB 3-0 SH 18 (SUTURE) ×2 IMPLANT
SYR BULB IRRIG 60ML STRL (SYRINGE) ×1 IMPLANT
TOWEL OR DSP ST BLU DLX 10/PK (DISPOSABLE) ×1 IMPLANT
TUBING CONNECTING 10 (TUBING) ×1 IMPLANT

## 2024-10-11 NOTE — Transfer of Care (Signed)
 Immediate Anesthesia Transfer of Care Note  Patient: Grace Delacruz  Procedure(s) Performed: THYROIDECTOMY, COMPLETION (Left: Neck)  Patient Location: PACU  Anesthesia Type:General  Level of Consciousness: awake and alert   Airway & Oxygen Therapy: Patient Spontanous Breathing and Patient connected to nasal cannula oxygen  Post-op Assessment: Report given to RN and Post -op Vital signs reviewed and stable  Post vital signs: Reviewed and stable  Last Vitals:  Vitals Value Taken Time  BP 152/76 10/11/24 09:09  Temp    Pulse 68 10/11/24 09:12  Resp 18 10/11/24 09:12  SpO2 96 % 10/11/24 09:12  Vitals shown include unfiled device data.  Last Pain:  Vitals:   10/11/24 0602  TempSrc: Oral         Complications: No notable events documented.

## 2024-10-11 NOTE — Anesthesia Procedure Notes (Signed)
 Procedure Name: Intubation Date/Time: 10/11/2024 7:36 AM  Performed by: Metta Andrea NOVAK, CRNAPre-anesthesia Checklist: Patient identified, Emergency Drugs available, Suction available, Patient being monitored and Timeout performed Patient Re-evaluated:Patient Re-evaluated prior to induction Oxygen Delivery Method: Circle system utilized Preoxygenation: Pre-oxygenation with 100% oxygen Induction Type: IV induction Ventilation: Mask ventilation without difficulty Laryngoscope Size: Mac and 4 Grade View: Grade I Tube type: Oral Tube size: 7.0 mm Number of attempts: 1 Airway Equipment and Method: Stylet Placement Confirmation: ETT inserted through vocal cords under direct vision, positive ETCO2 and breath sounds checked- equal and bilateral Secured at: 21 cm Tube secured with: Tape Dental Injury: Teeth and Oropharynx as per pre-operative assessment

## 2024-10-11 NOTE — Anesthesia Postprocedure Evaluation (Signed)
"   Anesthesia Post Note  Patient: Grace Delacruz  Procedure(s) Performed: THYROIDECTOMY, COMPLETION (Left: Neck)     Patient location during evaluation: PACU Anesthesia Type: General Level of consciousness: awake Pain management: pain level controlled Vital Signs Assessment: post-procedure vital signs reviewed and stable Respiratory status: spontaneous breathing Cardiovascular status: blood pressure returned to baseline Postop Assessment: no apparent nausea or vomiting Anesthetic complications: no   No notable events documented.                Lauraine DASEN Colhoun      "

## 2024-10-11 NOTE — Op Note (Signed)
 Procedure Note  Pre-operative Diagnosis:  left thyroid  nodules  Post-operative Diagnosis:  same  Surgeon:  Krystal Spinner, MD  Assistant:  Tonja Shaper, PA-C   Procedure:  Completion thyroidectomy (left thyroid  lobectomy and isthmusectomy)  Anesthesia:  General  Estimated Blood Loss:  20 cc  Drains: none         Specimen: thyroid  lobe to pathology  Indications:  Patient returns to my practice for follow-up of left sided thyroid  nodule. Patient had undergone a right thyroid  lobectomy approximately 45 years ago by Dr. Lamar Beck. Patient has developed a left sided thyroid  nodule. This was evaluated last year with an ultrasound and a fine-needle aspiration biopsy. The nodule measured 3.3 cm and fine-needle aspiration cytology was benign. A new ultrasound study was obtained on July 29, 2024. This shows enlargement of the dominant nodule in the left lobe, now measuring 4.0 cm in greatest dimension. There is also a new nodule in the left lobe measuring 2.0 cm. Patient is on levothyroxine  88 mcg daily. Her recent TSH level is normal at 0.70. Patient does note new onset of hoarseness. She has mild dysphagia. She has nighttime hypoxia and wears oxygen at night. She is followed by pulmonary medicine, Dr. Quita Salt. Patient presents today to review the results of her recent ultrasound and to discuss possible surgical management. She is accompanied by her husband.   Procedure Details: Procedure was done in OR #1 at the [facility]. The patient was brought to the operating room and placed in a supine position on the operating room table. Following administration of general anesthesia, the patient was positioned and then prepped and draped in the usual aseptic fashion. After ascertaining that an adequate level of anesthesia had been achieved, a small Kocher incision was made with #15 blade utilizing the previous surgical scar. Dissection was carried through subcutaneous tissues and platysma.  Hemostasis was achieved with the electrocautery. Skin flaps were elevated cephalad and caudad from the thyroid  notch to the sternal notch. A self-retaining retractor was placed for exposure. Strap muscles were incised in the midline and dissection was begun on the left side. Strap muscles were reflected laterally. The left thyroid  lobe was enlarged with a dominant nodule anteriorly and multiple small nodules. The lobe was gently mobilized with blunt dissection. Superior pole vessels were dissected out and divided individually between small and medium ligaclips with the harmonic scalpel. The thyroid  lobe was rolled anteriorly. Branches of the inferior thyroid  artery were divided between small ligaclips with the harmonic scalpel. Inferior venous tributaries were divided between ligaclips. Both the superior and inferior parathyroid glands were identified and preserved on their vascular pedicles. The recurrent laryngeal nerve was identified and preserved along its course. The ligament of Court was released with the electrocautery and the gland was mobilized onto the anterior trachea. Isthmus was mobilized across the midline. There was no significant pyramidal lobe present. The thyroid  lobe and isthmus were submitted to pathology for review.  The entire field was palpated for evidence of lymphadenopathy or extra-thyroidal disease.  No worrisome findings were noted.  No enlarged lymph nodes were identified.  The neck was irrigated with warm saline. Fibrillar was placed throughout the operative field. Strap muscles were approximated in the midline with interrupted 3-0 Vicryl sutures. Platysma was closed with interrupted 3-0 Vicryl sutures. Skin was closed with a running 4-0 Monocryl subcuticular suture.  Wound was washed and dried and Dermabond was applied. The patient was awakened from anesthesia and brought to the recovery room. The  patient tolerated the procedure well.   Krystal Spinner, MD [facility]  Surgery Office: [phone]

## 2024-10-11 NOTE — Interval H&P Note (Signed)
 History and Physical Interval Note:  10/11/2024 7:02 AM  [facility]  has presented today for surgery, with the diagnosis of MULTIPLE THYROID  NODULES.  The various methods of treatment have been discussed with the patient and family. After consideration of risks, benefits and other options for treatment, the patient has consented to    Procedures with comments: THYROIDECTOMY, COMPLETION (Left) - LEFT LOBE as a surgical intervention.    The patient's history has been reviewed, patient examined, no change in status, stable for surgery.  I have reviewed the patient's chart and labs.  Questions were answered to the patient's satisfaction.    Krystal Spinner, MD [facility] practice Office: [phone]   Krystal Spinner

## 2024-10-12 ENCOUNTER — Encounter (HOSPITAL_COMMUNITY): Payer: Self-pay | Admitting: Surgery

## 2024-10-12 ENCOUNTER — Ambulatory Visit: Payer: Self-pay | Admitting: Surgery

## 2024-10-12 DIAGNOSIS — E042 Nontoxic multinodular goiter: Secondary | ICD-10-CM | POA: Diagnosis not present

## 2024-10-12 LAB — SURGICAL PATHOLOGY

## 2024-10-12 LAB — CALCIUM: Calcium: 8.6 mg/dL — ABNORMAL LOW (ref 8.9–10.3)

## 2024-10-12 NOTE — TOC Transition Note (Signed)
 Transition of Care [facility]) - Discharge Note   Patient Details  Name: Grace Delacruz MRN: [id number] Date of Birth: [birth year]  Transition of [facility]) CM/SW Contact:  Alfonse JONELLE Rex, RN Phone Number: 10/12/2024, 11:13 AM   Clinical Narrative:   Admitted on 10/11/24 for a scheduled procedure. No INPT CM needs           Patient Goals and CMS Choice            Discharge Placement                       Discharge Plan and Services Additional resources added to the After Visit Summary for                                       Social Drivers of Health (SDOH) Interventions SDOH Screenings   Food Insecurity: No Food Insecurity (10/11/2024)  Housing: Low Risk (10/11/2024)  Transportation Needs: No Transportation Needs (10/11/2024)  Utilities: Not At Risk (10/11/2024)  Financial Resource Strain: Low Risk (06/10/2023)   Received from [facility]  Social Connections: Moderately Integrated (10/11/2024)  Tobacco Use: Low Risk (10/11/2024)  Health Literacy: Medium Risk (06/10/2023)   Received from [facility]     Readmission Risk Interventions     No data to display

## 2024-10-12 NOTE — Plan of Care (Signed)

## 2024-10-12 NOTE — Discharge Instructions (Addendum)

## 2024-10-12 NOTE — Progress Notes (Signed)
 IV removed, instructions reviewed with understanding verbalized, dressed, belongings packed, transferred to front entrance via wheelchair.

## 2024-10-12 NOTE — Plan of Care (Signed)
" °  Problem: Education: Goal: Knowledge of General Education information will improve Description: Including pain rating scale, medication(s)/side effects and non-pharmacologic comfort measures 10/12/2024 0810 by Alaina Dozier PARAS, RN Outcome: Adequate for Discharge 10/12/2024 0755 by Alaina Dozier PARAS, RN Outcome: Progressing   Problem: Health Behavior/Discharge Planning: Goal: Ability to manage health-related needs will improve 10/12/2024 0810 by Alaina Dozier PARAS, RN Outcome: Adequate for Discharge 10/12/2024 0755 by Alaina Dozier PARAS, RN Outcome: Progressing   Problem: Clinical Measurements: Goal: Ability to maintain clinical measurements within normal limits will improve 10/12/2024 0810 by Alaina Dozier PARAS, RN Outcome: Adequate for Discharge 10/12/2024 0755 by Alaina Dozier PARAS, RN Outcome: Progressing Goal: Will remain free from infection 10/12/2024 0810 by Alaina Dozier PARAS, RN Outcome: Adequate for Discharge 10/12/2024 0755 by Alaina Dozier PARAS, RN Outcome: Progressing Goal: Diagnostic test results will improve 10/12/2024 0810 by Alaina Dozier PARAS, RN Outcome: Adequate for Discharge 10/12/2024 0755 by Alaina Dozier PARAS, RN Outcome: Progressing Goal: Respiratory complications will improve Outcome: Adequate for Discharge Goal: Cardiovascular complication will be avoided Outcome: Adequate for Discharge   Problem: Activity: Goal: Risk for activity intolerance will decrease Outcome: Adequate for Discharge   Problem: Nutrition: Goal: Adequate nutrition will be maintained Outcome: Adequate for Discharge   Problem: Coping: Goal: Level of anxiety will decrease Outcome: Adequate for Discharge   Problem: Elimination: Goal: Will not experience complications related to bowel motility Outcome: Adequate for Discharge Goal: Will not experience complications related to urinary retention Outcome: Adequate for Discharge   Problem: Pain  Managment: Goal: General experience of comfort will improve and/or be controlled Outcome: Adequate for Discharge   Problem: Safety: Goal: Ability to remain free from injury will improve Outcome: Adequate for Discharge   Problem: Skin Integrity: Goal: Risk for impaired skin integrity will decrease Outcome: Adequate for Discharge   "

## 2024-10-12 NOTE — Progress Notes (Signed)
 Pathology is benign, as expected.  Good news.  Darnell Level, MD [facility] practice Office: [phone]

## 2024-10-12 NOTE — Discharge Summary (Signed)
 "   Physician Discharge Summary   Patient ID: Grace Delacruz MRN: [id number] DOB/AGE: [birth year] 78 y.o.  Admit date: 10/11/2024  Discharge date: 10/12/2024  Discharge Diagnoses:  Principal Problem:   Left thyroid  nodule   Discharged Condition: good  Hospital Course: Patient was admitted for observation following thyroid  surgery.  Post op course was uncomplicated.  Pain was well controlled.  Tolerated diet.  Post op calcium level on morning following surgery was 8.6 mg/dl.  Patient was prepared for discharge home on POD#1.  Consults: None  Treatments: surgery: completion thyroidectomy (left thyroid  lobe)  Discharge Exam: Blood pressure (!) 160/75, pulse 64, temperature 98 F (36.7 C), temperature source Oral, resp. rate 16, height 5' 4 (1.626 m), weight 107 kg, SpO2 100%. HEENT - clear Neck - wound dry and intact; mild STS and ecchymosis; voice normal  Disposition: Home  Discharge Instructions     Increase activity slowly   Complete by: As directed    No dressing needed   Complete by: As directed       Allergies as of 10/12/2024       Reactions   Ephedrine  Palpitations   Heart rate fast-   Hibiclens  [chlorhexidine  Gluconate] Rash   SKIN REDNESS AND BURNING SENSATION    NO CHG   Keflex [cephalexin] Itching, Rash        Medication List     STOP taking these medications    Vitamin D (Cholecalciferol) 25 MCG (1000 UT) Caps       TAKE these medications    acetaminophen  500 MG tablet Commonly known as: TYLENOL  Take 1,000 mg by mouth every 4 (four) hours as needed for moderate pain (pain score 4-6), mild pain (pain score 1-3) or headache.   aspirin  EC 81 MG tablet Take 81 mg by mouth at bedtime.   CALCIUM 600 PO Take 600 mg by mouth 2 (two) times daily.   CoQ-10 200 MG Caps Take 200 mg by mouth daily.   CRANBERRY EXTRACT PO Take 2 tablets by mouth daily.   docusate sodium 100 MG capsule Commonly known as: COLACE Take 100 mg by mouth 2 (two)  times daily.   DULoxetine  30 MG capsule Commonly known as: Cymbalta  Take 1 capsule (30 mg total) by mouth daily.   famotidine  20 MG tablet Commonly known as: Pepcid  One after supper What changed:  how much to take how to take this when to take this additional instructions   furosemide  40 MG tablet Commonly known as: LASIX  Take 40 mg by mouth daily.   gabapentin  100 MG capsule Commonly known as: Neurontin  Take 1 capsule (100 mg total) by mouth 4 (four) times daily. What changed:  how much to take when to take this   Gemtesa  75 MG Tabs Generic drug: Vibegron  Take 1 tablet (75 mg total) by mouth daily.   levothyroxine  88 MCG tablet Commonly known as: SYNTHROID  Take 1 tablet (88 mcg total) by mouth daily.   loratadine 10 MG tablet Commonly known as: CLARITIN Take 10 mg by mouth daily.   losartan  100 MG tablet Commonly known as: COZAAR  Take 1 tablet (100 mg total) by mouth every evening.   MAGNESIUM GLYCINATE PO Take 300 mg by mouth daily.   meclizine 25 MG tablet Commonly known as: ANTIVERT Take 25 mg by mouth at bedtime as needed for dizziness.   mesalamine  1.2 g EC tablet Commonly known as: LIALDA  Take 2 tablets (2.4 g total) by mouth daily with breakfast.   metoprolol  succinate 100  MG 24 hr tablet Commonly known as: TOPROL -XL Take 100 mg by mouth every morning. Take with or immediately following a meal.   multivitamin tablet Take 1 tablet by mouth daily.   OXYGEN Inhale 2 L into the lungs at bedtime.   Ozempic (0.25 or 0.5 MG/DOSE) 2 MG/1.5ML Sopn Generic drug: Semaglutide(0.25 or 0.5MG /DOS) Inject 2 mLs into the skin once a week.   potassium chloride  10 MEQ tablet Commonly known as: KLOR-CON  Take 10 mEq by mouth daily.   RABEprazole  20 MG tablet Commonly known as: ACIPHEX  Take 1 tablet (20 mg total) by mouth daily.   simvastatin 80 MG tablet Commonly known as: ZOCOR Take 80 mg by mouth at bedtime.   sodium chloride  0.65 % Soln nasal  spray Commonly known as: OCEAN Place 1 spray into both nostrils daily as needed for congestion.   triamcinolone  cream 0.5 % Commonly known as: KENALOG  Apply 1 application topically 3 (three) times daily as needed. For rash/irritation.   vitamin C 1000 MG tablet Take 1,000 mg by mouth daily.               Discharge Care Instructions  (From admission, onward)           Start     Ordered   10/12/24 0000  No dressing needed        10/12/24 9085            Follow-up Information     Eletha Boas, MD. Schedule an appointment as soon as possible for a visit in 3 week(s).   Specialty: General Surgery Why: For wound re-check Contact information: [address] [place] [address] [phone]                 Boas Eletha, MD [facility] Office: [phone]   Signed: Boas Eletha 10/12/2024, 9:16 AM   "

## 2024-10-18 ENCOUNTER — Telehealth: Payer: Self-pay | Admitting: Nurse Practitioner

## 2024-10-18 DIAGNOSIS — E890 Postprocedural hypothyroidism: Secondary | ICD-10-CM

## 2024-10-18 DIAGNOSIS — E041 Nontoxic single thyroid nodule: Secondary | ICD-10-CM

## 2024-10-18 NOTE — Telephone Encounter (Signed)
 Canceled appt, tried to call pt to let her know of message below. If she calls back, we need to see her in 8 weeks and she can cancel her ultrasound

## 2024-10-18 NOTE — Telephone Encounter (Signed)
 Cancel the ultrasound.  We will need to see her in 8 weeks with labs to adjust her Levothyroxine  accordingly.

## 2024-10-18 NOTE — Telephone Encounter (Signed)
 Pt called and said that she had her thyroid  out and wants to know if she needs to see you sooner than April, and does she need to cancel her ultrasound?

## 2024-10-30 ENCOUNTER — Other Ambulatory Visit: Payer: Self-pay

## 2024-10-30 ENCOUNTER — Emergency Department (HOSPITAL_COMMUNITY)

## 2024-10-30 ENCOUNTER — Encounter (HOSPITAL_COMMUNITY): Payer: Self-pay | Admitting: Emergency Medicine

## 2024-10-30 ENCOUNTER — Emergency Department (HOSPITAL_COMMUNITY)
Admission: EM | Admit: 2024-10-30 | Discharge: 2024-10-30 | Disposition: A | Discharge status: Home / Self Care | Attending: Emergency Medicine | Admitting: Emergency Medicine

## 2024-10-30 DIAGNOSIS — R609 Edema, unspecified: Secondary | ICD-10-CM

## 2024-10-30 DIAGNOSIS — Z7982 Long term (current) use of aspirin: Secondary | ICD-10-CM | POA: Insufficient documentation

## 2024-10-30 DIAGNOSIS — I10 Essential (primary) hypertension: Secondary | ICD-10-CM | POA: Insufficient documentation

## 2024-10-30 DIAGNOSIS — Z79899 Other long term (current) drug therapy: Secondary | ICD-10-CM | POA: Insufficient documentation

## 2024-10-30 DIAGNOSIS — R600 Localized edema: Secondary | ICD-10-CM | POA: Insufficient documentation

## 2024-10-30 DIAGNOSIS — E039 Hypothyroidism, unspecified: Secondary | ICD-10-CM | POA: Diagnosis not present

## 2024-10-30 DIAGNOSIS — R519 Headache, unspecified: Secondary | ICD-10-CM | POA: Diagnosis present

## 2024-10-30 LAB — COMPREHENSIVE METABOLIC PANEL WITH GFR
ALT: 15 U/L (ref 0–44)
AST: 27 U/L (ref 15–41)
Albumin: 3.9 g/dL (ref 3.5–5.0)
Alkaline Phosphatase: 65 U/L (ref 38–126)
Anion gap: 7 (ref 5–15)
BUN: 18 mg/dL (ref 8–23)
CO2: 32 mmol/L (ref 22–32)
Calcium: 8.9 mg/dL (ref 8.9–10.3)
Chloride: 102 mmol/L (ref 98–111)
Creatinine, Ser: 1.03 mg/dL — ABNORMAL HIGH (ref 0.44–1.00)
GFR, Estimated: 56 mL/min — ABNORMAL LOW
Glucose, Bld: 156 mg/dL — ABNORMAL HIGH (ref 70–99)
Potassium: 3.6 mmol/L (ref 3.5–5.1)
Sodium: 141 mmol/L (ref 135–145)
Total Bilirubin: 0.5 mg/dL (ref 0.0–1.2)
Total Protein: 6.7 g/dL (ref 6.5–8.1)

## 2024-10-30 LAB — CBC WITH DIFFERENTIAL/PLATELET
Abs Immature Granulocytes: 0.02 K/uL (ref 0.00–0.07)
Basophils Absolute: 0.1 K/uL (ref 0.0–0.1)
Basophils Relative: 1 %
Eosinophils Absolute: 0.3 K/uL (ref 0.0–0.5)
Eosinophils Relative: 3 %
HCT: 43.3 % (ref 36.0–46.0)
Hemoglobin: 14.4 g/dL (ref 12.0–15.0)
Immature Granulocytes: 0 %
Lymphocytes Relative: 33 %
Lymphs Abs: 2.8 K/uL (ref 0.7–4.0)
MCH: 30.5 pg (ref 26.0–34.0)
MCHC: 33.3 g/dL (ref 30.0–36.0)
MCV: 91.7 fL (ref 80.0–100.0)
Monocytes Absolute: 0.7 K/uL (ref 0.1–1.0)
Monocytes Relative: 9 %
Neutro Abs: 4.6 K/uL (ref 1.7–7.7)
Neutrophils Relative %: 54 %
Platelets: 237 K/uL (ref 150–400)
RBC: 4.72 MIL/uL (ref 3.87–5.11)
RDW: 12.7 % (ref 11.5–15.5)
WBC: 8.5 K/uL (ref 4.0–10.5)
nRBC: 0 % (ref 0.0–0.2)

## 2024-10-30 LAB — PRO BRAIN NATRIURETIC PEPTIDE: Pro Brain Natriuretic Peptide: 1353 pg/mL — ABNORMAL HIGH

## 2024-10-30 LAB — T4, FREE: Free T4: 0.54 ng/dL — ABNORMAL LOW (ref 0.80–2.00)

## 2024-10-30 LAB — TROPONIN T, HIGH SENSITIVITY
Troponin T High Sensitivity: 20 ng/L — ABNORMAL HIGH (ref 0–19)
Troponin T High Sensitivity: 17 ng/L (ref 0–19)

## 2024-10-30 LAB — TSH: TSH: 28 u[IU]/mL — ABNORMAL HIGH (ref 0.350–4.500)

## 2024-10-30 MED ORDER — FUROSEMIDE 10 MG/ML IJ SOLN
40.0000 mg | Freq: Once | INTRAMUSCULAR | Status: AC
  Administered 2024-10-30: 40 mg via INTRAVENOUS
  Filled 2024-10-30: qty 4

## 2024-10-30 NOTE — ED Triage Notes (Signed)
 Pt here with c/o HTN. States her BP has been elevated all day. States highest BP was 212/108 and that BP PTA was 209/98. Pt states she is compliant with her BP medication. Also states she called her PCP who called in a new BP medication. Pt states she wasn't able to pick up new medication from pharmacy until after 7. Pt took as soon as she received it.

## 2024-10-30 NOTE — Discharge Instructions (Signed)
 You do not need any more Lasix  today double your Lasix  dose starting on Sunday and take double doses Sunday, Monday and Tuesday.  Follow-up with the prescribing doctor for repeat evaluation.  I have sent a message to your surgery team and your endocrinologist about your TSH.  The T3 and T4 should be back before they see the message and they can make modifications as needed.  Return to the ED for any new or worsening symptoms.  Keep track of your blood pressures at home to follow-up if you consistently have blood pressures over 140 systolic you can take your extra amlodipine .

## 2024-10-30 NOTE — ED Notes (Addendum)
 No note needed

## 2024-10-30 NOTE — ED Provider Notes (Signed)
 " [place] [facility]GENCY DEPARTMENT AT [facility] Provider Note   CSN: [id number] Arrival date & time: 10/30/24  9970     Patient presents with: Hypertension   Grace Delacruz is a 79 y.o. female.   79 year old female who presents ER today with headache.  Patient states that she had a headache for 2 or 3 days.  She thinks is related to her blood pressure being high.  She states that she started amlodipine  last night which was a new prescription however still stayed high so she present here for further evaluation.  She denies any neurologic changes.  She is worried that she was having a stroke because of her headache yesterday morning.  She did recently have her thyroid  out a few weeks ago.  She is noticed some increased swelling in her stomach and in her face that she thinks might be related.  No shortness of breath or chest pain.  Mild fatigue but not significant.  Does not check her blood pressures daily but when she does get them checked they are130-140. She thinks maybe anxiety played a role in her BP elevation tonight but is still much higher than she is used to. No vision change, abdominal pain, back pain.    Hypertension       Prior to Admission medications  Medication Sig Start Date End Date Taking? Authorizing Provider  acetaminophen  (TYLENOL ) 500 MG tablet Take 1,000 mg by mouth every 4 (four) hours as needed for moderate pain (pain score 4-6), mild pain (pain score 1-3) or headache.    [provider]  Ascorbic Acid (VITAMIN C) 1000 MG tablet Take 1,000 mg by mouth daily.    [provider]  aspirin  EC 81 MG tablet Take 81 mg by mouth at bedtime.     [provider]  Calcium Carbonate (CALCIUM 600 PO) Take 600 mg by mouth 2 (two) times daily.    [provider]  Coenzyme Q10 (COQ-10) 200 MG CAPS Take 200 mg by mouth daily.    [provider]  CRANBERRY EXTRACT PO Take 2 tablets by mouth daily.    [provider]   docusate sodium (COLACE) 100 MG capsule Take 100 mg by mouth 2 (two) times daily.    [provider]  DULoxetine  (CYMBALTA ) 30 MG capsule Take 1 capsule (30 mg total) by mouth daily. 07/22/24   Sater, Charlie LABOR, MD  famotidine  (PEPCID ) 20 MG tablet One after supper Patient taking differently: Take 20 mg by mouth daily. 10/21/23   Darlean Ozell NOVAK, MD  furosemide  (LASIX ) 40 MG tablet Take 40 mg by mouth daily.    [provider]  gabapentin  (NEURONTIN ) 100 MG capsule Take 1 capsule (100 mg total) by mouth 4 (four) times daily. Patient taking differently: Take 200 mg by mouth 2 (two) times daily. 03/17/24   Darlean Ozell NOVAK, MD  levothyroxine  (SYNTHROID ) 88 MCG tablet Take 1 tablet (88 mcg total) by mouth daily. 08/18/24   Therisa Benton PARAS, NP  loratadine (CLARITIN) 10 MG tablet Take 10 mg by mouth daily.    [provider]  losartan  (COZAAR ) 100 MG tablet Take 1 tablet (100 mg total) by mouth every evening. 08/24/13   Debera Jayson KANDICE, MD  MAGNESIUM GLYCINATE PO Take 300 mg by mouth daily.    [provider]  meclizine (ANTIVERT) 25 MG tablet Take 25 mg by mouth at bedtime as needed for dizziness.    [provider]  mesalamine  (LIALDA ) 1.2  g EC tablet Take 2 tablets (2.4 g total) by mouth daily with breakfast. 09/07/24   Pyrtle, Gordy HERO, MD  metoprolol  succinate (TOPROL -XL) 100 MG 24 hr tablet Take 100 mg by mouth every morning. Take with or immediately following a meal.    [provider]  Multiple Vitamin (MULTIVITAMIN) tablet Take 1 tablet by mouth daily.    [provider]  OXYGEN Inhale 2 L into the lungs at bedtime.    [provider]  OZEMPIC, 0.25 OR 0.5 MG/DOSE, 2 MG/1.5ML SOPN Inject 2 mLs into the skin once a week. 03/02/20   [provider]  potassium chloride  (KLOR-CON ) 10 MEQ tablet Take 10 mEq by mouth daily.    [provider]  RABEprazole  (ACIPHEX ) 20 MG tablet Take 1 tablet (20 mg total) by  mouth daily. Patient not taking: Reported on 09/28/2024 01/29/24   Albertus Gordy HERO, MD  simvastatin (ZOCOR) 80 MG tablet Take 80 mg by mouth at bedtime. 01/22/22   [provider]  sodium chloride  (OCEAN) 0.65 % SOLN nasal spray Place 1 spray into both nostrils daily as needed for congestion.    [provider]  triamcinolone  cream (KENALOG ) 0.5 % Apply 1 application topically 3 (three) times daily as needed. For rash/irritation. 06/21/16   [provider]  Vibegron  (GEMTESA ) 75 MG TABS Take 1 tablet (75 mg total) by mouth daily. 09/01/24   McKenzie, Belvie CROME, MD    Allergies: Ephedrine , Hibiclens  [chlorhexidine  gluconate], and Keflex [cephalexin]    Review of Systems  Updated Vital Signs BP (!) 166/98   Pulse 65   Temp 98.3 F (36.8 C) (Oral)   Resp 20   Ht 5' 4 (1.626 m)   Wt 107 kg   SpO2 93%   BMI 40.49 kg/m   Physical Exam Vitals and nursing note reviewed.  Constitutional:      Appearance: She is well-developed.  HENT:     Head: Normocephalic and atraumatic.     Comments: Mild periorbital edema, no proptosis Cardiovascular:     Rate and Rhythm: Normal rate and regular rhythm.  Pulmonary:     Effort: No respiratory distress.     Breath sounds: No stridor.  Abdominal:     General: There is distension (mild, no fluid wave, no pitting).  Musculoskeletal:     Cervical back: Normal range of motion.  Skin:    General: Skin is warm and dry.     Coloration: Skin is not jaundiced or pale.  Neurological:     General: No focal deficit present.     Mental Status: She is alert.     (all labs ordered are listed, but only abnormal results are displayed) Labs Reviewed  COMPREHENSIVE METABOLIC PANEL WITH GFR - Abnormal; Notable for the following components:      Result Value   Glucose, Bld 156 (*)    Creatinine, Ser 1.03 (*)    GFR, Estimated 56 (*)    All other components within normal limits  TSH - Abnormal; Notable for the following components:    TSH 28.000 (*)    All other components within normal limits  T4, FREE - Abnormal; Notable for the following components:   Free T4 0.54 (*)    All other components within normal limits  PRO BRAIN NATRIURETIC PEPTIDE - Abnormal; Notable for the following components:   Pro Brain Natriuretic Peptide 1,353.0 (*)    All other components within normal limits  TROPONIN T, HIGH SENSITIVITY - Abnormal; Notable  for the following components:   Troponin T High Sensitivity 20 (*)    All other components within normal limits  CBC WITH DIFFERENTIAL/PLATELET  T3, FREE  TROPONIN T, HIGH SENSITIVITY    EKG: None  Radiology: DG Chest Portable 1 View Result Date: 10/30/2024 EXAM: 1 VIEW XRAY OF THE CHEST 10/30/2024 04:02:16 AM COMPARISON: 03/08/2024 CLINICAL HISTORY: 79 year old female. Evaluation for persistent hypertension despite medication (209 systolic). FINDINGS: LUNGS AND PLEURA: Stable lung markings, no acute opacities. No pleural effusion. No pneumothorax. HEART AND MEDIASTINUM: Mild cardiomegaly, stable. Mitral annular calcifications. Aortic atherosclerosis. BONES AND SOFT TISSUES: Dextrocurvature of thoracic spine. Surgical clips over neck. No acute osseous abnormality. IMPRESSION: 1. No acute cardiopulmonary abnormality.   Stable cardiomegaly. Electronically signed by: Helayne Hurst MD MD 10/30/2024 04:21 AM EST RP Workstation: [id number]   CT Head Wo Contrast Result Date: 10/30/2024 EXAM: CT HEAD WITHOUT CONTRAST 10/30/2024 03:41:48 AM TECHNIQUE: CT of the head was performed without the administration of intravenous contrast. Automated exposure control, iterative reconstruction, and/or weight based adjustment of the mA/kV was utilized to reduce the radiation dose to as low as reasonably achievable. COMPARISON: MRI head 02/02/2024. CLINICAL HISTORY: headache high blood pressure FINDINGS: BRAIN AND VENTRICLES: No acute hemorrhage. No evidence of acute infarct. No hydrocephalus. No extra-axial collection.  No mass effect or midline shift. Periventricular white matter changes, likely sequela of chronic small vessel ischemic disease. Stable 2.7 x 1.6 cm arachnoid cyst in left middle cranial fossa. Atherosclerotic calcifications are present within the cavernous internal carotid arteries. ORBITS: Bilateral lens replacements. SINUSES: No acute abnormality. SOFT TISSUES AND SKULL: No acute soft tissue abnormality. No skull fracture. IMPRESSION: 1. No acute intracranial abnormality. Electronically signed by: Morgane Naveau MD MD 10/30/2024 04:01 AM EST RP Workstation: [id number]     Procedures   Medications Ordered in the ED  furosemide  (LASIX ) injection 40 mg (40 mg Intravenous Given 10/30/24 0540)                                    Medical Decision Making Amount and/or Complexity of Data Reviewed Labs: ordered. Radiology: ordered.  Risk Prescription drug management.   TSH is elevated likely related to stable synthroid  dose s/p thyroidectomy. No endocrinologist on call, pendint T3/4, will message for them to make changes as her next appointment with them is not until February. Added surgery team (appt on Tuesday for follow up) as well.   Patient BNP slightly elevated, likely explaining some of the BP, edema and distension. Gave her the option of starting increased diuresis here vs at home and she chose here. No sob, chest pain or other concerns to think she might need to be admitted. Trop slightly high likely related to fluid overload.  Second 1 flat low suspicion for ACS or ongoing myocardial injury.  Started diuresis here and urinated more than 2400 cc while here.  Blood pressure improved without any true antihypertensives maybe get some the fluid off helped and then the Norvasc  that she took prior to coming in.  Overall I low suspicion for endorgan damage related to her blood pressures.  I think her blood pressures are probably secondary to the fluid overload which may be related to the surgery,  thyroid  and her known history of heart failure.  She will continue increased diuresis at home.  She will keep monitoring her blood pressures for follow-up and take amlodipine  5 mg daily as needed as prescribed by  her doctor.  I had long discussion with her and her husband about signs that necessitate return to the ER especially in an association with high blood pressure.  Final diagnoses:  Hypertension, unspecified type  Edema, unspecified type  Acquired hypothyroidism    ED Discharge Orders     None          Ameria Sanjurjo, Selinda, MD 10/30/24 2315  "

## 2024-10-31 LAB — T3, FREE: T3, Free: 1.5 pg/mL — ABNORMAL LOW (ref 2.0–4.4)

## 2024-11-01 ENCOUNTER — Other Ambulatory Visit: Payer: Self-pay | Admitting: Nurse Practitioner

## 2024-11-01 ENCOUNTER — Telehealth: Payer: Self-pay | Admitting: Nurse Practitioner

## 2024-11-01 MED ORDER — LEVOTHYROXINE SODIUM 100 MCG PO TABS: 100.0000 ug | ORAL_TABLET | Freq: Every day | ORAL | 1 refills | Status: AC

## 2024-11-01 NOTE — Telephone Encounter (Signed)
 Pt states she was in the ER - please review regarding labs.

## 2024-11-01 NOTE — Telephone Encounter (Signed)
 Looks like she could use an increase in her thyroid  medication dosage.  Can you confirm she is on Levothyroxine  88 mcg?  That she is taking it properly and hasn't missed any doses?  Any recent steroids?

## 2024-11-01 NOTE — Telephone Encounter (Signed)
 Patient was called ,  patient denies that she has missed any medications and she has not been on any steroids. She shares that her BP was high on Friday, and it kept going up. PCP called in Amlodipine  to help lower the BP and it did not. She had some fluid on her heart and she mentions that the ED helped get that off. She has also doubled up on her fluid medication.  Per Whitney's response, I shared with the patient that Benton had shared that this was not uncommon after having thyroid  removed. She increased her Thyroid  medication to 100 mcg. Patent already has appointment on 12/15/2024. She will have repeat lab work 1 week before.   Patient was advised that if she should have any other concerns or questions between today and her office visit to call our office.

## 2024-12-13 ENCOUNTER — Ambulatory Visit: Admitting: Urology

## 2024-12-15 ENCOUNTER — Ambulatory Visit: Admitting: Nurse Practitioner

## 2024-12-20 ENCOUNTER — Ambulatory Visit: Admitting: Internal Medicine

## 2025-01-25 ENCOUNTER — Other Ambulatory Visit (HOSPITAL_COMMUNITY)

## 2025-02-16 ENCOUNTER — Ambulatory Visit: Admitting: Nurse Practitioner

## 2025-07-28 ENCOUNTER — Ambulatory Visit: Admitting: Neurology
